# Patient Record
Sex: Female | Born: 1955 | Race: White | Hispanic: No | Marital: Married | Smoking: Former smoker
Health system: Southern US, Community
[De-identification: ages and names within clinical notes are randomized; demographics above are authoritative.]

## PROBLEM LIST (undated history)

## (undated) DIAGNOSIS — I471 Supraventricular tachycardia, unspecified: Secondary | ICD-10-CM

## (undated) DIAGNOSIS — Z87442 Personal history of urinary calculi: Secondary | ICD-10-CM

## (undated) DIAGNOSIS — I10 Essential (primary) hypertension: Secondary | ICD-10-CM

## (undated) DIAGNOSIS — K219 Gastro-esophageal reflux disease without esophagitis: Secondary | ICD-10-CM

## (undated) DIAGNOSIS — F329 Major depressive disorder, single episode, unspecified: Secondary | ICD-10-CM

## (undated) DIAGNOSIS — F32A Depression, unspecified: Secondary | ICD-10-CM

## (undated) DIAGNOSIS — M5030 Other cervical disc degeneration, unspecified cervical region: Secondary | ICD-10-CM

## (undated) DIAGNOSIS — E119 Type 2 diabetes mellitus without complications: Secondary | ICD-10-CM

## (undated) DIAGNOSIS — M1990 Unspecified osteoarthritis, unspecified site: Secondary | ICD-10-CM

## (undated) DIAGNOSIS — C44310 Basal cell carcinoma of skin of unspecified parts of face: Secondary | ICD-10-CM

## (undated) DIAGNOSIS — E7800 Pure hypercholesterolemia, unspecified: Secondary | ICD-10-CM

## (undated) DIAGNOSIS — M5126 Other intervertebral disc displacement, lumbar region: Secondary | ICD-10-CM

## (undated) DIAGNOSIS — I499 Cardiac arrhythmia, unspecified: Secondary | ICD-10-CM

## (undated) DIAGNOSIS — M797 Fibromyalgia: Secondary | ICD-10-CM

## (undated) DIAGNOSIS — M51369 Other intervertebral disc degeneration, lumbar region without mention of lumbar back pain or lower extremity pain: Secondary | ICD-10-CM

## (undated) DIAGNOSIS — I219 Acute myocardial infarction, unspecified: Secondary | ICD-10-CM

## (undated) DIAGNOSIS — K644 Residual hemorrhoidal skin tags: Secondary | ICD-10-CM

## (undated) DIAGNOSIS — F419 Anxiety disorder, unspecified: Secondary | ICD-10-CM

## (undated) DIAGNOSIS — N816 Rectocele: Secondary | ICD-10-CM

## (undated) DIAGNOSIS — M5136 Other intervertebral disc degeneration, lumbar region: Secondary | ICD-10-CM

## (undated) DIAGNOSIS — N390 Urinary tract infection, site not specified: Secondary | ICD-10-CM

## (undated) DIAGNOSIS — IMO0002 Reserved for concepts with insufficient information to code with codable children: Secondary | ICD-10-CM

## (undated) DIAGNOSIS — G4730 Sleep apnea, unspecified: Secondary | ICD-10-CM

## (undated) DIAGNOSIS — Q8909 Congenital malformations of spleen: Secondary | ICD-10-CM

## (undated) HISTORY — DX: Type 2 diabetes mellitus without complications: E11.9

## (undated) HISTORY — DX: Rectocele: N81.6

## (undated) HISTORY — DX: Congenital malformations of spleen: Q89.09

## (undated) HISTORY — DX: Anxiety disorder, unspecified: F41.9

## (undated) HISTORY — DX: Pure hypercholesterolemia, unspecified: E78.00

## (undated) HISTORY — DX: Acute myocardial infarction, unspecified: I21.9

## (undated) HISTORY — DX: Essential (primary) hypertension: I10

## (undated) HISTORY — DX: Sleep apnea, unspecified: G47.30

## (undated) HISTORY — PX: CARDIAC CATHETERIZATION: SHX172

## (undated) HISTORY — DX: Unspecified osteoarthritis, unspecified site: M19.90

## (undated) HISTORY — PX: BASAL CELL CARCINOMA EXCISION: SHX1214

## (undated) HISTORY — PX: OTHER SURGICAL HISTORY: SHX169

## (undated) HISTORY — DX: Cardiac arrhythmia, unspecified: I49.9

## (undated) HISTORY — DX: Residual hemorrhoidal skin tags: K64.4

## (undated) HISTORY — DX: Basal cell carcinoma of skin of unspecified parts of face: C44.310

## (undated) HISTORY — DX: Other cervical disc degeneration, unspecified cervical region: M50.30

## (undated) HISTORY — DX: Urinary tract infection, site not specified: N39.0

## (undated) HISTORY — PX: VAGINAL HYSTERECTOMY: SUR661

## (undated) HISTORY — DX: Reserved for concepts with insufficient information to code with codable children: IMO0002

## (undated) HISTORY — PX: KIDNEY STONE SURGERY: SHX686

---

## 2002-09-25 HISTORY — PX: OTHER SURGICAL HISTORY: SHX169

## 2012-09-25 HISTORY — PX: COSMETIC SURGERY: SHX468

## 2013-01-24 ENCOUNTER — Ambulatory Visit: Payer: Self-pay | Admitting: Emergency Medicine

## 2013-02-04 ENCOUNTER — Ambulatory Visit: Payer: Self-pay

## 2013-02-04 LAB — CBC WITH DIFFERENTIAL/PLATELET
Basophil #: 0 10*3/uL (ref 0.0–0.1)
Eosinophil #: 0.4 10*3/uL (ref 0.0–0.7)
HGB: 11.3 g/dL — ABNORMAL LOW (ref 12.0–16.0)
Lymphocyte #: 2.6 10*3/uL (ref 1.0–3.6)
Monocyte #: 0.8 x10 3/mm (ref 0.2–0.9)
Monocyte %: 10.9 %
Neutrophil #: 3.3 10*3/uL (ref 1.4–6.5)
Neutrophil %: 46.4 %
RDW: 12.7 % (ref 11.5–14.5)

## 2013-02-04 LAB — COMPREHENSIVE METABOLIC PANEL
Albumin: 3.9 g/dL (ref 3.4–5.0)
Anion Gap: 8 (ref 7–16)
BUN: 15 mg/dL (ref 7–18)
Co2: 30 mmol/L (ref 21–32)
Creatinine: 1.15 mg/dL (ref 0.60–1.30)
EGFR (Non-African Amer.): 54 — ABNORMAL LOW
Osmolality: 282 (ref 275–301)
Potassium: 3.4 mmol/L — ABNORMAL LOW (ref 3.5–5.1)
SGOT(AST): 23 U/L (ref 15–37)
SGPT (ALT): 24 U/L (ref 12–78)
Sodium: 141 mmol/L (ref 136–145)
Total Protein: 7.4 g/dL (ref 6.4–8.2)

## 2013-02-04 LAB — MAGNESIUM: Magnesium: 1.7 mg/dL — ABNORMAL LOW

## 2013-02-06 DIAGNOSIS — M169 Osteoarthritis of hip, unspecified: Secondary | ICD-10-CM | POA: Insufficient documentation

## 2013-02-13 DIAGNOSIS — E119 Type 2 diabetes mellitus without complications: Secondary | ICD-10-CM | POA: Insufficient documentation

## 2013-02-13 DIAGNOSIS — Z8349 Family history of other endocrine, nutritional and metabolic diseases: Secondary | ICD-10-CM | POA: Insufficient documentation

## 2013-04-17 ENCOUNTER — Ambulatory Visit: Payer: Self-pay | Admitting: Internal Medicine

## 2013-05-10 ENCOUNTER — Ambulatory Visit: Payer: Self-pay | Admitting: Family Medicine

## 2013-05-10 LAB — CBC WITH DIFFERENTIAL/PLATELET
Basophil #: 0 10*3/uL (ref 0.0–0.1)
Basophil %: 0.4 %
Eosinophil #: 0.5 10*3/uL (ref 0.0–0.7)
HGB: 12.1 g/dL (ref 12.0–16.0)
Lymphocyte %: 37.5 %
MCH: 30.6 pg (ref 26.0–34.0)
MCHC: 34.2 g/dL (ref 32.0–36.0)
Neutrophil #: 3.4 10*3/uL (ref 1.4–6.5)
RBC: 3.94 10*6/uL (ref 3.80–5.20)
WBC: 7.1 10*3/uL (ref 3.6–11.0)

## 2013-05-10 LAB — COMPREHENSIVE METABOLIC PANEL
Albumin: 4.3 g/dL (ref 3.4–5.0)
Alkaline Phosphatase: 49 U/L — ABNORMAL LOW (ref 50–136)
BUN: 25 mg/dL — ABNORMAL HIGH (ref 7–18)
Bilirubin,Total: 0.3 mg/dL (ref 0.2–1.0)
Calcium, Total: 9.9 mg/dL (ref 8.5–10.1)
Co2: 31 mmol/L (ref 21–32)
EGFR (African American): 46 — ABNORMAL LOW
EGFR (Non-African Amer.): 40 — ABNORMAL LOW
Potassium: 4 mmol/L (ref 3.5–5.1)
SGOT(AST): 20 U/L (ref 15–37)
Sodium: 139 mmol/L (ref 136–145)

## 2013-05-10 LAB — URINALYSIS, COMPLETE
Ph: 8 (ref 4.5–8.0)
Protein: NEGATIVE
Specific Gravity: 1.01 (ref 1.003–1.030)

## 2013-05-10 LAB — OCCULT BLOOD X 1 CARD TO LAB, STOOL: Occult Blood, Feces: NEGATIVE

## 2013-05-12 LAB — STOOL CULTURE

## 2013-06-26 ENCOUNTER — Ambulatory Visit: Payer: Self-pay

## 2013-06-26 ENCOUNTER — Emergency Department: Payer: Self-pay | Admitting: Emergency Medicine

## 2013-08-17 ENCOUNTER — Emergency Department: Payer: Self-pay | Admitting: Emergency Medicine

## 2013-08-17 LAB — CBC
HCT: 36.6 % (ref 35.0–47.0)
HGB: 12.6 g/dL (ref 12.0–16.0)
MCHC: 34.3 g/dL (ref 32.0–36.0)
MCV: 90 fL (ref 80–100)
Platelet: 330 10*3/uL (ref 150–440)
WBC: 12.7 10*3/uL — ABNORMAL HIGH (ref 3.6–11.0)

## 2013-08-17 LAB — COMPREHENSIVE METABOLIC PANEL
Albumin: 4.1 g/dL (ref 3.4–5.0)
BUN: 25 mg/dL — ABNORMAL HIGH (ref 7–18)
Bilirubin,Total: 0.3 mg/dL (ref 0.2–1.0)
Calcium, Total: 9.3 mg/dL (ref 8.5–10.1)
Chloride: 106 mmol/L (ref 98–107)
Co2: 28 mmol/L (ref 21–32)
EGFR (African American): 49 — ABNORMAL LOW
EGFR (Non-African Amer.): 43 — ABNORMAL LOW
Potassium: 3.2 mmol/L — ABNORMAL LOW (ref 3.5–5.1)
SGOT(AST): 28 U/L (ref 15–37)
SGPT (ALT): 27 U/L (ref 12–78)
Sodium: 139 mmol/L (ref 136–145)
Total Protein: 7.7 g/dL (ref 6.4–8.2)

## 2013-09-01 ENCOUNTER — Emergency Department: Payer: Self-pay | Admitting: Emergency Medicine

## 2013-09-01 LAB — COMPREHENSIVE METABOLIC PANEL
Albumin: 3.9 g/dL (ref 3.4–5.0)
Alkaline Phosphatase: 37 U/L — ABNORMAL LOW
BUN: 25 mg/dL — ABNORMAL HIGH (ref 7–18)
Calcium, Total: 9.5 mg/dL (ref 8.5–10.1)
EGFR (Non-African Amer.): 52 — ABNORMAL LOW
Glucose: 87 mg/dL (ref 65–99)
Osmolality: 281 (ref 275–301)
SGPT (ALT): 19 U/L (ref 12–78)
Sodium: 139 mmol/L (ref 136–145)
Total Protein: 7.6 g/dL (ref 6.4–8.2)

## 2013-09-01 LAB — CK TOTAL AND CKMB (NOT AT ARMC): CK-MB: 0.7 ng/mL (ref 0.5–3.6)

## 2013-09-01 LAB — URINALYSIS, COMPLETE
Bacteria: NONE SEEN
Bilirubin,UR: NEGATIVE
Blood: NEGATIVE
Glucose,UR: NEGATIVE mg/dL (ref 0–75)
Ketone: NEGATIVE
Nitrite: NEGATIVE
Ph: 7 (ref 4.5–8.0)
Squamous Epithelial: 1
WBC UR: 5 /HPF (ref 0–5)

## 2013-09-01 LAB — CBC
MCH: 31.2 pg (ref 26.0–34.0)
MCHC: 34.6 g/dL (ref 32.0–36.0)
RBC: 3.84 10*6/uL (ref 3.80–5.20)
RDW: 13.6 % (ref 11.5–14.5)
WBC: 7.1 10*3/uL (ref 3.6–11.0)

## 2013-09-01 LAB — TROPONIN I: Troponin-I: 0.02 ng/mL

## 2014-04-04 ENCOUNTER — Emergency Department: Payer: Self-pay | Admitting: Emergency Medicine

## 2014-04-30 ENCOUNTER — Ambulatory Visit: Payer: Self-pay | Admitting: Internal Medicine

## 2014-06-15 ENCOUNTER — Emergency Department: Payer: Self-pay | Admitting: Emergency Medicine

## 2014-07-06 ENCOUNTER — Ambulatory Visit: Payer: Self-pay | Admitting: Gastroenterology

## 2014-07-07 ENCOUNTER — Emergency Department: Payer: Self-pay | Admitting: Emergency Medicine

## 2014-07-07 LAB — CBC WITH DIFFERENTIAL/PLATELET
Basophil #: 0 10*3/uL (ref 0.0–0.1)
Basophil %: 0.3 %
EOS ABS: 0.3 10*3/uL (ref 0.0–0.7)
EOS PCT: 4.1 %
HCT: 36.6 % (ref 35.0–47.0)
HGB: 11.7 g/dL — ABNORMAL LOW (ref 12.0–16.0)
LYMPHS PCT: 34.8 %
Lymphocyte #: 2.9 10*3/uL (ref 1.0–3.6)
MCH: 28.3 pg (ref 26.0–34.0)
MCHC: 32.1 g/dL (ref 32.0–36.0)
MCV: 88 fL (ref 80–100)
MONOS PCT: 8.2 %
Monocyte #: 0.7 x10 3/mm (ref 0.2–0.9)
Neutrophil #: 4.4 10*3/uL (ref 1.4–6.5)
Neutrophil %: 52.6 %
Platelet: 292 10*3/uL (ref 150–440)
RBC: 4.15 10*6/uL (ref 3.80–5.20)
RDW: 13.7 % (ref 11.5–14.5)
WBC: 8.4 10*3/uL (ref 3.6–11.0)

## 2014-07-07 LAB — URINALYSIS, COMPLETE
BLOOD: NEGATIVE
Bilirubin,UR: NEGATIVE
Glucose,UR: NEGATIVE mg/dL (ref 0–75)
KETONE: NEGATIVE
LEUKOCYTE ESTERASE: NEGATIVE
NITRITE: NEGATIVE
PH: 5 (ref 4.5–8.0)
PROTEIN: NEGATIVE
RBC,UR: 2 /HPF (ref 0–5)
Specific Gravity: 1.026 (ref 1.003–1.030)

## 2014-07-07 LAB — TROPONIN I: Troponin-I: <0.02 ng/mL

## 2014-07-07 LAB — COMPREHENSIVE METABOLIC PANEL
ALK PHOS: 60 U/L
Albumin: 3.6 g/dL (ref 3.4–5.0)
Anion Gap: 10 (ref 7–16)
BUN: 21 mg/dL — AB (ref 7–18)
Bilirubin,Total: 0.3 mg/dL (ref 0.2–1.0)
Calcium, Total: 8.5 mg/dL (ref 8.5–10.1)
Chloride: 107 mmol/L (ref 98–107)
Co2: 26 mmol/L (ref 21–32)
Creatinine: 1.05 mg/dL (ref 0.60–1.30)
EGFR (African American): >60
GFR CALC NON AF AMER: 58 — AB
GLUCOSE: 102 mg/dL — AB (ref 65–99)
Osmolality: 288 (ref 275–301)
POTASSIUM: 3.2 mmol/L — AB (ref 3.5–5.1)
SGOT(AST): 18 U/L (ref 15–37)
SGPT (ALT): 21 U/L
Sodium: 143 mmol/L (ref 136–145)
Total Protein: 6.9 g/dL (ref 6.4–8.2)

## 2014-07-07 LAB — D-DIMER(ARMC): D-Dimer: 583 ng/ml

## 2014-07-07 LAB — CK TOTAL AND CKMB (NOT AT ARMC)
CK, Total: 104 U/L
CK-MB: 1.5 ng/mL (ref 0.5–3.6)

## 2014-07-07 LAB — PROTIME-INR
INR: 1
Prothrombin Time: 13 secs (ref 11.5–14.7)

## 2014-08-05 ENCOUNTER — Emergency Department: Payer: Self-pay | Admitting: Emergency Medicine

## 2014-09-21 ENCOUNTER — Emergency Department: Payer: Self-pay | Admitting: Emergency Medicine

## 2014-09-21 LAB — DRUG SCREEN, URINE
AMPHETAMINES, UR SCREEN: NEGATIVE (ref ?–1000)
BARBITURATES, UR SCREEN: NEGATIVE (ref ?–200)
BENZODIAZEPINE, UR SCRN: POSITIVE (ref ?–200)
CANNABINOID 50 NG, UR ~~LOC~~: NEGATIVE (ref ?–50)
Cocaine Metabolite,Ur ~~LOC~~: NEGATIVE (ref ?–300)
MDMA (Ecstasy)Ur Screen: NEGATIVE (ref ?–500)
Methadone, Ur Screen: NEGATIVE (ref ?–300)
Opiate, Ur Screen: POSITIVE (ref ?–300)
PHENCYCLIDINE (PCP) UR S: NEGATIVE (ref ?–25)
TRICYCLIC, UR SCREEN: POSITIVE (ref ?–1000)

## 2014-09-21 LAB — URINALYSIS, COMPLETE
BLOOD: NEGATIVE
Bilirubin,UR: NEGATIVE
Glucose,UR: NEGATIVE mg/dL (ref 0–75)
Ketone: NEGATIVE
Nitrite: NEGATIVE
PH: 8 (ref 4.5–8.0)
Protein: NEGATIVE
RBC,UR: 5 /HPF (ref 0–5)
SPECIFIC GRAVITY: 1.016 (ref 1.003–1.030)
WBC UR: 10 /HPF (ref 0–5)

## 2014-09-23 ENCOUNTER — Emergency Department: Payer: Self-pay | Admitting: Emergency Medicine

## 2014-09-27 ENCOUNTER — Emergency Department (HOSPITAL_COMMUNITY): Payer: PPO

## 2014-09-27 ENCOUNTER — Encounter (HOSPITAL_COMMUNITY): Payer: Self-pay | Admitting: Emergency Medicine

## 2014-09-27 ENCOUNTER — Emergency Department (HOSPITAL_COMMUNITY)
Admission: EM | Admit: 2014-09-27 | Discharge: 2014-09-27 | Disposition: A | Discharge status: Home / Self Care | Payer: PPO | Attending: Emergency Medicine | Admitting: Emergency Medicine

## 2014-09-27 DIAGNOSIS — Z7952 Long term (current) use of systemic steroids: Secondary | ICD-10-CM | POA: Insufficient documentation

## 2014-09-27 DIAGNOSIS — Z79899 Other long term (current) drug therapy: Secondary | ICD-10-CM | POA: Diagnosis not present

## 2014-09-27 DIAGNOSIS — Z87442 Personal history of urinary calculi: Secondary | ICD-10-CM | POA: Insufficient documentation

## 2014-09-27 DIAGNOSIS — I471 Supraventricular tachycardia: Secondary | ICD-10-CM | POA: Insufficient documentation

## 2014-09-27 DIAGNOSIS — M545 Low back pain, unspecified: Secondary | ICD-10-CM

## 2014-09-27 DIAGNOSIS — R109 Unspecified abdominal pain: Secondary | ICD-10-CM | POA: Insufficient documentation

## 2014-09-27 DIAGNOSIS — Z792 Long term (current) use of antibiotics: Secondary | ICD-10-CM | POA: Insufficient documentation

## 2014-09-27 DIAGNOSIS — R10A1 Flank pain, right side: Secondary | ICD-10-CM

## 2014-09-27 HISTORY — DX: Supraventricular tachycardia: I47.1

## 2014-09-27 HISTORY — DX: Supraventricular tachycardia, unspecified: I47.10

## 2014-09-27 LAB — URINALYSIS, ROUTINE W REFLEX MICROSCOPIC
Bilirubin Urine: NEGATIVE
Glucose, UA: NEGATIVE mg/dL
Hgb urine dipstick: NEGATIVE
Ketones, ur: NEGATIVE mg/dL
Nitrite: NEGATIVE
PH: 8 (ref 5.0–8.0)
Protein, ur: NEGATIVE mg/dL
Specific Gravity, Urine: 1.02 (ref 1.005–1.030)
Urobilinogen, UA: 0.2 mg/dL (ref 0.0–1.0)

## 2014-09-27 LAB — CBC WITH DIFFERENTIAL/PLATELET
BASOS PCT: 0 % (ref 0–1)
Basophils Absolute: 0 10*3/uL (ref 0.0–0.1)
EOS ABS: 0.4 10*3/uL (ref 0.0–0.7)
EOS PCT: 5 % (ref 0–5)
HCT: 43.3 % (ref 36.0–46.0)
HEMOGLOBIN: 14.4 g/dL (ref 12.0–15.0)
Lymphocytes Relative: 30 % (ref 12–46)
Lymphs Abs: 2.9 10*3/uL (ref 0.7–4.0)
MCH: 29 pg (ref 26.0–34.0)
MCHC: 33.3 g/dL (ref 30.0–36.0)
MCV: 87.1 fL (ref 78.0–100.0)
MONOS PCT: 8 % (ref 3–12)
Monocytes Absolute: 0.8 10*3/uL (ref 0.1–1.0)
Neutro Abs: 5.5 10*3/uL (ref 1.7–7.7)
Neutrophils Relative %: 57 % (ref 43–77)
PLATELETS: 317 10*3/uL (ref 150–400)
RBC: 4.97 MIL/uL (ref 3.87–5.11)
RDW: 13.6 % (ref 11.5–15.5)
WBC: 9.7 10*3/uL (ref 4.0–10.5)

## 2014-09-27 LAB — COMPREHENSIVE METABOLIC PANEL
ALT: 20 U/L (ref 0–35)
AST: 29 U/L (ref 0–37)
Albumin: 4.3 g/dL (ref 3.5–5.2)
Alkaline Phosphatase: 53 U/L (ref 39–117)
Anion gap: 11 (ref 5–15)
BILIRUBIN TOTAL: 0.4 mg/dL (ref 0.3–1.2)
BUN: 18 mg/dL (ref 6–23)
CALCIUM: 10.1 mg/dL (ref 8.4–10.5)
CO2: 26 mmol/L (ref 19–32)
CREATININE: 1.13 mg/dL — AB (ref 0.50–1.10)
Chloride: 101 mEq/L (ref 96–112)
GFR, EST AFRICAN AMERICAN: 61 mL/min — AB (ref >90)
GFR, EST NON AFRICAN AMERICAN: 53 mL/min — AB (ref >90)
Glucose, Bld: 102 mg/dL — ABNORMAL HIGH (ref 70–99)
Potassium: 3.9 mmol/L (ref 3.5–5.1)
SODIUM: 138 mmol/L (ref 135–145)
Total Protein: 7.7 g/dL (ref 6.0–8.3)

## 2014-09-27 LAB — I-STAT TROPONIN, ED: TROPONIN I, POC: 0 ng/mL (ref 0.00–0.08)

## 2014-09-27 LAB — URINE MICROSCOPIC-ADD ON

## 2014-09-27 MED ORDER — KETOROLAC TROMETHAMINE 15 MG/ML IJ SOLN
15.0000 mg | Freq: Once | INTRAMUSCULAR | Status: AC
  Administered 2014-09-27: 15 mg via INTRAVENOUS
  Filled 2014-09-27: qty 1

## 2014-09-27 MED ORDER — HYDROMORPHONE HCL 1 MG/ML IJ SOLN
1.0000 mg | Freq: Once | INTRAMUSCULAR | Status: AC
  Administered 2014-09-27: 1 mg via INTRAVENOUS
  Filled 2014-09-27: qty 1

## 2014-09-27 MED ORDER — HYDROMORPHONE HCL 1 MG/ML IJ SOLN: 1.0000 mg | Freq: Once | INTRAMUSCULAR | Status: DC

## 2014-09-27 MED ORDER — SODIUM CHLORIDE 0.9 % IV BOLUS (SEPSIS)
1000.0000 mL | Freq: Once | INTRAVENOUS | Status: AC
  Administered 2014-09-27: 1000 mL via INTRAVENOUS

## 2014-09-27 MED ORDER — OXYCODONE-ACETAMINOPHEN 5-325 MG PO TABS
1.0000 | ORAL_TABLET | Freq: Once | ORAL | Status: AC
  Administered 2014-09-27: 1 via ORAL
  Filled 2014-09-27: qty 1

## 2014-09-27 MED ORDER — OXYCODONE-ACETAMINOPHEN 5-325 MG PO TABS: 1.0000 | ORAL_TABLET | Freq: Four times a day (QID) | ORAL | Status: DC | PRN

## 2014-09-27 NOTE — ED Provider Notes (Signed)
CSN: [id number]     Arrival date & time 09/27/14  1154 History   None    Chief Complaint  Patient presents with  . Back Pain  . Flank Pain  . Palpitations     (Consider location/radiation/quality/duration/timing/severity/associated sxs/prior Treatment) Patient is a 59 y.o. female presenting with back pain, flank pain, and palpitations. The history is provided by the patient and the spouse.  Back Pain Location:  Lumbar spine Quality:  Shooting (sharp) Radiates to: right flank. Pain severity:  Severe Pain is:  Same all the time Onset quality:  Gradual Duration:  4 days Timing:  Constant Progression:  Worsening Chronicity:  Recurrent (patient has hx of back pain and kidney stones and states this pain feels the same as her previous kidney stones) Relieved by:  Nothing Exacerbated by: urination. Associated symptoms: dysuria (sharp pain on urination similar to previous stones, no burning )   Associated symptoms: no abdominal pain, no bladder incontinence, no bowel incontinence, no chest pain, no fever, no headaches, no leg pain, no perianal numbness and no weakness   Flank Pain Pertinent negatives include no abdominal pain, arthralgias, chest pain, chills, coughing, diaphoresis, fatigue, fever, headaches, myalgias, nausea, rash, sore throat, vomiting or weakness.  Palpitations Palpitations quality:  Regular Onset quality:  Gradual Duration:  1 day Timing:  Constant Chronicity:  Recurrent Associated symptoms: back pain   Associated symptoms: no chest pain, no cough, no diaphoresis, no dizziness, no leg pain, no nausea, no shortness of breath and no vomiting     Past Medical History  Diagnosis Date  . Kidney stone   . SVT (supraventricular tachycardia)    History reviewed. No pertinent past surgical history. History reviewed. No pertinent family history. History  Substance Use Topics  . Smoking status: Never Smoker   . Smokeless tobacco: Not on file  . Alcohol Use: No   OB  History    No data available     Review of Systems  Constitutional: Negative for fever, chills, diaphoresis, activity change, appetite change and fatigue.  HENT: Negative for facial swelling, rhinorrhea, sore throat, trouble swallowing and voice change.   Eyes: Negative for photophobia, pain and visual disturbance.  Respiratory: Negative for cough, shortness of breath, wheezing and stridor.   Cardiovascular: Positive for palpitations. Negative for chest pain and leg swelling.  Gastrointestinal: Negative for nausea, vomiting, abdominal pain, constipation, anal bleeding and bowel incontinence.  Endocrine: Negative.   Genitourinary: Positive for dysuria (sharp pain on urination similar to previous stones, no burning ) and flank pain. Negative for bladder incontinence, vaginal bleeding, vaginal discharge and vaginal pain.  Musculoskeletal: Positive for back pain. Negative for myalgias and arthralgias.  Skin: Negative.  Negative for rash.  Allergic/Immunologic: Negative.   Neurological: Negative for dizziness, tremors, syncope, weakness and headaches.  Psychiatric/Behavioral: Negative for suicidal ideas, sleep disturbance and self-injury.  All other systems reviewed and are negative.     Allergies  Demerol; Ivp dye; and Sulfa antibiotics  Home Medications   Prior to Admission medications   Medication Sig Start Date End Date Taking? Authorizing Provider  atenolol (TENORMIN) 25 MG tablet Take 25 mg by mouth daily. 09/20/14   Historical Provider, MD  clonazePAM (KLONOPIN) 0.5 MG tablet Take 0.5 mg by mouth 3 (three) times daily as needed. 09/21/14   Historical Provider, MD  fenofibrate (TRICOR) 145 MG tablet Take 145 mg by mouth daily. 08/06/14   Historical Provider, MD  gabapentin (NEURONTIN) 300 MG capsule Take 300 mg by mouth 3 (three)  times daily. 08/06/14   Historical Provider, MD  HYDROcodone-acetaminophen (NORCO) 10-325 MG per tablet Take 1 tablet by mouth 2 (two) times daily as  needed. 08/15/14   Historical Provider, MD  KLOR-CON M20 20 MEQ tablet Take 20 mEq by mouth daily. 08/06/14   Historical Provider, MD  losartan (COZAAR) 50 MG tablet Take 50 mg by mouth daily. 09/01/14   Historical Provider, MD  metFORMIN (GLUCOPHAGE-XR) 500 MG 24 hr tablet Take 1,000 mg by mouth daily. 08/06/14   Historical Provider, MD  methocarbamol (ROBAXIN) 750 MG tablet Take 750 mg by mouth every 4 (four) hours as needed. 09/24/14   Historical Provider, MD  niacin (NIASPAN) 500 MG CR tablet Take 500 mg by mouth at bedtime. 08/15/14   Historical Provider, MD  nitrofurantoin, macrocrystal-monohydrate, (MACROBID) 100 MG capsule Take 100 mg by mouth 2 (two) times daily. 09/22/14   Historical Provider, MD  nortriptyline (PAMELOR) 75 MG capsule Take 150 mg by mouth every evening. 09/15/14   Historical Provider, MD  oxyCODONE-acetaminophen (PERCOCET/ROXICET) 5-325 MG per tablet Take 1 tablet by mouth every 6 (six) hours as needed for severe pain. 09/27/14   Margaretann Loveless, MD  predniSONE (DELTASONE) 20 MG tablet Take 20 mg by mouth 2 (two) times daily. 09/22/14   Historical Provider, MD  promethazine (PHENERGAN) 25 MG tablet Take 25 mg by mouth every 6 (six) hours as needed. 09/21/14   Historical Provider, MD  sertraline (ZOLOFT) 50 MG tablet Take 50 mg by mouth daily. 08/21/14   Historical Provider, MD  temazepam (RESTORIL) 30 MG capsule Take 30 mg by mouth at bedtime. 09/21/14   Historical Provider, MD  triamterene-hydrochlorothiazide (MAXZIDE-25) 37.5-25 MG per tablet Take 1 tablet by mouth every morning. 08/06/14   Historical Provider, MD   BP 123/70 mmHg  Pulse 86  Temp(Src) 98.6 F (37 C) (Oral)  Resp 18  SpO2 98% Physical Exam  Constitutional: She is oriented to person, place, and time. She appears well-developed and well-nourished. No distress.  HENT:  Head: Normocephalic and atraumatic.  Right Ear: External ear normal.  Left Ear: External ear normal.  Mouth/Throat: Oropharynx is clear  and moist. No oropharyngeal exudate.  Eyes: Conjunctivae and EOM are normal. Pupils are equal, round, and reactive to light. No scleral icterus.  Neck: Normal range of motion. Neck supple. No JVD present. No tracheal deviation present. No thyromegaly present.  Cardiovascular: Normal rate, regular rhythm and intact distal pulses.  Exam reveals no gallop and no friction rub.   No murmur heard. Pulmonary/Chest: Effort normal and breath sounds normal. No respiratory distress. She has no wheezes. She has no rales.  Abdominal: Soft. Bowel sounds are normal. She exhibits no distension. There is tenderness (Right sided CVA tenderness). There is no rebound and no guarding.  Musculoskeletal: Normal range of motion. She exhibits tenderness (right paraspinal lumbar back tenderness). She exhibits no edema.  Neurological: She is alert and oriented to person, place, and time. No cranial nerve deficit. She exhibits normal muscle tone. Coordination normal.  5/5 strength in all 4 extremities. Normal Gait. No saddle anesthesia.   Skin: Skin is warm and dry. She is not diaphoretic. No pallor.  Psychiatric: She has a normal mood and affect. She expresses no homicidal and no suicidal ideation. She expresses no suicidal plans and no homicidal plans.  Nursing note and vitals reviewed.   ED Course  Procedures (including critical care time) Labs Review Labs Reviewed  COMPREHENSIVE METABOLIC PANEL - Abnormal; Notable for the following:  Glucose, Bld 102 (*)    Creatinine, Ser 1.13 (*)    GFR calc non Af Amer 53 (*)    GFR calc Af Amer 61 (*)    All other components within normal limits  URINALYSIS, ROUTINE W REFLEX MICROSCOPIC - Abnormal; Notable for the following:    Leukocytes, UA MODERATE (*)    All other components within normal limits  URINE MICROSCOPIC-ADD ON - Abnormal; Notable for the following:    Squamous Epithelial / LPF FEW (*)    All other components within normal limits  CBC WITH DIFFERENTIAL   Randolm Idol, ED    Imaging Review US Renal  09/27/2014   CLINICAL DATA:  Four day history of right flank pain. Prior history of urinary tract calculi.  EXAM: RENAL/URINARY TRACT ULTRASOUND COMPLETE  COMPARISON:  Visualized kidneys on lumbar spine CT 09/23/2014.  FINDINGS: Right Kidney:  Length: Approximately 11.8 cm. No hydronephrosis. Well-preserved cortex. No shadowing calculi. Normal parenchymal echotexture. No focal parenchymal abnormality. 4 mm hyperechoic focus in the central kidney is felt to represent fat, as a calculus was not visible on the CT performed 5 days ago.  Left Kidney:  Length: Approximately 11.6 cm. No hydronephrosis. Well-preserved cortex. No shadowing calculi. Normal parenchymal echotexture. No focal parenchymal abnormality.  Bladder:  Relatively decompressed and normal in appearance. Bilateral ureteral jets visualized a color Doppler evaluation.  IMPRESSION: Normal examination.   Electronically Signed   By: Evangeline Dakin M.D.   On: 09/27/2014 16:35     EKG Interpretation None      MDM   Final diagnoses:  Right flank pain  Right-sided low back pain without sciatica    The patient is a 59 y.o. F with hx of lumbar disc compression (currently being seen by [facility]), multiple right sided kidney stones (one of which needed intervention approximately 3 years ago), and SVT who presents with multiple chief complaints including lumbar back pain, right flank pain, and palpitations. EKG shows sinus tachycardia likely due to patients pain and patient denies any SOB, CP, or neurologic symptoms. Troponin undetectable. Do not suspect ACS or other acute cardiovascular pathology. Back pain is right paraspinal, patient has no redflag symptoms or neurologic complaints and I do not suspect cord compression. Patient states this feels "the same" as her multiple previous kidney stones and cites urination as the main aggravating factor to there sharp right flank  pain.  Labs unremarkable. Renal ultrasound does not show any hydronephrosis or other acute abnormality. Patient's pain well controlled in the ED with improvement of heart rate normal sinus rhythm. Feel patient is appropriate for outpatient follow-up with neurosurgery clinic for back and urology for likely kidney stone.  I estimate there is LOW risk for ACUTE APPENDICITIS, BOWEL OBSTRUCTION, CHOLECYSTITIS, DIVERTICULITIS, INCARCERATED HERNIA, MESENTERIC ISCHEMIA, PANCREATITIS, or PERFORATED BOWEL or ULCER, thus I consider the discharge disposition reasonable. Also, there is no evidence or peritonitis, sepsis or toxicity. We have discussed the diagnosis and risks and agree with discharging home to follow-up with their primary doctor. We also discussed returning to the Emergency Department immediately if new or worsening symptoms occur and have discussed the symptoms which are most concerning (e.g., bloody stool, fever, changing or worsening pain, vomiting) that necessitate immediate return.  Patient seen with attending, Dr. Vanita Panda, who oversaw clinical decision making.    Margaretann Loveless, MD 09/27/14 Lurena Nida  Carmin Muskrat, MD 09/28/14 Lupita Shutter

## 2014-09-27 NOTE — ED Notes (Signed)
Pt sts currently being seen for lumbar disc compression and is part of the cause of the pain in her hip/leg.

## 2014-09-27 NOTE — ED Notes (Signed)
Pt c/o right sided flank and back pain x 4 days; pt sts felt some palpitations in the waiting room with hx of SVT

## 2014-09-27 NOTE — ED Notes (Signed)
Pt c/o desire to urinate but unable to create much urine.  Used bladder scanner and discovered 250-373ml of urine in bladder.  Informed pt to let me know if she gets the urge again and we will try to urinate again.

## 2014-10-25 ENCOUNTER — Ambulatory Visit: Payer: Self-pay | Admitting: Family Medicine

## 2014-11-20 DIAGNOSIS — E782 Mixed hyperlipidemia: Secondary | ICD-10-CM | POA: Insufficient documentation

## 2014-11-20 DIAGNOSIS — F419 Anxiety disorder, unspecified: Secondary | ICD-10-CM | POA: Insufficient documentation

## 2014-11-20 DIAGNOSIS — M5136 Other intervertebral disc degeneration, lumbar region: Secondary | ICD-10-CM | POA: Insufficient documentation

## 2015-01-15 NOTE — Consult Note (Signed)
PATIENT NAME:  Erin Campos, Erin Campos MR#:  [id number] DATE OF BIRTH:  [birth year]  DATE OF CONSULTATION:  06/26/2013  REFERRING PHYSICIAN:  Delman Kitten, MD CONSULTING PHYSICIAN:  Jerene Bears, MD  REASON FOR CONSULTATION:  Laceration from dog bite.   HISTORY OF PRESENT ILLNESS: The patient is a 59 year old female who was at home around 5:00 p.m. this afternoon and gotten bitten by her small dog.  She had loss of tissue and avulsion injury with loss of tissue of her upper lip near her philtrum. She was seen at [facility] and noted to require further management beyond their capabilities and was sent to [facility] ER for closure.   ALLERGIES: DEMEROL, INTRAVENOUS PYELOGRAM DYE, KETOROLAC, LIDOCAINE AND SULFA.   PAST MEDICAL HISTORY:   1.  Supraventricular tachycardia.  2.  Hyper triglycerides.  3.  Hypercholesterolemia.  4.  Diabetes mellitus, type 2.  5.  Hypertension.  6.  Chronic back pain.  7.  Kyphosis.  8.  Spinal stenosis.   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    FAMILY HISTORY: Noncontributory.   PHYSICAL EXAMINATION: GENERAL: She is a well-nourished, well-developed female in no acute distress.  EARS: EACs are clear. TMs intact.  NOSE: Clear anteriorly.  ORAL CAVITY AND OROPHARYNX: She has missing defect of the middle of her upper lip from encompassing the majority of the entire philtrum extending into the skin of the upper lip. There is hemostasis. She has some vertical lacerations extending superior from this and some loss tissue, as well as a small amount of maceration of the inferior aspect of the wound on her upper lip. There are no additional abnormal masses or lesions or areas of concern in her intraoral area.  NECK: Supple. No lymphadenopathy.   PROCEDURE:  V to Y advancement flap closure of a laceration.   PREPROCEDURE DIAGNOSIS: Laceration of the upper lip with tissue defect.   POSTPROCEDURE DIAGNOSIS: Laceration of the upper  lip with tissue defect.    DESCRIPTION OF PROCEDURE:  After verbal consent was obtained, the patient's upper lip was prepped and draped in sterile fashion and anesthetized with 2 mL of 1% lidocaine 1:100,000 epinephrine. The area was cleaned with Betadine and then tissue rearrangement was attempted. This demonstrated a large defect of basically the entire philtrum of the upper lip and decision was made to an advancement flap of intraoral mucosa. At this time, dissection of mucosa of the remaining upper lip was made in the muscle and mucosal tissue flap was advanced using backcut of the tissue defect. This was advanced and then sutured in place in a multilayered fashion forming a Y closure. The cut edges of the native lip where the then sutured at the proper height laterally. The native lip was closed at the proper height and the vertical lacerations of the skin were closed with 5-0 nylon, 5-0 chromic was used for all other closures as well as 5-0 Vicryl for the subcutaneous tissues. At this time and bacitracin ointment was applied and the patient's was lip was visualized in the mirror. I explained the tissue defect and reconstruction.   IMPRESSION: Laceration, status post advancement flap repair.   PLAN: To follow up in one week for suture removal. She will continue bacitracin ointment, and she is going to take Augmentin.   ____________________________ Jerene Bears, MD ccv:cc D: 06/26/2013 21:40:27 ET T: 06/26/2013 22:24:54 ET JOB#: [id number]  cc: Jerene Bears, MD, <Dictator> Veva Grimley C  Pryor Ochoa MD ELECTRONICALLY SIGNED 07/09/2013 6:43

## 2015-03-19 DIAGNOSIS — I493 Ventricular premature depolarization: Secondary | ICD-10-CM | POA: Insufficient documentation

## 2015-03-31 ENCOUNTER — Ambulatory Visit: Payer: PPO | Attending: Specialist

## 2015-03-31 DIAGNOSIS — G4733 Obstructive sleep apnea (adult) (pediatric): Secondary | ICD-10-CM | POA: Diagnosis present

## 2015-03-31 DIAGNOSIS — G4761 Periodic limb movement disorder: Secondary | ICD-10-CM | POA: Insufficient documentation

## 2015-06-25 DIAGNOSIS — G4761 Periodic limb movement disorder: Secondary | ICD-10-CM | POA: Insufficient documentation

## 2015-06-25 DIAGNOSIS — J358 Other chronic diseases of tonsils and adenoids: Secondary | ICD-10-CM | POA: Insufficient documentation

## 2015-06-25 DIAGNOSIS — G43909 Migraine, unspecified, not intractable, without status migrainosus: Secondary | ICD-10-CM | POA: Insufficient documentation

## 2015-08-24 ENCOUNTER — Encounter: Payer: Self-pay | Admitting: Urology

## 2015-08-24 ENCOUNTER — Ambulatory Visit (INDEPENDENT_AMBULATORY_CARE_PROVIDER_SITE_OTHER): Payer: PPO | Admitting: Urology

## 2015-08-24 VITALS — BP 124/77 | HR 80 | Ht 67.0 in | Wt 201.5 lb

## 2015-08-24 DIAGNOSIS — N390 Urinary tract infection, site not specified: Secondary | ICD-10-CM | POA: Diagnosis not present

## 2015-08-24 DIAGNOSIS — N952 Postmenopausal atrophic vaginitis: Secondary | ICD-10-CM | POA: Diagnosis not present

## 2015-08-24 DIAGNOSIS — R3129 Other microscopic hematuria: Secondary | ICD-10-CM | POA: Diagnosis not present

## 2015-08-24 LAB — URINALYSIS, COMPLETE
Bilirubin, UA: NEGATIVE
Glucose, UA: NEGATIVE
Ketones, UA: NEGATIVE
Nitrite, UA: NEGATIVE
PH UA: 7 (ref 5.0–7.5)
Protein, UA: NEGATIVE
Specific Gravity, UA: 1.015 (ref 1.005–1.030)
Urobilinogen, Ur: 0.2 mg/dL (ref 0.2–1.0)

## 2015-08-24 LAB — MICROSCOPIC EXAMINATION
Bacteria, UA: NONE SEEN
RBC, UA: NONE SEEN /hpf (ref 0–?)

## 2015-08-24 NOTE — Progress Notes (Signed)
08/24/2015 4:23 PM   Erin Campos December 20, 1955 [id number]  Referring provider: No referring provider defined for this encounter.  Chief Complaint  Patient presents with  . Recurrent UTI    referred by Dr. 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    Her infections are herald by frequency, low back ache, headaches, chills and dysuria.  She states this started one year ago.  She was seen by her PCP and the UA was not impressive.  She was started on an antibiotic and the culture grew out Streptococcus.  Her symptoms have not abated and she has had three courses of Amoxicillin.  Her urine cultures have not been obtained by cath specimens.    She has not had episodes of gross hematuria, but she had 0-3 RBC's/hpf on three occassions.  She is a former smoker.  She does have a history of kidney stones.  She has had ureteroscopy for one of these stones.  She has a remote history of recurrent UTI's and has been evaluated by Dr. Marden Noble and Dr. Ria Comment at [facility].  She has signed a medical release for these records.  She does not recall an etiology for her recurrent UTI's from those work ups.    Her UA today was unremarkable.  She is not sexually active at this time.  She is not experiencing constipation or diarrhea.  PMH: Past Medical History  Diagnosis Date  . Kidney stone   . SVT (supraventricular tachycardia) ([place])   . Anxiety   . Arrhythmia   . Arthritis   . Facial basal cell cancer   . Spleen anomaly     compressed  . Controlled diabetes mellitus ([place])   . Heart attack ([place])   . High cholesterol   . HTN (hypertension)   . Sleep apnea     CPAP  . Recurrent UTI     Surgical History: Past Surgical History  Procedure Laterality Date  . Abdominal hysterectomy     . Heart ablation    . Kidney stone surgery      stents  . Cosmetic surgery  2014    Lip  . Basal cell carcinoma excision      face    Home Medications:    Medication List       This list is accurate as of: 08/24/15  4:23 PM.  Always use your most recent med list.               amoxicillin-clavulanate 875-125 MG tablet  Commonly known as:  AUGMENTIN  Take by mouth.     aspirin 81 MG chewable tablet  Chew by mouth.     atenolol 25 MG tablet  Commonly known as:  TENORMIN  Take 25 mg by mouth daily.     carisoprodol 350 MG tablet  Commonly known as:  SOMA  Take by mouth.     clonazePAM 0.5 MG tablet  Commonly known as:  KLONOPIN  Take 0.5 mg by mouth 3 (three) times daily as needed.     fenofibrate 145 MG tablet  Commonly known as:  TRICOR  Take 145 mg by mouth daily.     fluticasone 50 MCG/ACT nasal spray  Commonly known as:  FLONASE  Place into the nose.  gabapentin 300 MG capsule  Commonly known as:  NEURONTIN  Take 300 mg by mouth 3 (three) times daily.     glucose blood test strip     HYDROcodone-acetaminophen 10-325 MG tablet  Commonly known as:  NORCO  Take 1 tablet by mouth 2 (two) times daily as needed.     KLOR-CON M20 20 MEQ tablet  Generic drug:  potassium chloride SA  Take 20 mEq by mouth daily.     lisinopril 2.5 MG tablet  Commonly known as:  PRINIVIL,ZESTRIL  Take by mouth.     losartan 50 MG tablet  Commonly known as:  COZAAR  Take 50 mg by mouth daily.     Magnesium 500 MG Caps  Take by mouth.     magnesium oxide 400 MG tablet  Commonly known as:  MAG-OX  Take by mouth.     metFORMIN 500 MG 24 hr tablet  Commonly known as:  GLUCOPHAGE-XR  Take 1,000 mg by mouth daily.     methocarbamol 750 MG tablet  Commonly known as:  ROBAXIN  Take 750 mg by mouth every 4 (four) hours as needed.     niacin 500 MG CR tablet  Commonly known as:  NIASPAN  Take 500 mg by mouth at bedtime.     niacin 500 MG tablet  Take by mouth.      nitrofurantoin (macrocrystal-monohydrate) 100 MG capsule  Commonly known as:  MACROBID  Take 100 mg by mouth 2 (two) times daily.     nortriptyline 75 MG capsule  Commonly known as:  PAMELOR  Take 150 mg by mouth every evening.     NUCYNTA 75 MG Tabs  Generic drug:  Tapentadol HCl  Take by mouth.     oxyCODONE 5 MG immediate release tablet  Commonly known as:  Oxy IR/ROXICODONE  TAKE 1 TABLET BY ORAL ROUTE EVERY 6 HOURS AS NEEDED (DNF 07/22/15)     oxyCODONE-acetaminophen 5-325 MG tablet  Commonly known as:  PERCOCET/ROXICET  Take 1 tablet by mouth every 6 (six) hours as needed for severe pain.     predniSONE 20 MG tablet  Commonly known as:  DELTASONE  Take 20 mg by mouth 2 (two) times daily.     PROAIR HFA 108 (90 BASE) MCG/ACT inhaler  Generic drug:  albuterol  Inhale into the lungs.     promethazine 25 MG tablet  Commonly known as:  PHENERGAN  Take 25 mg by mouth every 6 (six) hours as needed.     RELPAX 20 MG tablet  Generic drug:  eletriptan  TAKE 1 TAB BY MOUTH ONCE AS NEEDED FOR MIGRAINE.IF HEADACHE RETURNS MAY TAKE 2ND DOSE AFTER 2 HOURS     rOPINIRole 0.25 MG tablet  Commonly known as:  REQUIP  TAKE 1 TABLET (0.25 MG TOTAL) BY MOUTH NIGHTLY.     sertraline 50 MG tablet  Commonly known as:  ZOLOFT  Take 50 mg by mouth daily.     temazepam 30 MG capsule  Commonly known as:  RESTORIL  Take 30 mg by mouth at bedtime.     temazepam 15 MG capsule  Commonly known as:  RESTORIL  Take 15 mg by mouth at bedtime as needed. for sleep     triamterene-hydrochlorothiazide 37.5-25 MG tablet  Commonly known as:  MAXZIDE-25  Take 1 tablet by mouth every morning.        Allergies:  Allergies  Allergen Reactions  . Demerol [Meperidine]   . Flu Virus Vaccine Other (  See Comments)    Seizure, Drawing up of hands/feet  . Ivp Dye [Iodinated Diagnostic Agents]   . Nsaids   . Sulfa Antibiotics   . Ketorolac Tromethamine Palpitations    palpitations     Family History: Family History  Problem Relation Age of Onset  . Kidney Stones Father   . Hematuria Father   . Heart failure Mother     Father    Social History:  reports that she has quit smoking. She does not have any smokeless tobacco history on file. She reports that she does not drink alcohol or use illicit drugs.  ROS: UROLOGY Frequent Urination?: Yes Hard to postpone urination?: Yes Burning/pain with urination?: Yes Get up at night to urinate?: No Leakage of urine?: No Urine stream starts and stops?: Yes Trouble starting stream?: Yes Do you have to strain to urinate?: Yes Blood in urine?: Yes Urinary tract infection?: Yes Sexually transmitted disease?: No Injury to kidneys or bladder?: No Painful intercourse?: Yes Weak stream?: Yes Currently pregnant?: No Vaginal bleeding?: No Last menstrual period?: n  Gastrointestinal Nausea?: Yes Vomiting?: No Indigestion/heartburn?: No Diarrhea?: No Constipation?: No  Constitutional Fever: Yes Night sweats?: No Weight loss?: No Fatigue?: Yes  Skin Skin rash/lesions?: No Itching?: No  Eyes Blurred vision?: No Double vision?: No  Ears/Nose/Throat Sore throat?: Yes Sinus problems?: Yes  Hematologic/Lymphatic Swollen glands?: No Easy bruising?: No  Cardiovascular Leg swelling?: No Chest pain?: No  Respiratory Cough?: Yes Shortness of breath?: No  Endocrine Excessive thirst?: No  Musculoskeletal Back pain?: Yes Joint pain?: Yes  Neurological Headaches?: Yes Dizziness?: No  Psychologic Depression?: No Anxiety?: No  Physical Exam: BP 124/77 mmHg  Pulse 80  Ht 5\' 7"  (1.702 m)  Wt 201 lb 8 oz (91.4 kg)  BMI 31.55 kg/m2  Constitutional: Well nourished. Alert and oriented, No acute distress. HEENT: [place] AT, moist mucus membranes. Trachea midline, no masses. Cardiovascular: No clubbing, cyanosis, or edema. Respiratory: Normal respiratory effort, no increased work of breathing. GI:  Abdomen is soft, non tender, non distended, no abdominal masses. Liver and spleen not palpable.  No hernias appreciated.  Stool sample for occult testing is not indicated.   GU: No CVA tenderness.  No bladder fullness or masses.  Atrophic external genitalia, normal pubic hair distribution, .  Normal urethral meatus, no lesions, no prolapse, no discharge.   No urethral masses and/or tenderness. No bladder fullness, tenderness or masses. Atrophic vaginal mucosa, areas of bleeding when examined, poor estrogen effect, no discharge, no lesions, good pelvic support, Grade II cystocele and Grade II rectocele noted.  Cervix and uterus are surgically absent.    No adnexal/parametria masses or tenderness noted.  Anus and perineum are without rashes or lesions.    Skin: No rashes, bruises or suspicious lesions. Lymph: No cervical or inguinal adenopathy. Neurologic: Grossly intact, no focal deficits, moving all 4 extremities. Psychiatric: Normal mood and affect.  Laboratory Data: Lab Results  Component Value Date   WBC 9.7 09/27/2014   HGB 14.4 09/27/2014   HCT 43.3 09/27/2014   MCV 87.1 09/27/2014   PLT 317 09/27/2014    Lab Results  Component Value Date   CREATININE 1.13* 09/27/2014    Urinalysis Results for orders placed or performed in visit on 08/24/15  Microscopic Examination  Result Value Ref Range   WBC, UA 6-10 (A) 0 -  5 /hpf   RBC, UA None seen 0 -  2 /hpf   Epithelial Cells (non renal) 0-10 0 - 10 /hpf  Renal Epithel, UA 0-10 (A) None seen /hpf   Bacteria, UA None seen None seen/Few  Urinalysis, Complete  Result Value Ref Range   Specific Gravity, UA 1.015 1.005 - 1.030   pH, UA 7.0 5.0 - 7.5   Color, UA Yellow Yellow   Appearance Ur Clear Clear   Leukocytes, UA 2+ (A) Negative   Protein, UA Negative Negative/Trace   Glucose, UA Negative Negative   Ketones, UA Negative Negative   RBC, UA Trace (A) Negative   Bilirubin, UA Negative Negative   Urobilinogen, Ur 0.2 0.2 - 1.0  mg/dL   Nitrite, UA Negative Negative   Microscopic Examination See below:    Assessment & Plan:    1. Recurrent UTI:   Patient has recurrent positive urine cultures for Beta hemolytic Streptococcus, group B treated with the appropriate antibiotics, but there is no resolution of symptoms.  I am suspicious that this may be a skin contaminate.  She does have a history of kidney stones and has not had a dedicated study in over three years.  We will obtain a CT renal stone study.  - Urinalysis, Complete   2. Microscopic hematuria:    She has had three incidences of microscopic hematuria.  Patient cannot undergo a CT Urogram due to her severe allergy to contrast.  She has had retrogrades in the past without incidence.  We will start her work up with a CT renal stone study.     3. Atrophic vaginitis:   Patient was given a sample of vaginal estrogen cream and instructed to apply 0.5mg  (pea-sized amount)  just inside the vaginal introitus with a finger-tip every night for two weeks and then Monday, Wednesday and Friday nights.  I explained to the patient that vaginally administered estrogen, which causes only a slight increase in the blood estrogen levels, have fewer contraindications and adverse systemic effects that oral HT.   Return for CT scan report.  Zara Council, [facility] [address] [place], [place] [address] [phone]

## 2015-08-26 ENCOUNTER — Encounter: Payer: Self-pay | Admitting: Obstetrics and Gynecology

## 2015-08-26 ENCOUNTER — Ambulatory Visit (INDEPENDENT_AMBULATORY_CARE_PROVIDER_SITE_OTHER): Payer: PPO | Admitting: Obstetrics and Gynecology

## 2015-08-26 ENCOUNTER — Ambulatory Visit: Payer: PPO | Admitting: Obstetrics and Gynecology

## 2015-08-26 ENCOUNTER — Telehealth: Payer: Self-pay

## 2015-08-26 VITALS — BP 133/82 | HR 81 | Ht 67.0 in | Wt 203.1 lb

## 2015-08-26 DIAGNOSIS — N8111 Cystocele, midline: Secondary | ICD-10-CM | POA: Insufficient documentation

## 2015-08-26 DIAGNOSIS — IMO0002 Reserved for concepts with insufficient information to code with codable children: Secondary | ICD-10-CM

## 2015-08-26 DIAGNOSIS — N816 Rectocele: Secondary | ICD-10-CM | POA: Diagnosis not present

## 2015-08-26 DIAGNOSIS — N8110 Cystocele, unspecified: Secondary | ICD-10-CM

## 2015-08-26 DIAGNOSIS — N941 Unspecified dyspareunia: Secondary | ICD-10-CM

## 2015-08-26 DIAGNOSIS — Z87898 Personal history of other specified conditions: Secondary | ICD-10-CM | POA: Insufficient documentation

## 2015-08-26 DIAGNOSIS — N390 Urinary tract infection, site not specified: Secondary | ICD-10-CM

## 2015-08-26 DIAGNOSIS — I10 Essential (primary) hypertension: Secondary | ICD-10-CM | POA: Insufficient documentation

## 2015-08-26 DIAGNOSIS — F319 Bipolar disorder, unspecified: Secondary | ICD-10-CM | POA: Insufficient documentation

## 2015-08-26 DIAGNOSIS — G609 Hereditary and idiopathic neuropathy, unspecified: Secondary | ICD-10-CM | POA: Insufficient documentation

## 2015-08-26 DIAGNOSIS — E876 Hypokalemia: Secondary | ICD-10-CM | POA: Insufficient documentation

## 2015-08-26 DIAGNOSIS — M25559 Pain in unspecified hip: Secondary | ICD-10-CM | POA: Insufficient documentation

## 2015-08-26 DIAGNOSIS — N952 Postmenopausal atrophic vaginitis: Secondary | ICD-10-CM | POA: Diagnosis not present

## 2015-08-26 NOTE — Progress Notes (Signed)
GYN ENCOUNTER NOTE  Subjective:       Erin Campos is a 59 y.o. [id number] female is here for gynecologic evaluation of the following issues:  1.Referral from urology for evaluation of pelvic organ prolapse (cystocele, rectocele).  The patient is a 59 year old married white female, para 2002, status post TAH/BSO for uterine fibroids and polycystic ovaries in 2004, status post bladder TACK" at the time of hysterectomy, presents in referral from urology for evaluation of cystocele and rectocele.  GYN history: Menarche age 50. Status post TAH/BSO 2004 for uterine fibroids and polycystic ovaries. ERT use for less than 1 year, subsequently discontinued. Currently having occasional vasomotor symptoms. Evaluation by urology did demonstrate vaginal atrophy.  Past OB history: Para 2002. SVD x2 Largest baby 9 lbs. 11 oz.  GU history: Frequency 3-4 times per day. Nocturia times 1 at night. No SUI symptoms. No chronic urgency symptoms. Patient does note some incomplete bladder emptying.  GI history: Bowel frequency every other day. Patient does use Colace stool softeners. Patient does splint for bowel movements.   Gynecologic History No LMP recorded. Patient has had a hysterectomy. Contraception: Status post TAH/BSO  Obstetric History OB History  Gravida Para Term Preterm AB SAB TAB Ectopic Multiple Living  2 2 2       2     # Outcome Date GA Lbr Len/2nd Weight Sex Delivery Anes PTL Lv  2 Term 1983   9 lb 4.8 oz (4.218 kg) F Vag-Spont   Y  1 Term 1981   9 lb 1.8 oz (4.132 kg) M VBAC   Y      Past Medical History  Diagnosis Date  . Kidney stone   . SVT (supraventricular tachycardia) ([place])   . Anxiety   . Arrhythmia   . Arthritis   . Facial basal cell cancer   . Spleen anomaly     compressed  . Controlled diabetes mellitus ([place])   . Heart attack ([place])   . High cholesterol   . HTN (hypertension)   . Sleep apnea     CPAP  . Recurrent UTI     Past Surgical History   Procedure Laterality Date  . Abdominal hysterectomy    . Heart ablation    . Kidney stone surgery      stents  . Cosmetic surgery  2014    Lip  . Basal cell carcinoma excision      face  . Bladder tack  2004    Current Outpatient Prescriptions on File Prior to Visit  Medication Sig Dispense Refill  . amoxicillin-clavulanate (AUGMENTIN) 875-125 MG tablet Take by mouth.    Marland Kitchen aspirin 81 MG chewable tablet Chew by mouth.    Marland Kitchen atenolol (TENORMIN) 25 MG tablet Take 25 mg by mouth daily.    . carisoprodol (SOMA) 350 MG tablet Take by mouth.    . fenofibrate (TRICOR) 145 MG tablet Take 145 mg by mouth daily.  4  . glucose blood test strip     . KLOR-CON M20 20 MEQ tablet Take 20 mEq by mouth daily.  3  . losartan (COZAAR) 50 MG tablet Take 50 mg by mouth daily.  5  . Magnesium 500 MG CAPS Take by mouth.    . niacin (NIASPAN) 500 MG CR tablet Take 500 mg by mouth at bedtime.  4  . nortriptyline (PAMELOR) 75 MG capsule Take 150 mg by mouth every evening.    Marland Kitchen oxyCODONE (OXY IR/ROXICODONE) 5 MG immediate release tablet TAKE 1  TABLET BY ORAL ROUTE EVERY 6 HOURS AS NEEDED (DNF 07/22/15)  0  . RELPAX 20 MG tablet TAKE 1 TAB BY MOUTH ONCE AS NEEDED FOR MIGRAINE.IF HEADACHE RETURNS MAY TAKE 2ND DOSE AFTER 2 HOURS  0  . temazepam (RESTORIL) 15 MG capsule Take 15 mg by mouth at bedtime as needed. for sleep  2  . triamterene-hydrochlorothiazide (MAXZIDE-25) 37.5-25 MG per tablet Take 1 tablet by mouth every morning.  3   No current facility-administered medications on file prior to visit.    Allergies  Allergen Reactions  . Demerol [Meperidine]   . Flu Virus Vaccine Other (See Comments)    Seizure, Drawing up of hands/feet  . Ivp Dye [Iodinated Diagnostic Agents]   . Nsaids   . Sulfa Antibiotics   . Ketorolac Tromethamine Palpitations    palpitations    Social History   Social History  . Marital Status: Married    Spouse Name: N/A  . Number of Children: N/A  . Years of Education:  N/A   Occupational History  . Not on file.   Social History Main Topics  . Smoking status: Former Research scientist (life sciences)  . Smokeless tobacco: Not on file     Comment: quit 20 years  . Alcohol Use: No  . Drug Use: No  . Sexual Activity: No   Other Topics Concern  . Not on file   Social History Narrative    Family History  Problem Relation Age of Onset  . Kidney Stones Father   . Hematuria Father   . Heart failure Mother     Father  . Melanoma Mother   . Diabetes Mother   . Diabetes Sister   . Breast cancer Maternal Aunt   . Colon cancer Maternal Aunt   . Diabetes Maternal Grandmother   . Ovarian cancer Neg Hx     The following portions of the patient's history were reviewed and updated as appropriate: allergies, current medications, past family history, past medical history, past social history, past surgical history and problem list.  Review of Systems Review of Systems - General ROS: negative for - chills, fatigue, fever, malaise or night sweats.  POSITIVE-hot flashes Hematological and Lymphatic ROS: negative for - bleeding problems or swollen lymph nodes Gastrointestinal ROS: negative for - abdominal pain, blood in stools, change in bowel habits and nausea/vomiting.  POSITIVE-constipation with splinting Musculoskeletal ROS: negative for - joint pain, muscle pain or muscular weakness Genito-Urinary ROS: negative for - change in menstrual cycle, dysmenorrhea, , dysuria, genital discharge, genital ulcers, hematuria, incontinence, irregular/heavy menses, nocturia or pelvic pain.  POSITIVE-dyspareunia  Objective:   BP 133/82 mmHg  Pulse 81  Ht 5\' 7"  (1.702 m)  Wt 203 lb 1.6 oz (92.126 kg)  BMI 31.80 kg/m2 CONSTITUTIONAL: Well-developed, well-nourished female in no acute distress.  HENT:  Normocephalic, atraumatic.  NECK: Normal range of motion, supple, no masses.  Normal thyroid.  SKIN: Skin is warm and dry. No rash noted. Not diaphoretic. No erythema. No pallor. [place]: Alert  and oriented to person, place, and time. PSYCHIATRIC: Normal mood and affect. Normal behavior. Normal judgment and thought content. CARDIOVASCULAR:Not Examined RESPIRATORY: Not Examined BREASTS: Not Examined ABDOMEN: Soft, non distended; Non tender.  No Organomegaly. PELVIC:  External Genitalia: Normal; Multiple hemangiomas on labia majora bilaterally  BUS: Atrophic  Vagina: Atrophy present, moderate; first degree cystocele; moderate rectocele  Cervix: Surgically absent  Uterus: Surgically absent  Adnexa: Normal  RV: Normal Sphincter tone; moderate rectocele with abundant firm stool in the rectal  vault  Bladder: Nontender MUSCULOSKELETAL: Normal range of motion. No tenderness.  No cyanosis, clubbing, or edema.     Assessment:   1.  Vaginal atrophy, moderate. 2.  Cystocele, first-degree, asymptomatic. 3.  Rectocele, moderate, symptomatic with chronic constipation. 4.  Dyspareunia   Plan:   1.  Colace 100-200 mg twice a day. 2.  Increase water intake. 3.  Fiber additives such as Metamucil or FiberCon daily 4.  Vaginal estrogen cream, Premarin 1 g intravaginally nightly 30 days, then twice a week. 5.  Return in 2 months for follow-up  Brayton Mars, MD   Note: This dictation was prepared with Dragon dictation along with smaller phrase technology. Any transcriptional errors that result from this process are unintentional.

## 2015-08-26 NOTE — Telephone Encounter (Signed)
Pt called stating when she went to apply estrace cream she noted "about 3 dozen dark blue and black spots" on her labia. Pt stated she has not been sexually active recently but when she was husband noted only a couple of spots. Pt voiced concern of melanoma due to mother and grandmother passing of melanoma. Pt will RTC today to see Ria Comment at 3:15.

## 2015-08-26 NOTE — Patient Instructions (Signed)
1.  Colace 100-200 mg twice a day. 2.  Increase water intake. 3.  Fiber additives such as Metamucil or FiberCon daily 4.  Vaginal estrogen cream, Premarin 1 g intravaginally nightly 30 days, then twice a week. 5.  Return in 2 months for follow-up

## 2015-08-30 ENCOUNTER — Telehealth: Payer: Self-pay | Admitting: Urology

## 2015-08-30 DIAGNOSIS — N3289 Other specified disorders of bladder: Secondary | ICD-10-CM

## 2015-08-30 NOTE — Telephone Encounter (Signed)
Her UA was fine.  Burning and dryness are usually the symptoms of vaginal atrophy.  She needs to continue the vaginal cream.  She should be scheduled for a CT Renal stone study.

## 2015-08-30 NOTE — Telephone Encounter (Signed)
Pt called wanting results of her urine.  States that she is still having symptoms of a UTI, burning, dryness, etc.  Finished antibiotic Sunday one week ago.  Please call with results and to discuss symptoms [phone].

## 2015-08-31 ENCOUNTER — Telehealth: Payer: Self-pay | Admitting: Urology

## 2015-08-31 MED ORDER — PHENAZOPYRIDINE HCL 200 MG PO TABS: 200.0000 mg | ORAL_TABLET | Freq: Three times a day (TID) | ORAL | Status: DC | PRN

## 2015-08-31 NOTE — Telephone Encounter (Signed)
LM with female for patient to call office back.

## 2015-08-31 NOTE — Telephone Encounter (Signed)
Spoke w/ pt. and relayed lab results and Shannon's comments/instructions.  Mrs Leaver is aware of her scheduled CT and her appointment with Larene Beach on 09/07/15.  A Rx for Pyridium was approved by Larene Beach and sent to the pt's pharmacy; Mrs Fennema is aware of this Rx as well and had no further questions.

## 2015-08-31 NOTE — Telephone Encounter (Signed)
LM with female for patient to call back 

## 2015-08-31 NOTE — Telephone Encounter (Signed)
I assume the progesterone cream is applied topical, like on the arm?

## 2015-08-31 NOTE — Telephone Encounter (Signed)
Pt wants to know if it was safe to use Estrace internally and Progesterone Cream at the same time.  Please advise.

## 2015-09-02 ENCOUNTER — Encounter: Payer: Self-pay | Admitting: *Deleted

## 2015-09-03 ENCOUNTER — Ambulatory Visit
Admission: RE | Admit: 2015-09-03 | Discharge: 2015-09-03 | Disposition: A | Discharge status: Home / Self Care | Payer: PPO | Source: Ambulatory Visit | Attending: Urology | Admitting: Urology

## 2015-09-03 ENCOUNTER — Ambulatory Visit: Admission: RE | Admit: 2015-09-03 | Payer: PPO | Source: Ambulatory Visit

## 2015-09-03 DIAGNOSIS — R3129 Other microscopic hematuria: Secondary | ICD-10-CM | POA: Diagnosis not present

## 2015-09-03 DIAGNOSIS — E279 Disorder of adrenal gland, unspecified: Secondary | ICD-10-CM | POA: Insufficient documentation

## 2015-09-03 DIAGNOSIS — K8020 Calculus of gallbladder without cholecystitis without obstruction: Secondary | ICD-10-CM | POA: Diagnosis not present

## 2015-09-07 ENCOUNTER — Ambulatory Visit (INDEPENDENT_AMBULATORY_CARE_PROVIDER_SITE_OTHER): Payer: PPO | Admitting: Urology

## 2015-09-07 ENCOUNTER — Telehealth: Payer: Self-pay | Admitting: Obstetrics and Gynecology

## 2015-09-07 ENCOUNTER — Encounter: Payer: Self-pay | Admitting: Urology

## 2015-09-07 VITALS — BP 147/85 | HR 83 | Ht 67.0 in | Wt 202.6 lb

## 2015-09-07 DIAGNOSIS — R3129 Other microscopic hematuria: Secondary | ICD-10-CM

## 2015-09-07 DIAGNOSIS — N952 Postmenopausal atrophic vaginitis: Secondary | ICD-10-CM | POA: Diagnosis not present

## 2015-09-07 DIAGNOSIS — E278 Other specified disorders of adrenal gland: Secondary | ICD-10-CM

## 2015-09-07 DIAGNOSIS — N390 Urinary tract infection, site not specified: Secondary | ICD-10-CM | POA: Diagnosis not present

## 2015-09-07 DIAGNOSIS — E279 Disorder of adrenal gland, unspecified: Secondary | ICD-10-CM | POA: Diagnosis not present

## 2015-09-07 LAB — URINALYSIS, COMPLETE
BILIRUBIN UA: NEGATIVE
Glucose, UA: NEGATIVE
KETONES UA: NEGATIVE
Leukocytes, UA: NEGATIVE
NITRITE UA: NEGATIVE
Protein, UA: NEGATIVE
SPEC GRAV UA: 1.015 (ref 1.005–1.030)
UUROB: 0.2 mg/dL (ref 0.2–1.0)
pH, UA: 8.5 — ABNORMAL HIGH (ref 5.0–7.5)

## 2015-09-07 LAB — MICROSCOPIC EXAMINATION

## 2015-09-07 MED ORDER — ESTRADIOL POWD: 0.5000 g | Status: DC

## 2015-09-07 MED ORDER — ESTRADIOL 0.1 MG/GM VA CREA: 1.0000 | TOPICAL_CREAM | Freq: Every day | VAGINAL | Status: DC

## 2015-09-07 NOTE — Telephone Encounter (Signed)
Pt given discount card info- rx [facility] in [place]. Pt will activate card and give me a call back if she is unable to use. I can send in compound if needed.

## 2015-09-07 NOTE — Telephone Encounter (Signed)
Pt said that vag cream was $600.00. She wants the compound like ya'll talked about. [facility] [place]. Call her if problem

## 2015-09-07 NOTE — Progress Notes (Signed)
09/07/2015 9:46 AM   Erin Campos 05-31-1956 [id number]  Referring provider: No referring provider defined for this encounter.  Chief Complaint  Patient presents with  . Results    CT    HPI: Patient is a 59 year old white female who presents today for her CT results.  Patient underwent CT scan for microscopic hematuria.  She did not receive the contrast due to her severe allergies to the contrast materials.  She has had retrogrades in the past without difficulty.    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   She has not had improvement with antibiotics of her symptoms.  She has not had episodes of gross hematuria, but she had 0-3 RBC's/hpf on three occassions. She is a former smoker. She does have a history of kidney stones. She has had ureteroscopy for one of these stones. She has a remote history of recurrent UTI's and has been evaluated by Dr. Marden Noble and Dr. Ria Comment at [facility]. She does not recall an etiology for her recurrent UTI's from those work ups.    Today, she is experiencing bladder spasms and pressure, pain in her urethra and in her inguinal area bilaterally.  She states this has been occuring for the last several days.  Her UA today is negative.  She is not having fevers, chills, nausea or vomiting.    CT scan noted cholelithiasis and a 2 cm left adrenal nodule which radiology recommended a repeat non contrast CT of the abdomen in one year.  There were no findings to explain the patient's symptoms.  I have reviewed the films with the patient.    PMH: Past Medical History  Diagnosis Date  . Kidney stone   . SVT (supraventricular tachycardia) ([place])   . Anxiety   . Arrhythmia   . Arthritis   . Facial basal cell cancer   . Spleen anomaly     compressed  . Controlled diabetes mellitus ([place])   . Heart attack ([place])   . High  cholesterol   . HTN (hypertension)   . Sleep apnea     CPAP  . Recurrent UTI     Surgical History: Past Surgical History  Procedure Laterality Date  . Heart ablation    . Kidney stone surgery      stents  . Cosmetic surgery  2014    Lip  . Basal cell carcinoma excision      face  . Bladder tack  2004  . Vaginal hysterectomy      Home Medications:    Medication List       This list is accurate as of: 09/07/15  9:46 AM.  Always use your most recent med list.               aspirin 81 MG chewable tablet  Chew by mouth.     atenolol 25 MG tablet  Commonly known as:  TENORMIN  Take 25 mg by mouth daily.     carisoprodol 350 MG tablet  Commonly known as:  SOMA  Take by mouth.     estradiol 0.1 MG/GM vaginal cream  Commonly known as:  ESTRACE  Place 1 Applicatorful vaginally at bedtime.     fenofibrate 145 MG tablet  Commonly known as:  TRICOR  Take 145 mg by mouth daily.     glucose blood test strip     KLOR-CON M20 20  MEQ tablet  Generic drug:  potassium chloride SA  Take 20 mEq by mouth daily.     losartan 50 MG tablet  Commonly known as:  COZAAR  Take 50 mg by mouth daily.     Magnesium 500 MG Caps  Take by mouth.     nortriptyline 75 MG capsule  Commonly known as:  PAMELOR  Take 150 mg by mouth every evening.     oxyCODONE-acetaminophen 5-325 MG tablet  Commonly known as:  PERCOCET/ROXICET  Take 1 tablet by mouth every 6 (six) hours as needed for severe pain.     temazepam 15 MG capsule  Commonly known as:  RESTORIL  Take 15 mg by mouth at bedtime as needed. for sleep     triamterene-hydrochlorothiazide 37.5-25 MG tablet  Commonly known as:  MAXZIDE-25  Take 1 tablet by mouth every morning.        Allergies:  Allergies  Allergen Reactions  . Demerol [Meperidine]   . Flu Virus Vaccine Other (See Comments)    Seizure, Drawing up of hands/feet  . Ivp Dye [Iodinated Diagnostic Agents]   . Nsaids   . Sulfa Antibiotics   . Ketorolac  Tromethamine Palpitations    palpitations    Family History: Family History  Problem Relation Age of Onset  . Kidney Stones Father   . Hematuria Father   . Heart failure Mother     Father  . Melanoma Mother   . Diabetes Mother   . Diabetes Sister   . Breast cancer Maternal Aunt   . Colon cancer Maternal Aunt   . Diabetes Maternal Grandmother   . Ovarian cancer Neg Hx     Social History:  reports that she has quit smoking. She does not have any smokeless tobacco history on file. She reports that she does not drink alcohol or use illicit drugs.  ROS: UROLOGY Frequent Urination?: No Hard to postpone urination?: No Burning/pain with urination?: No Get up at night to urinate?: No Leakage of urine?: No Urine stream starts and stops?: No Trouble starting stream?: No Do you have to strain to urinate?: No Blood in urine?: No Urinary tract infection?: No Sexually transmitted disease?: No Injury to kidneys or bladder?: No Painful intercourse?: No Weak stream?: No Currently pregnant?: No Vaginal bleeding?: No Last menstrual period?: n  Gastrointestinal Nausea?: No Vomiting?: No Indigestion/heartburn?: No Diarrhea?: No Constipation?: No  Constitutional Fever: No Night sweats?: No Weight loss?: No Fatigue?: Yes  Skin Skin rash/lesions?: No Itching?: No  Eyes Blurred vision?: No Double vision?: No  Ears/Nose/Throat Sore throat?: No Sinus problems?: No  Hematologic/Lymphatic Swollen glands?: No Easy bruising?: No  Cardiovascular Leg swelling?: No Chest pain?: No  Respiratory Cough?: No Shortness of breath?: No  Endocrine Excessive thirst?: No  Musculoskeletal Back pain?: Yes Joint pain?: No  Neurological Headaches?: No Dizziness?: No  Psychologic Depression?: No Anxiety?: No  Physical Exam: BP 147/85 mmHg  Pulse 83  Ht 5\' 7"  (1.702 m)  Wt 202 lb 9.6 oz (91.899 kg)  BMI 31.72 kg/m2  Constitutional: Well nourished. Alert and oriented,  No acute distress. HEENT: [place] AT, moist mucus membranes. Trachea midline, no masses. Cardiovascular: No clubbing, cyanosis, or edema. Respiratory: Normal respiratory effort, no increased work of breathing. GI: Abdomen is soft, non tender, non distended, no abdominal masses. Liver and spleen not palpable.  No hernias appreciated.  Stool sample for occult testing is not indicated.   GU: No CVA tenderness.  No bladder fullness or masses.   Skin:  No rashes, bruises or suspicious lesions. Lymph: No cervical or inguinal adenopathy. Neurologic: Grossly intact, no focal deficits, moving all 4 extremities. Psychiatric: Normal mood and affect.  Laboratory Data: Lab Results  Component Value Date   WBC 9.7 09/27/2014   HGB 14.4 09/27/2014   HCT 43.3 09/27/2014   MCV 87.1 09/27/2014   PLT 317 09/27/2014    Lab Results  Component Value Date   CREATININE 1.13* 09/27/2014     Urinalysis Results for orders placed or performed in visit on 09/07/15  Microscopic Examination  Result Value Ref Range   WBC, UA 0-5 0 -  5 /hpf   RBC, UA 0-2 0 -  2 /hpf   Epithelial Cells (non renal) 0-10 0 - 10 /hpf   Bacteria, UA Few (A) None seen/Few  Urinalysis, Complete  Result Value Ref Range   Specific Gravity, UA 1.015 1.005 - 1.030   pH, UA 8.5 (H) 5.0 - 7.5   Color, UA Yellow Yellow   Appearance Ur Cloudy (A) Clear   Leukocytes, UA Negative Negative   Protein, UA Negative Negative/Trace   Glucose, UA Negative Negative   Ketones, UA Negative Negative   RBC, UA Trace (A) Negative   Bilirubin, UA Negative Negative   Urobilinogen, Ur 0.2 0.2 - 1.0 mg/dL   Nitrite, UA Negative Negative   Microscopic Examination See below:     Pertinent Imaging: CLINICAL DATA: Initial encounter for left lower quadrant quadrant and left groin pain worsening intermittently over the last 2-3 months. Assisted with gross hematuria and dysuria with urinary frequency.  EXAM: CT ABDOMEN AND PELVIS WITHOUT  CONTRAST  TECHNIQUE: Multidetector CT imaging of the abdomen and pelvis was performed following the standard protocol without IV contrast.  COMPARISON: None.  FINDINGS: Lower chest: Unremarkable.  Hepatobiliary: No focal abnormality in the liver on this study without intravenous contrast. No evidence of hepatomegaly. Gallstones noted, measuring up to 19 mm diameter. No intrahepatic or extrahepatic biliary dilation.  Pancreas: No focal mass lesion. No dilatation of the main duct. No intraparenchymal cyst. No peripancreatic edema.  Spleen: No splenomegaly. No focal mass lesion.  Adrenals/Urinary Tract: Right adrenal gland is normal in appearance. Densely calcified 2 cm left adrenal nodule is identified. Although incompletely visualized previously, this nodule was apparent on coronal images from MRI of 06/01/2014. The lesion measures 1.8 cm in craniocaudal length on both studies.  No evidence for renal stones. No ureteral or bladder stones.  Stomach/Bowel: Stomach is nondistended. No gastric wall thickening. No evidence of outlet obstruction. Small duodenum diverticulum noted. No small bowel wall thickening. No small bowel dilatation. The terminal ileum is normal. The appendix is not visualized, but there is no edema or inflammation in the region of the cecum. No gross colonic mass. No colonic wall thickening. No substantial diverticular change.  Vascular/Lymphatic: No abdominal aortic aneurysm. No abdominal atherosclerotic calcification. There is no gastrohepatic or hepatoduodenal ligament lymphadenopathy. No intraperitoneal or retroperitoneal lymphadenopy. No pelvic sidewall lymphadenopathy.  Reproductive: Uterus surgically absent. There is no adnexal mass.  Other: No intraperitoneal free fluid.  Musculoskeletal: Mild degenerative changes noted left hip. Bone windows reveal no worrisome lytic or sclerotic osseous lesions.  IMPRESSION: 1. No acute  findings in the abdomen or pelvis. Specifically, no evidence to explain the history of left lower quadrant and groin pain with hematuria. 2. Cholelithiasis. 3. Densely calcified 2 cm left adrenal nodule, not substantially changed since 06/01/2014. This is probably benign and may be secondary to previous inflammation or hemorrhage. Given the  1 year of interval imaging stability, consider 1 additional followup CT scan of the abdomen without contrast in 12 months to confirm 2 years of stability.   Electronically Signed  By: Misty Stanley M.D.  On: 09/03/2015 11:02  Assessment & Plan:    1. Microscopic hematuria:   CT w/o did not demonstrate any renal masses, hydronephrosis or hydronephrosis.  Because of her severe allergies to contrast a CT Urogram could not be preformed, she will need to undergo cystoscopy with bilateral retrogrades in the OR.  I described to the patient how the procedure is performed and the risk associated with the procedure, such as: Infection, bleeding, uncomfortableness for the first few days after the procedure, the possibility of a biopsy of an area of concern in the ureters or bladder and the possibility of a stent placement.  I also explained the risks of general anesthesia, such as: MI, CVA, paralysis, coma and/or death.  She has a history of heart ablations, so she will need cardiac clearance prior to proceeding.  - Urinalysis, Complete - CULTURE, URINE COMPREHENSIVE - BUN + creatinine - CBC with diff/platelet  2. Left adrenal nodule:   Incidental finding on CT scan.  Radiologist recommends a repeat non contrast CT of the abdomen in one year.    3. Recurrent UTI's:   Patient has had three incidences of UTI's with Beta hemolytic Streptococcus, group B.  Her symptoms did not improve with the use of antibiotics and her urine culture with [place] was negative.  I am suspicious that organism is a skin contaminate and not an UTI.  She will be undergoing cystoscopy with  retrogrades in the future.  4. Atrophic vaginitis:   Patient is applying the vaginal estrogen cream.    Return for cystoscopy with bilateral retrogrades.  Zara Council, [facility] [address] [place], [place] [address] [phone]

## 2015-09-07 NOTE — Telephone Encounter (Signed)
Sent in both compound and estrace- to [facility]. Pt will be a self paying pt. She will get whatever is cheaper. Call me back if any issues.

## 2015-09-08 LAB — CBC WITH DIFFERENTIAL/PLATELET
BASOS ABS: 0 10*3/uL (ref 0.0–0.2)
Basos: 0 %
EOS (ABSOLUTE): 0.4 10*3/uL (ref 0.0–0.4)
Eos: 5 %
Hematocrit: 38 % (ref 34.0–46.6)
Hemoglobin: 12.8 g/dL (ref 11.1–15.9)
Immature Grans (Abs): 0 10*3/uL (ref 0.0–0.1)
Immature Granulocytes: 0 %
LYMPHS ABS: 2.3 10*3/uL (ref 0.7–3.1)
LYMPHS: 32 %
MCH: 29.8 pg (ref 26.6–33.0)
MCHC: 33.7 g/dL (ref 31.5–35.7)
MCV: 89 fL (ref 79–97)
MONOCYTES: 7 %
Monocytes Absolute: 0.5 10*3/uL (ref 0.1–0.9)
NEUTROS ABS: 4 10*3/uL (ref 1.4–7.0)
NEUTROS PCT: 56 %
PLATELETS: 324 10*3/uL (ref 150–379)
RBC: 4.29 x10E6/uL (ref 3.77–5.28)
RDW: 13.8 % (ref 12.3–15.4)
WBC: 7.3 10*3/uL (ref 3.4–10.8)

## 2015-09-08 LAB — BUN+CREAT
BUN / CREAT RATIO: 15 (ref 9–23)
BUN: 15 mg/dL (ref 6–24)
Creatinine, Ser: 0.97 mg/dL (ref 0.57–1.00)
GFR calc non Af Amer: 65 mL/min/{1.73_m2} (ref >59)
GFR, EST AFRICAN AMERICAN: 74 mL/min/{1.73_m2} (ref >59)

## 2015-09-09 ENCOUNTER — Telehealth: Payer: Self-pay | Admitting: Radiology

## 2015-09-09 LAB — CULTURE, URINE COMPREHENSIVE

## 2015-09-09 NOTE — Telephone Encounter (Signed)
Notified pt of surgery scheduled 10/04/15, pre-admit testing appt on 09/23/15 @8 :30 and to call Friday prior to surgery for arrival time to [facility]. Advised pt to be npo after mn day of surgery. Pt voices understanding. Mailed Surgery and Pre-Admission Testing Information Sheet to pt.

## 2015-09-12 DIAGNOSIS — E278 Other specified disorders of adrenal gland: Secondary | ICD-10-CM | POA: Insufficient documentation

## 2015-09-12 DIAGNOSIS — E279 Disorder of adrenal gland, unspecified: Secondary | ICD-10-CM

## 2015-09-15 ENCOUNTER — Other Ambulatory Visit: Payer: PPO

## 2015-09-23 ENCOUNTER — Inpatient Hospital Stay: Admission: RE | Admit: 2015-09-23 | Payer: PPO | Source: Ambulatory Visit

## 2015-09-24 ENCOUNTER — Encounter
Admission: RE | Admit: 2015-09-24 | Discharge: 2015-09-24 | Disposition: A | Discharge status: Home / Self Care | Payer: PPO | Source: Ambulatory Visit | Attending: Urology | Admitting: Urology

## 2015-09-24 DIAGNOSIS — Z01812 Encounter for preprocedural laboratory examination: Secondary | ICD-10-CM | POA: Diagnosis present

## 2015-09-24 HISTORY — DX: Other intervertebral disc degeneration, lumbar region without mention of lumbar back pain or lower extremity pain: M51.369

## 2015-09-24 HISTORY — DX: Other intervertebral disc displacement, lumbar region: M51.26

## 2015-09-24 HISTORY — DX: Other intervertebral disc degeneration, lumbar region: M51.36

## 2015-09-24 LAB — BASIC METABOLIC PANEL
ANION GAP: 8 (ref 5–15)
BUN: 18 mg/dL (ref 6–20)
CALCIUM: 9.8 mg/dL (ref 8.9–10.3)
CHLORIDE: 102 mmol/L (ref 101–111)
CO2: 29 mmol/L (ref 22–32)
CREATININE: 0.89 mg/dL (ref 0.44–1.00)
GFR calc non Af Amer: >60 mL/min (ref >60)
Glucose, Bld: 85 mg/dL (ref 65–99)
Potassium: 3.7 mmol/L (ref 3.5–5.1)
SODIUM: 139 mmol/L (ref 135–145)

## 2015-09-24 NOTE — Patient Instructions (Signed)
  Your procedure is scheduled on: October 04, 2015 (Monday) Report to Day Surgery.[facility]) Second Floor To find out your arrival time please call [phone] between 1PM - 3PM on October 01, 2015 (Friday).  Remember: Instructions that are not followed completely may result in serious medical risk, up to and including death, or upon the discretion of your surgeon and anesthesiologist your surgery may need to be rescheduled.    _x___ 1. Do not eat food or drink liquids after midnight. No gum chewing or hard candies.     ____ 2. No Alcohol for 24 hours before or after surgery.   ____ 3. Bring all medications with you on the day of surgery if instructed.    _x___ 4. Notify your doctor if there is any change in your medical condition     (cold, fever, infections).     Do not wear jewelry, make-up, hairpins, clips or nail polish.  Do not wear lotions, powders, or perfumes. You may wear deodorant.  Do not shave 48 hours prior to surgery. Men may shave face and neck.  Do not bring valuables to the hospital.    [facility] is not responsible for any belongings or valuables.               Contacts, dentures or bridgework may not be worn into surgery.  Leave your suitcase in the car. After surgery it may be brought to your room.  For patients admitted to the hospital, discharge time is determined by your                treatment team.   Patients discharged the day of surgery will not be allowed to drive home.   Please read over the following fact sheets that you were given:   Surgical Site Infection Prevention   ____ Take these medicines the morning of surgery with A SIP OF WATER:    1. Atenolol  2. Losartan  3. Magnesium  4.  5.  6.  ____ Fleet Enema (as directed)   ____ Use CHG Soap as directed  ____ Use inhalers on the day of surgery  ____ Stop metformin 2 days prior to surgery    ____ Take 1/2 of usual insulin dose the night before surgery and none on the morning of  surgery.   __x__ Stop Coumadin/Plavix/aspirin on (STOP ASPIRIN January 1 PER OFFICE)  ____ Stop Anti-inflammatories on    ____ Stop supplements until after surgery.    __x__ Bring C-Pap to the hospital.

## 2015-09-28 DIAGNOSIS — M1711 Unilateral primary osteoarthritis, right knee: Secondary | ICD-10-CM | POA: Diagnosis not present

## 2015-10-01 MED ORDER — HYDROMORPHONE HCL 1 MG/ML IJ SOLN
INTRAMUSCULAR | Status: AC
  Filled 2015-10-01: qty 1

## 2015-10-04 ENCOUNTER — Encounter
Admission: RE | Disposition: A | Discharge status: Home / Self Care | Payer: Self-pay | Source: Ambulatory Visit | Attending: Urology

## 2015-10-04 ENCOUNTER — Ambulatory Visit
Admission: RE | Admit: 2015-10-04 | Discharge: 2015-10-04 | Disposition: A | Discharge status: Home / Self Care | Payer: PPO | Source: Ambulatory Visit | Attending: Urology | Admitting: Urology

## 2015-10-04 ENCOUNTER — Ambulatory Visit: Payer: PPO | Admitting: Anesthesiology

## 2015-10-04 ENCOUNTER — Encounter: Payer: Self-pay | Admitting: *Deleted

## 2015-10-04 DIAGNOSIS — E119 Type 2 diabetes mellitus without complications: Secondary | ICD-10-CM | POA: Insufficient documentation

## 2015-10-04 DIAGNOSIS — Z800 Family history of malignant neoplasm of digestive organs: Secondary | ICD-10-CM | POA: Insufficient documentation

## 2015-10-04 DIAGNOSIS — Z87440 Personal history of urinary (tract) infections: Secondary | ICD-10-CM | POA: Diagnosis not present

## 2015-10-04 DIAGNOSIS — R310 Gross hematuria: Secondary | ICD-10-CM | POA: Diagnosis not present

## 2015-10-04 DIAGNOSIS — Z79899 Other long term (current) drug therapy: Secondary | ICD-10-CM | POA: Diagnosis not present

## 2015-10-04 DIAGNOSIS — F419 Anxiety disorder, unspecified: Secondary | ICD-10-CM | POA: Insufficient documentation

## 2015-10-04 DIAGNOSIS — Z8249 Family history of ischemic heart disease and other diseases of the circulatory system: Secondary | ICD-10-CM | POA: Insufficient documentation

## 2015-10-04 DIAGNOSIS — G4730 Sleep apnea, unspecified: Secondary | ICD-10-CM | POA: Diagnosis not present

## 2015-10-04 DIAGNOSIS — Z887 Allergy status to serum and vaccine status: Secondary | ICD-10-CM | POA: Insufficient documentation

## 2015-10-04 DIAGNOSIS — I252 Old myocardial infarction: Secondary | ICD-10-CM | POA: Insufficient documentation

## 2015-10-04 DIAGNOSIS — N3289 Other specified disorders of bladder: Secondary | ICD-10-CM | POA: Insufficient documentation

## 2015-10-04 DIAGNOSIS — Z87891 Personal history of nicotine dependence: Secondary | ICD-10-CM | POA: Insufficient documentation

## 2015-10-04 DIAGNOSIS — I471 Supraventricular tachycardia: Secondary | ICD-10-CM | POA: Diagnosis not present

## 2015-10-04 DIAGNOSIS — Z91041 Radiographic dye allergy status: Secondary | ICD-10-CM | POA: Diagnosis not present

## 2015-10-04 DIAGNOSIS — M1990 Unspecified osteoarthritis, unspecified site: Secondary | ICD-10-CM | POA: Diagnosis not present

## 2015-10-04 DIAGNOSIS — Z90710 Acquired absence of both cervix and uterus: Secondary | ICD-10-CM | POA: Insufficient documentation

## 2015-10-04 DIAGNOSIS — Z87442 Personal history of urinary calculi: Secondary | ICD-10-CM | POA: Insufficient documentation

## 2015-10-04 DIAGNOSIS — R300 Dysuria: Secondary | ICD-10-CM | POA: Diagnosis not present

## 2015-10-04 DIAGNOSIS — D7389 Other diseases of spleen: Secondary | ICD-10-CM | POA: Insufficient documentation

## 2015-10-04 DIAGNOSIS — N390 Urinary tract infection, site not specified: Secondary | ICD-10-CM | POA: Diagnosis not present

## 2015-10-04 DIAGNOSIS — Z808 Family history of malignant neoplasm of other organs or systems: Secondary | ICD-10-CM | POA: Insufficient documentation

## 2015-10-04 DIAGNOSIS — Z888 Allergy status to other drugs, medicaments and biological substances status: Secondary | ICD-10-CM | POA: Diagnosis not present

## 2015-10-04 DIAGNOSIS — Z803 Family history of malignant neoplasm of breast: Secondary | ICD-10-CM | POA: Diagnosis not present

## 2015-10-04 DIAGNOSIS — I10 Essential (primary) hypertension: Secondary | ICD-10-CM | POA: Insufficient documentation

## 2015-10-04 DIAGNOSIS — R3129 Other microscopic hematuria: Secondary | ICD-10-CM | POA: Diagnosis not present

## 2015-10-04 DIAGNOSIS — Z882 Allergy status to sulfonamides status: Secondary | ICD-10-CM | POA: Diagnosis not present

## 2015-10-04 DIAGNOSIS — R311 Benign essential microscopic hematuria: Secondary | ICD-10-CM | POA: Diagnosis not present

## 2015-10-04 DIAGNOSIS — N952 Postmenopausal atrophic vaginitis: Secondary | ICD-10-CM | POA: Diagnosis not present

## 2015-10-04 DIAGNOSIS — F319 Bipolar disorder, unspecified: Secondary | ICD-10-CM | POA: Insufficient documentation

## 2015-10-04 DIAGNOSIS — Z7982 Long term (current) use of aspirin: Secondary | ICD-10-CM | POA: Diagnosis not present

## 2015-10-04 DIAGNOSIS — G609 Hereditary and idiopathic neuropathy, unspecified: Secondary | ICD-10-CM | POA: Insufficient documentation

## 2015-10-04 DIAGNOSIS — K8020 Calculus of gallbladder without cholecystitis without obstruction: Secondary | ICD-10-CM | POA: Diagnosis not present

## 2015-10-04 DIAGNOSIS — Z85828 Personal history of other malignant neoplasm of skin: Secondary | ICD-10-CM | POA: Diagnosis not present

## 2015-10-04 DIAGNOSIS — Z885 Allergy status to narcotic agent status: Secondary | ICD-10-CM | POA: Insufficient documentation

## 2015-10-04 DIAGNOSIS — Z841 Family history of disorders of kidney and ureter: Secondary | ICD-10-CM | POA: Insufficient documentation

## 2015-10-04 DIAGNOSIS — E7800 Pure hypercholesterolemia, unspecified: Secondary | ICD-10-CM | POA: Insufficient documentation

## 2015-10-04 DIAGNOSIS — Z833 Family history of diabetes mellitus: Secondary | ICD-10-CM | POA: Diagnosis not present

## 2015-10-04 HISTORY — PX: CYSTOSCOPY W/ RETROGRADES: SHX1426

## 2015-10-04 LAB — GLUCOSE, CAPILLARY
GLUCOSE-CAPILLARY: 81 mg/dL (ref 65–99)
GLUCOSE-CAPILLARY: 99 mg/dL (ref 65–99)

## 2015-10-04 SURGERY — CYSTOSCOPY, WITH RETROGRADE PYELOGRAM
Anesthesia: General | Wound class: Clean Contaminated

## 2015-10-04 MED ORDER — FENTANYL CITRATE (PF) 100 MCG/2ML IJ SOLN
25.0000 ug | INTRAMUSCULAR | Status: DC | PRN
  Administered 2015-10-04 (×2): 25 ug via INTRAVENOUS

## 2015-10-04 MED ORDER — FAMOTIDINE 20 MG PO TABS
ORAL_TABLET | ORAL | Status: AC
  Filled 2015-10-04: qty 1

## 2015-10-04 MED ORDER — FAMOTIDINE 20 MG PO TABS
20.0000 mg | ORAL_TABLET | Freq: Once | ORAL | Status: AC
  Administered 2015-10-04: 20 mg via ORAL

## 2015-10-04 MED ORDER — PROPOFOL 10 MG/ML IV BOLUS
INTRAVENOUS | Status: DC | PRN
  Administered 2015-10-04: 60 mg via INTRAVENOUS
  Administered 2015-10-04: 200 mg via INTRAVENOUS

## 2015-10-04 MED ORDER — FENTANYL CITRATE (PF) 100 MCG/2ML IJ SOLN
INTRAMUSCULAR | Status: AC
  Administered 2015-10-04: 25 ug via INTRAVENOUS
  Filled 2015-10-04: qty 2

## 2015-10-04 MED ORDER — ONDANSETRON HCL 4 MG/2ML IJ SOLN
INTRAMUSCULAR | Status: DC | PRN
  Administered 2015-10-04: 4 mg via INTRAVENOUS

## 2015-10-04 MED ORDER — MIDAZOLAM HCL 2 MG/2ML IJ SOLN
INTRAMUSCULAR | Status: DC | PRN
  Administered 2015-10-04: 2 mg via INTRAVENOUS

## 2015-10-04 MED ORDER — FENTANYL CITRATE (PF) 100 MCG/2ML IJ SOLN
INTRAMUSCULAR | Status: DC | PRN
  Administered 2015-10-04: 50 ug via INTRAVENOUS

## 2015-10-04 MED ORDER — CEFAZOLIN SODIUM-DEXTROSE 2-3 GM-% IV SOLR: 2.0000 g | Freq: Once | INTRAVENOUS | Status: DC

## 2015-10-04 MED ORDER — SODIUM CHLORIDE 0.9 % IV SOLN
INTRAVENOUS | Status: DC
  Administered 2015-10-04: 09:00:00 via INTRAVENOUS

## 2015-10-04 MED ORDER — PROMETHAZINE HCL 25 MG/ML IJ SOLN: 6.2500 mg | INTRAMUSCULAR | Status: DC | PRN

## 2015-10-04 MED ORDER — IOTHALAMATE MEGLUMINE 43 % IV SOLN
INTRAVENOUS | Status: DC | PRN
  Administered 2015-10-04: 30 mL

## 2015-10-04 MED ORDER — LIDOCAINE HCL (CARDIAC) 20 MG/ML IV SOLN
INTRAVENOUS | Status: DC | PRN
  Administered 2015-10-04: 100 mg via INTRAVENOUS

## 2015-10-04 MED ORDER — CEFAZOLIN SODIUM-DEXTROSE 2-3 GM-% IV SOLR
INTRAVENOUS | Status: AC
  Administered 2015-10-04: 2 g via INTRAVENOUS
  Filled 2015-10-04: qty 50

## 2015-10-04 SURGICAL SUPPLY — 27 items
BAG DRAIN CYSTO-URO LG1000N (MISCELLANEOUS) ×4 IMPLANT
CATH URETL 5X70 OPEN END (CATHETERS) ×4 IMPLANT
CONRAY 43 FOR UROLOGY 50M (MISCELLANEOUS) ×4 IMPLANT
CORD URO TURP 10FT (MISCELLANEOUS) ×4 IMPLANT
GLOVE BIO SURGEON STRL SZ 6.5 (GLOVE) ×3 IMPLANT
GLOVE BIO SURGEON STRL SZ7 (GLOVE) ×8 IMPLANT
GLOVE BIO SURGEONS STRL SZ 6.5 (GLOVE) ×1
GOWN STRL REUS W/ TWL LRG LVL3 (GOWN DISPOSABLE) ×4 IMPLANT
GOWN STRL REUS W/ TWL LRG LVL4 (GOWN DISPOSABLE) ×4 IMPLANT
GOWN STRL REUS W/TWL LRG LVL3 (GOWN DISPOSABLE) ×4
GOWN STRL REUS W/TWL LRG LVL4 (GOWN DISPOSABLE) ×4
KIT RM TURNOVER CYSTO AR (KITS) ×4 IMPLANT
PACK CYSTO AR (MISCELLANEOUS) ×4 IMPLANT
PAD GROUND ADULT SPLIT (MISCELLANEOUS) ×4 IMPLANT
PREP PVP WINGED SPONGE (MISCELLANEOUS) ×4 IMPLANT
PUMP SINGLE ACTION SAP (PUMP) IMPLANT
SENSORWIRE 0.038 NOT ANGLED (WIRE) ×8
SET CYSTO W/LG BORE CLAMP LF (SET/KITS/TRAYS/PACK) ×4 IMPLANT
SOL .9 NS 3000ML IRR  AL (IV SOLUTION) ×2
SOL .9 NS 3000ML IRR UROMATIC (IV SOLUTION) ×2 IMPLANT
STENT URET 6FRX24 CONTOUR (STENTS) IMPLANT
STENT URET 6FRX26 CONTOUR (STENTS) IMPLANT
SURGILUBE 2OZ TUBE FLIPTOP (MISCELLANEOUS) ×4 IMPLANT
SYRINGE IRR TOOMEY STRL 70CC (SYRINGE) ×4 IMPLANT
WATER STERILE IRR 1000ML POUR (IV SOLUTION) ×4 IMPLANT
WATER STERILE IRR 3000ML UROMA (IV SOLUTION) ×4 IMPLANT
WIRE SENSOR 0.038 NOT ANGLED (WIRE) ×4 IMPLANT

## 2015-10-04 NOTE — H&P (View-Only) (Signed)
09/07/2015 9:46 AM   Erin Campos [birth year] [id number]  Referring provider: No referring provider defined for this encounter.  Chief Complaint  Patient presents with  . Results    CT    HPI: Patient is a 60 year old white female who presents today for her CT results.  Patient underwent CT scan for microscopic hematuria.  She did not receive the contrast due to her severe allergies to the contrast materials.  She has had retrogrades in the past without difficulty.    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   She has not had improvement with antibiotics of her symptoms.  She has not had episodes of gross hematuria, but she had 0-3 RBC's/hpf on three occassions. She is a former smoker. She does have a history of kidney stones. She has had ureteroscopy for one of these stones. She has a remote history of recurrent UTI's and has been evaluated by Dr. Marden Noble and Dr. Ria Comment at [facility]. She does not recall an etiology for her recurrent UTI's from those work ups.    Today, she is experiencing bladder spasms and pressure, pain in her urethra and in her inguinal area bilaterally.  She states this has been occuring for the last several days.  Her UA today is negative.  She is not having fevers, chills, nausea or vomiting.    CT scan noted cholelithiasis and a 2 cm left adrenal nodule which radiology recommended a repeat non contrast CT of the abdomen in one year.  There were no findings to explain the patient's symptoms.  I have reviewed the films with the patient.    PMH: Past Medical History  Diagnosis Date  . Kidney stone   . SVT (supraventricular tachycardia) ([place])   . Anxiety   . Arrhythmia   . Arthritis   . Facial basal cell cancer   . Spleen anomaly     compressed  . Controlled diabetes mellitus ([place])   . Heart attack ([place])   . High  cholesterol   . HTN (hypertension)   . Sleep apnea     CPAP  . Recurrent UTI     Surgical History: Past Surgical History  Procedure Laterality Date  . Heart ablation    . Kidney stone surgery      stents  . Cosmetic surgery  2014    Lip  . Basal cell carcinoma excision      face  . Bladder tack  2004  . Vaginal hysterectomy      Home Medications:    Medication List       This list is accurate as of: 09/07/15  9:46 AM.  Always use your most recent med list.               aspirin 81 MG chewable tablet  Chew by mouth.     atenolol 25 MG tablet  Commonly known as:  TENORMIN  Take 25 mg by mouth daily.     carisoprodol 350 MG tablet  Commonly known as:  SOMA  Take by mouth.     estradiol 0.1 MG/GM vaginal cream  Commonly known as:  ESTRACE  Place 1 Applicatorful vaginally at bedtime.     fenofibrate 145 MG tablet  Commonly known as:  TRICOR  Take 145 mg by mouth daily.     glucose blood test strip     KLOR-CON M20 20  MEQ tablet  Generic drug:  potassium chloride SA  Take 20 mEq by mouth daily.     losartan 50 MG tablet  Commonly known as:  COZAAR  Take 50 mg by mouth daily.     Magnesium 500 MG Caps  Take by mouth.     nortriptyline 75 MG capsule  Commonly known as:  PAMELOR  Take 150 mg by mouth every evening.     oxyCODONE-acetaminophen 5-325 MG tablet  Commonly known as:  PERCOCET/ROXICET  Take 1 tablet by mouth every 6 (six) hours as needed for severe pain.     temazepam 15 MG capsule  Commonly known as:  RESTORIL  Take 15 mg by mouth at bedtime as needed. for sleep     triamterene-hydrochlorothiazide 37.5-25 MG tablet  Commonly known as:  MAXZIDE-25  Take 1 tablet by mouth every morning.        Allergies:  Allergies  Allergen Reactions  . Demerol [Meperidine]   . Flu Virus Vaccine Other (See Comments)    Seizure, Drawing up of hands/feet  . Ivp Dye [Iodinated Diagnostic Agents]   . Nsaids   . Sulfa Antibiotics   . Ketorolac  Tromethamine Palpitations    palpitations    Family History: Family History  Problem Relation Age of Onset  . Kidney Stones Father   . Hematuria Father   . Heart failure Mother     Father  . Melanoma Mother   . Diabetes Mother   . Diabetes Sister   . Breast cancer Maternal Aunt   . Colon cancer Maternal Aunt   . Diabetes Maternal Grandmother   . Ovarian cancer Neg Hx     Social History:  reports that she has quit smoking. She does not have any smokeless tobacco history on file. She reports that she does not drink alcohol or use illicit drugs.  ROS: UROLOGY Frequent Urination?: No Hard to postpone urination?: No Burning/pain with urination?: No Get up at night to urinate?: No Leakage of urine?: No Urine stream starts and stops?: No Trouble starting stream?: No Do you have to strain to urinate?: No Blood in urine?: No Urinary tract infection?: No Sexually transmitted disease?: No Injury to kidneys or bladder?: No Painful intercourse?: No Weak stream?: No Currently pregnant?: No Vaginal bleeding?: No Last menstrual period?: n  Gastrointestinal Nausea?: No Vomiting?: No Indigestion/heartburn?: No Diarrhea?: No Constipation?: No  Constitutional Fever: No Night sweats?: No Weight loss?: No Fatigue?: Yes  Skin Skin rash/lesions?: No Itching?: No  Eyes Blurred vision?: No Double vision?: No  Ears/Nose/Throat Sore throat?: No Sinus problems?: No  Hematologic/Lymphatic Swollen glands?: No Easy bruising?: No  Cardiovascular Leg swelling?: No Chest pain?: No  Respiratory Cough?: No Shortness of breath?: No  Endocrine Excessive thirst?: No  Musculoskeletal Back pain?: Yes Joint pain?: No  Neurological Headaches?: No Dizziness?: No  Psychologic Depression?: No Anxiety?: No  Physical Exam: BP 147/85 mmHg  Pulse 83  Ht 5\' 7"  (1.702 m)  Wt 202 lb 9.6 oz (91.899 kg)  BMI 31.72 kg/m2  Constitutional: Well nourished. Alert and oriented,  No acute distress. HEENT: [place] AT, moist mucus membranes. Trachea midline, no masses. Cardiovascular: No clubbing, cyanosis, or edema. Respiratory: Normal respiratory effort, no increased work of breathing. GI: Abdomen is soft, non tender, non distended, no abdominal masses. Liver and spleen not palpable.  No hernias appreciated.  Stool sample for occult testing is not indicated.   GU: No CVA tenderness.  No bladder fullness or masses.   Skin:  No rashes, bruises or suspicious lesions. Lymph: No cervical or inguinal adenopathy. Neurologic: Grossly intact, no focal deficits, moving all 4 extremities. Psychiatric: Normal mood and affect.  Laboratory Data: Lab Results  Component Value Date   WBC 9.7 09/27/2014   HGB 14.4 09/27/2014   HCT 43.3 09/27/2014   MCV 87.1 09/27/2014   PLT 317 09/27/2014    Lab Results  Component Value Date   CREATININE 1.13* 09/27/2014     Urinalysis Results for orders placed or performed in visit on 09/07/15  Microscopic Examination  Result Value Ref Range   WBC, UA 0-5 0 -  5 /hpf   RBC, UA 0-2 0 -  2 /hpf   Epithelial Cells (non renal) 0-10 0 - 10 /hpf   Bacteria, UA Few (A) None seen/Few  Urinalysis, Complete  Result Value Ref Range   Specific Gravity, UA 1.015 1.005 - 1.030   pH, UA 8.5 (H) 5.0 - 7.5   Color, UA Yellow Yellow   Appearance Ur Cloudy (A) Clear   Leukocytes, UA Negative Negative   Protein, UA Negative Negative/Trace   Glucose, UA Negative Negative   Ketones, UA Negative Negative   RBC, UA Trace (A) Negative   Bilirubin, UA Negative Negative   Urobilinogen, Ur 0.2 0.2 - 1.0 mg/dL   Nitrite, UA Negative Negative   Microscopic Examination See below:     Pertinent Imaging: CLINICAL DATA: Initial encounter for left lower quadrant quadrant and left groin pain worsening intermittently over the last 2-3 months. Assisted with gross hematuria and dysuria with urinary frequency.  EXAM: CT ABDOMEN AND PELVIS WITHOUT  CONTRAST  TECHNIQUE: Multidetector CT imaging of the abdomen and pelvis was performed following the standard protocol without IV contrast.  COMPARISON: None.  FINDINGS: Lower chest: Unremarkable.  Hepatobiliary: No focal abnormality in the liver on this study without intravenous contrast. No evidence of hepatomegaly. Gallstones noted, measuring up to 19 mm diameter. No intrahepatic or extrahepatic biliary dilation.  Pancreas: No focal mass lesion. No dilatation of the main duct. No intraparenchymal cyst. No peripancreatic edema.  Spleen: No splenomegaly. No focal mass lesion.  Adrenals/Urinary Tract: Right adrenal gland is normal in appearance. Densely calcified 2 cm left adrenal nodule is identified. Although incompletely visualized previously, this nodule was apparent on coronal images from MRI of 06/01/2014. The lesion measures 1.8 cm in craniocaudal length on both studies.  No evidence for renal stones. No ureteral or bladder stones.  Stomach/Bowel: Stomach is nondistended. No gastric wall thickening. No evidence of outlet obstruction. Small duodenum diverticulum noted. No small bowel wall thickening. No small bowel dilatation. The terminal ileum is normal. The appendix is not visualized, but there is no edema or inflammation in the region of the cecum. No gross colonic mass. No colonic wall thickening. No substantial diverticular change.  Vascular/Lymphatic: No abdominal aortic aneurysm. No abdominal atherosclerotic calcification. There is no gastrohepatic or hepatoduodenal ligament lymphadenopathy. No intraperitoneal or retroperitoneal lymphadenopy. No pelvic sidewall lymphadenopathy.  Reproductive: Uterus surgically absent. There is no adnexal mass.  Other: No intraperitoneal free fluid.  Musculoskeletal: Mild degenerative changes noted left hip. Bone windows reveal no worrisome lytic or sclerotic osseous lesions.  IMPRESSION: 1. No acute  findings in the abdomen or pelvis. Specifically, no evidence to explain the history of left lower quadrant and groin pain with hematuria. 2. Cholelithiasis. 3. Densely calcified 2 cm left adrenal nodule, not substantially changed since 06/01/2014. This is probably benign and may be secondary to previous inflammation or hemorrhage. Given the  1 year of interval imaging stability, consider 1 additional followup CT scan of the abdomen without contrast in 12 months to confirm 2 years of stability.   Electronically Signed  By: Misty Stanley M.D.  On: 09/03/2015 11:02  Assessment & Plan:    1. Microscopic hematuria:   CT w/o did not demonstrate any renal masses, hydronephrosis or hydronephrosis.  Because of her severe allergies to contrast a CT Urogram could not be preformed, she will need to undergo cystoscopy with bilateral retrogrades in the OR.  I described to the patient how the procedure is performed and the risk associated with the procedure, such as: Infection, bleeding, uncomfortableness for the first few days after the procedure, the possibility of a biopsy of an area of concern in the ureters or bladder and the possibility of a stent placement.  I also explained the risks of general anesthesia, such as: MI, CVA, paralysis, coma and/or death.  She has a history of heart ablations, so she will need cardiac clearance prior to proceeding.  - Urinalysis, Complete - CULTURE, URINE COMPREHENSIVE - BUN + creatinine - CBC with diff/platelet  2. Left adrenal nodule:   Incidental finding on CT scan.  Radiologist recommends a repeat non contrast CT of the abdomen in one year.    3. Recurrent UTI's:   Patient has had three incidences of UTI's with Beta hemolytic Streptococcus, group B.  Her symptoms did not improve with the use of antibiotics and her urine culture with [place] was negative.  I am suspicious that organism is a skin contaminate and not an UTI.  She will be undergoing cystoscopy with  retrogrades in the future.  4. Atrophic vaginitis:   Patient is applying the vaginal estrogen cream.    Return for cystoscopy with bilateral retrogrades.  Zara Council, [facility] [address] [place], [place] [address] [phone]

## 2015-10-04 NOTE — Anesthesia Preprocedure Evaluation (Signed)
Anesthesia Evaluation  Patient identified by MRN, date of birth, ID band Patient awake    Reviewed: Allergy & Precautions, H&P , NPO status , Patient's Chart, lab work & pertinent test results, reviewed documented beta blocker date and time   History of Anesthesia Complications Negative for: history of anesthetic complications  Airway Mallampati: II  TM Distance: >3 FB Neck ROM: full    Dental no notable dental hx. (+) Teeth Intact, Caps   Pulmonary neg shortness of breath, sleep apnea and Continuous Positive Airway Pressure Ventilation , neg COPD, neg recent URI, former smoker,    Pulmonary exam normal breath sounds clear to auscultation       Cardiovascular Exercise Tolerance: Good hypertension, (-) angina(-) CAD, (-) Past MI, (-) Cardiac Stents and (-) CABG Normal cardiovascular exam+ dysrhythmias (s/p ablation) Supra Ventricular Tachycardia (-) Valvular Problems/Murmurs Rhythm:regular Rate:Normal     Neuro/Psych Seizures - (febrile as a child),  PSYCHIATRIC DISORDERS (Bipolar)  Neuromuscular disease (Idiopathic peripheral neuropathy)    GI/Hepatic negative GI ROS, Neg liver ROS,   Endo/Other  diabetes, Well Controlled  Renal/GU Renal disease (kidney stones)  negative genitourinary   Musculoskeletal   Abdominal   Peds  Hematology negative hematology ROS (+)   Anesthesia Other Findings Past Medical History:   Kidney stone                                                 SVT (supraventricular tachycardia) ([facility])                     Anxiety                                                      Arrhythmia                                                   Arthritis                                                    Facial basal cell cancer                                     Spleen anomaly                                                 Comment:compressed   Controlled diabetes mellitus ([place])                          Heart attack ([place])  High cholesterol                                             HTN (hypertension)                                           Sleep apnea                                                    Comment:CPAP   Recurrent UTI                                                Bulging lumbar disc                                          Reproductive/Obstetrics negative OB ROS                             Anesthesia Physical Anesthesia Plan  ASA: III  Anesthesia Plan: General   Post-op Pain Management:    Induction:   Airway Management Planned:   Additional Equipment:   Intra-op Plan:   Post-operative Plan:   Informed Consent: I have reviewed the patients History and Physical, chart, labs and discussed the procedure including the risks, benefits and alternatives for the proposed anesthesia with the patient or authorized representative who has indicated his/her understanding and acceptance.   Dental Advisory Given  Plan Discussed with: Anesthesiologist, CRNA and Surgeon  Anesthesia Plan Comments:         Anesthesia Quick Evaluation

## 2015-10-04 NOTE — Op Note (Signed)
Date of procedure: 10/04/2015  Preoperative diagnosis:  1. Microscopic hematuria   Postoperative diagnosis:  1. Microscopic hematuria   Procedure: 1. Cystoscopy 2. Bilateral retrograde pyelogram  Surgeon: Hollice Espy, MD  Anesthesia: General  Complications: None  Intraoperative findings: Normal bilateral retrogrades, normal cystoscopy  EBL: Minimal  Specimens: None  Drains: None  Indication: Erin Campos is a 60 y.o. patient with microscopic hematuria.  After reviewing the management options for treatment, she elected to proceed with the above surgical procedure(s). We have discussed the potential benefits and risks of the procedure, side effects of the proposed treatment, the likelihood of the patient achieving the goals of the procedure, and any potential problems that might occur during the procedure or recuperation. Informed consent has been obtained.  Description of procedure:  The patient was taken to the operating room and general anesthesia was induced.  The patient was placed in the dorsal lithotomy position, prepped and draped in the usual sterile fashion, and preoperative antibiotics were administered. A preoperative time-out was performed.   A 21 French rigid cystoscope was advanced per urethra into the bladder. The bladder was carefully inspected which revealed no evidence of masses, tumors, or lesions. The mucosa was normal. The trigone was also unremarkable with a slightly laterally displaced left UO. Attention was then turned to the right ureteral orifice which was cannulated using a 5 [place] open-ended ureteral catheter. A gentle retrograde pyelogram was performed revealing a delicate appearing ureter, decompressed upper tract collecting system without filling defects. Attention was then turned to the left side and same technique was used to perform a left retrograde pyelogram. Again, the ureter was decompressed with a normal upper tract collecting system.  There was a somewhat elongated left upper pole infundibulum otherwise, the retrograde was unremarkable.  At this point in time, the bladder was drained. The patient was repositioned the supine position, reversed from anesthesia, and taken to PACU in stable condition. There were no, dictation for this case.  Plan: Patient will follow-up with Zara Council in one year or sooner as needed.  Hollice Espy, M.D.

## 2015-10-04 NOTE — Interval H&P Note (Signed)
History and Physical Interval Note:  10/04/2015 9:11 AM  Erin Campos  has presented today for surgery, with the diagnosis of microscopic hematuria  The various methods of treatment have been discussed with the patient and family. After consideration of risks, benefits and other options for treatment, the patient has consented to  Procedure(s): CYSTOSCOPY WITH BIOPSY (N/A) CYSTOSCOPY WITH RETROGRADE PYELOGRAM (Bilateral) CYSTOSCOPY WITH STENT PLACEMENT (Bilateral) as a surgical intervention .  The patient's history has been reviewed, patient examined, no change in status, stable for surgery.  I have reviewed the patient's chart and labs.  Questions were answered to the patient's satisfaction.    RRR CTAB  Hollice Espy

## 2015-10-04 NOTE — Discharge Instructions (Signed)
Cystoscopy Cystoscopy is a procedure that is used to help your caregiver diagnose and sometimes treat conditions that affect your lower urinary tract. Your lower urinary tract includes your bladder and the tube through which urine passes from your bladder out of your body (urethra). Cystoscopy is performed with a thin, tube-shaped instrument (cystoscope). The cystoscope has lenses and a light at the end so that your caregiver can see inside your bladder. The cystoscope is inserted at the entrance of your urethra. Your caregiver guides it through your urethra and into your bladder. There are two main types of cystoscopy:  Flexible cystoscopy (with a flexible cystoscope).  Rigid cystoscopy (with a rigid cystoscope). Cystoscopy may be recommended for many conditions, including:  Urinary tract infections.  Blood in your urine (hematuria).  Loss of bladder control (urinary incontinence) or overactive bladder.  Unusual cells found in a urine sample.  Urinary blockage.  Painful urination. Cystoscopy may also be done to remove a sample of your tissue to be checked under a microscope (biopsy). It may also be done to remove or destroy bladder stones. LET YOUR CAREGIVER KNOW ABOUT:  Allergies to food or medicine.  Medicines taken, including vitamins, herbs, eyedrops, over-the-counter medicines, and creams.  Use of steroids (by mouth or creams).  Previous problems with anesthetics or numbing medicines.  History of bleeding problems or blood clots.  Previous surgery.  Other health problems, including diabetes and kidney problems.  Possibility of pregnancy, if this applies. PROCEDURE The area around the opening to your urethra will be cleaned. A medicine to numb your urethra (local anesthetic) is used. If a tissue sample or stone is removed during the procedure, you may be given a medicine to make you sleep (general anesthetic). Your caregiver will gently insert the tip of the cystoscope  into your urethra. The cystoscope will be slowly glided through your urethra and into your bladder. Sterile fluid will flow through the cystoscope and into your bladder. The fluid will expand and stretch your bladder. This gives your caregiver a better view of your bladder walls. The procedure lasts about 15-20 minutes. AFTER THE PROCEDURE If a local anesthetic is used, you will be allowed to go home as soon as you are ready. If a general anesthetic is used, you will be taken to a recovery area until you are stable. You may have temporary bleeding and burning on urination.   This information is not intended to replace advice given to you by your health care provider. Make sure you discuss any questions you have with your health care provider.   Document Released: 09/08/2000 Document Revised: 10/02/2014 Document Reviewed: 03/04/2012 Elsevier Interactive Patient Education 2016 [employer]   1) The drugs that you were given will stay in your system until tomorrow so for the next 24 hours you should not:  A) Drive an automobile B) Make any legal decisions C) Drink any alcoholic beverage  2) You may resume regular meals tomorrow.  Today it is better to start with liquids and gradually work up to solid foods.  You may eat anything you prefer, but it is better to start with liquids, then soup and crackers, and gradually work up to solid foods.  3) Please notify your doctor immediately if you have any unusual bleeding, trouble breathing, redness and pain at the surgery site, drainage, fever, or pain not relieved by medication.  4) Additional Instructions:  Please contact your physician with any problems or Same  Day Surgery at [phone], Monday through Friday 6 am to 4 pm, or [place] [facility] number at [phone].

## 2015-10-04 NOTE — Transfer of Care (Signed)
Immediate Anesthesia Transfer of Care Note  Patient: Erin Campos  Procedure(s) Performed: Procedure(s): CYSTOSCOPY WITH RETROGRADE PYELOGRAM (Bilateral)  Patient Location: PACU  Anesthesia Type:General  Level of Consciousness: awake  Airway & Oxygen Therapy: Patient Spontanous Breathing and Patient connected to face mask oxygen  Post-op Assessment: Report given to RN and Post -op Vital signs reviewed and stable  Post vital signs: Reviewed and stable  Last Vitals:  Filed Vitals:   10/04/15 0811 10/04/15 1005  BP: 135/83 132/74  Pulse: 66 64  Temp: 37 C 36 C  Resp: 16 13    Complications: No apparent anesthesia complications

## 2015-10-04 NOTE — Anesthesia Procedure Notes (Signed)
Procedure Name: LMA Insertion Date/Time: 10/04/2015 9:48 AM Performed by: Allean Found Pre-anesthesia Checklist: Patient identified, Emergency Drugs available, Suction available, Patient being monitored and Timeout performed Patient Re-evaluated:Patient Re-evaluated prior to inductionOxygen Delivery Method: Circle system utilized Preoxygenation: Pre-oxygenation with 100% oxygen Intubation Type: IV induction Ventilation: Mask ventilation without difficulty LMA Size: 4.0 Number of attempts: 1

## 2015-10-05 ENCOUNTER — Encounter: Payer: Self-pay | Admitting: Urology

## 2015-10-05 NOTE — Anesthesia Postprocedure Evaluation (Signed)
Anesthesia Post Note  Patient: Erin Campos  Procedure(s) Performed: Procedure(s) (LRB): CYSTOSCOPY WITH RETROGRADE PYELOGRAM (Bilateral)  Patient location during evaluation: PACU Anesthesia Type: General Level of consciousness: awake and alert Pain management: pain level controlled Vital Signs Assessment: post-procedure vital signs reviewed and stable Respiratory status: spontaneous breathing, nonlabored ventilation, respiratory function stable and patient connected to nasal cannula oxygen Cardiovascular status: blood pressure returned to baseline and stable Postop Assessment: no signs of nausea or vomiting Anesthetic complications: no    Last Vitals:  Filed Vitals:   10/04/15 1113 10/04/15 1136  BP: 124/83 142/83  Pulse: 61 64  Temp:    Resp:      Last Pain:  Filed Vitals:   10/05/15 0850  PainSc: 0-No pain                 Martha Clan

## 2015-10-20 DIAGNOSIS — G4733 Obstructive sleep apnea (adult) (pediatric): Secondary | ICD-10-CM | POA: Diagnosis not present

## 2015-10-21 ENCOUNTER — Other Ambulatory Visit: Payer: Self-pay | Admitting: Family Medicine

## 2015-10-21 DIAGNOSIS — Z1231 Encounter for screening mammogram for malignant neoplasm of breast: Secondary | ICD-10-CM

## 2015-10-26 ENCOUNTER — Ambulatory Visit
Admission: RE | Admit: 2015-10-26 | Discharge: 2015-10-26 | Disposition: A | Discharge status: Home / Self Care | Payer: PPO | Source: Ambulatory Visit | Attending: Family Medicine | Admitting: Family Medicine

## 2015-10-26 DIAGNOSIS — Z1231 Encounter for screening mammogram for malignant neoplasm of breast: Secondary | ICD-10-CM | POA: Diagnosis not present

## 2015-10-28 ENCOUNTER — Ambulatory Visit (INDEPENDENT_AMBULATORY_CARE_PROVIDER_SITE_OTHER): Payer: PPO | Admitting: Obstetrics and Gynecology

## 2015-10-28 ENCOUNTER — Encounter: Payer: Self-pay | Admitting: Obstetrics and Gynecology

## 2015-10-28 VITALS — BP 133/88 | HR 69 | Ht 67.0 in | Wt 190.6 lb

## 2015-10-28 DIAGNOSIS — N941 Unspecified dyspareunia: Secondary | ICD-10-CM

## 2015-10-28 DIAGNOSIS — N9089 Other specified noninflammatory disorders of vulva and perineum: Secondary | ICD-10-CM | POA: Diagnosis not present

## 2015-10-28 DIAGNOSIS — N952 Postmenopausal atrophic vaginitis: Secondary | ICD-10-CM | POA: Diagnosis not present

## 2015-10-28 DIAGNOSIS — N816 Rectocele: Secondary | ICD-10-CM

## 2015-10-28 DIAGNOSIS — N8110 Cystocele, unspecified: Secondary | ICD-10-CM

## 2015-10-28 DIAGNOSIS — IMO0002 Reserved for concepts with insufficient information to code with codable children: Secondary | ICD-10-CM

## 2015-10-28 NOTE — Progress Notes (Signed)
Chief complaint: 1.  Rectocele. 2.  Vaginal atrophy.  Patient presents for 6 week follow-up after therapy initiated.  for  Symptomatic rectocele.  Patient is taking 200 mg, Colace a day along with Metamucil daily.  She has increased her water intake.  Bowel function has improved to where she is having bowel movements every day to every other day.  Occasionally she still will have to strain to initiate a bowel movement.  Overall, she feels much better. Vaginal atrophy symptoms are less significant at this time.  Initially when starting on vaginal estrogen therapy, she noted moderate discomfort and developed side effects including depressive type symptoms on the nightly estrogen cream.  Since going to twice weekly applications.  She has had resolution of those depressive symptoms.  Past medical history, past surgical history, problems, medications, and allergies are reviewed.  Review of systems: As noted in HPI.  OBJECTIVE: BP 133/88 mmHg  Pulse 69  Ht 5\' 7"  (1.702 m)  Wt 190 lb 9.6 oz (86.456 kg)  BMI 29.85 kg/m2 Pleasant, well-appearing white female in no acute distress. PELVIC: External Genitalia: Normal; Multiple hemangiomas on labia majora bilaterally; 7 mm papule right gluteus, pedunculated BUS: Atrophic Vagina: Atrophy present, mild; first degree cystocele; moderate rectocele Cervix: Surgically absent Uterus: Surgically absent Adnexa: Normal RV: Deferred Bladder: Nontender  ASSESSMENT: 1.  Vaginal atrophy, improving. 2.  Suboptimal tolerance to estrogen cream. 3.  Moderate rectocele, improved with dietary modifications. 4.  Vulvar lesion, right gluteus.  PLAN: 1.  Trial of Osphena 2.  Continue with dietary modification, stool softeners, and fiber additive. 3.  Return in 2 months for follow-up and right gluteus biopsy  Brayton Mars, MD  Note: This dictation was prepared  with Dragon dictation along with smaller phrase technology. Any transcriptional errors that result from this process are unintentional.

## 2015-10-28 NOTE — Patient Instructions (Addendum)
1.  Continue with Colace 200 mg a day; continue with Metamucil daily; continue with increased water intake. 2.  Osphena 1 tablet daily. 3.  Return in 2 months for vulvar biopsy and follow up on Osphena

## 2015-11-08 DIAGNOSIS — L739 Follicular disorder, unspecified: Secondary | ICD-10-CM | POA: Diagnosis not present

## 2015-11-08 DIAGNOSIS — L720 Epidermal cyst: Secondary | ICD-10-CM | POA: Diagnosis not present

## 2015-11-15 ENCOUNTER — Encounter: Payer: Self-pay | Admitting: Emergency Medicine

## 2015-11-15 DIAGNOSIS — N816 Rectocele: Secondary | ICD-10-CM | POA: Insufficient documentation

## 2015-11-15 DIAGNOSIS — Z79899 Other long term (current) drug therapy: Secondary | ICD-10-CM | POA: Insufficient documentation

## 2015-11-15 DIAGNOSIS — E119 Type 2 diabetes mellitus without complications: Secondary | ICD-10-CM | POA: Diagnosis not present

## 2015-11-15 DIAGNOSIS — Z87891 Personal history of nicotine dependence: Secondary | ICD-10-CM | POA: Insufficient documentation

## 2015-11-15 DIAGNOSIS — Z7982 Long term (current) use of aspirin: Secondary | ICD-10-CM | POA: Insufficient documentation

## 2015-11-15 DIAGNOSIS — I10 Essential (primary) hypertension: Secondary | ICD-10-CM | POA: Insufficient documentation

## 2015-11-15 DIAGNOSIS — K623 Rectal prolapse: Secondary | ICD-10-CM | POA: Diagnosis not present

## 2015-11-15 DIAGNOSIS — K625 Hemorrhage of anus and rectum: Secondary | ICD-10-CM | POA: Diagnosis not present

## 2015-11-15 LAB — COMPREHENSIVE METABOLIC PANEL
ALBUMIN: 4.6 g/dL (ref 3.5–5.0)
ALK PHOS: 50 U/L (ref 38–126)
ALT: 18 U/L (ref 14–54)
ANION GAP: 6 (ref 5–15)
AST: 29 U/L (ref 15–41)
BUN: 16 mg/dL (ref 6–20)
CALCIUM: 10.2 mg/dL (ref 8.9–10.3)
CO2: 30 mmol/L (ref 22–32)
Chloride: 107 mmol/L (ref 101–111)
Creatinine, Ser: 1.03 mg/dL — ABNORMAL HIGH (ref 0.44–1.00)
GFR calc Af Amer: >60 mL/min (ref >60)
GFR calc non Af Amer: 59 mL/min — ABNORMAL LOW (ref >60)
GLUCOSE: 102 mg/dL — AB (ref 65–99)
Potassium: 3.5 mmol/L (ref 3.5–5.1)
SODIUM: 143 mmol/L (ref 135–145)
Total Bilirubin: 0.3 mg/dL (ref 0.3–1.2)
Total Protein: 7.9 g/dL (ref 6.5–8.1)

## 2015-11-15 LAB — CBC
HEMATOCRIT: 38.4 % (ref 35.0–47.0)
HEMOGLOBIN: 12.8 g/dL (ref 12.0–16.0)
MCH: 30 pg (ref 26.0–34.0)
MCHC: 33.3 g/dL (ref 32.0–36.0)
MCV: 89.9 fL (ref 80.0–100.0)
Platelets: 326 10*3/uL (ref 150–440)
RBC: 4.28 MIL/uL (ref 3.80–5.20)
RDW: 13.3 % (ref 11.5–14.5)
WBC: 11.8 10*3/uL — ABNORMAL HIGH (ref 3.6–11.0)

## 2015-11-15 NOTE — ED Notes (Signed)
Pt arrived tot he ED for complaints of rectal bleeding with a HX of hemorrhoids and heavy lifting in the past couple of days. Pt is AOx4 in no apparent distress.

## 2015-11-16 ENCOUNTER — Encounter: Payer: Self-pay | Admitting: Obstetrics and Gynecology

## 2015-11-16 ENCOUNTER — Ambulatory Visit (INDEPENDENT_AMBULATORY_CARE_PROVIDER_SITE_OTHER): Payer: PPO | Admitting: Obstetrics and Gynecology

## 2015-11-16 ENCOUNTER — Emergency Department
Admission: EM | Admit: 2015-11-16 | Discharge: 2015-11-16 | Disposition: A | Discharge status: Home / Self Care | Payer: PPO | Attending: Emergency Medicine | Admitting: Emergency Medicine

## 2015-11-16 VITALS — BP 171/92 | HR 96 | Ht 67.0 in | Wt 187.4 lb

## 2015-11-16 DIAGNOSIS — N816 Rectocele: Secondary | ICD-10-CM

## 2015-11-16 DIAGNOSIS — K623 Rectal prolapse: Secondary | ICD-10-CM | POA: Diagnosis not present

## 2015-11-16 DIAGNOSIS — I471 Supraventricular tachycardia: Secondary | ICD-10-CM | POA: Insufficient documentation

## 2015-11-16 MED ORDER — DIAZEPAM 2 MG PO TABS: 2.0000 mg | ORAL_TABLET | Freq: Three times a day (TID) | ORAL | Status: DC | PRN

## 2015-11-16 MED ORDER — HYDROCORTISONE ACE-PRAMOXINE 1-1 % RE FOAM: 1.0000 | Freq: Two times a day (BID) | RECTAL | Status: DC

## 2015-11-16 NOTE — Progress Notes (Signed)
Chief complaint: 1. Rectal prolapse 2. Rectocele  Patient presents for follow-up from the emergency room last night when she was seen for acute rectal prolapse. The prolapse was reduced with osmotic therapy (Sugar). She has since had a small bowel movement today without return of the prolapse. Patient is taking Colace 200 mg once or twice a day and is using a fiber additive.she did have some low back pain and levator spasm which has improved following Valium therapy.  Emergency room documentation was reviewed. Past medical history, past surgical history, problem list, medications, and allergies. Review of systems: Per history of present illness  OBJECTIVE: BP 171/92 mmHg  Pulse 96  Ht 5\' 7"  (1.702 m)  Wt 187 lb 6.4 oz (85.004 kg)  BMI 29.34 kg/m2 Pleasant white female in no acute distress. She is alert and oriented. Pelvic exam: External genitalia-5 mm papule is noted on right gluteus. External genitalia normal BUS-normal Vagina-mild atrophy; moderate rectocele with palpable stool in the rectal vault Rectovaginal-external exam notable for minimal rectal prolapse (1 cm). Digital exam notable for moderate sphincter tone and abundant soft stool in the rectal vault; no rectal masses  ASSESSMENT: 1. Rectal prolapse, acute episode last p.m., resolved with osmotic therapy. 2. Rectocele, moderate, with fair response to dietary modification, stool softeners, and fiber additive.  PLAN: 1. Continue with water intake, stool softeners, fiber 2. Analpram samples given 3. General surgery consultation for rectal prolapse 4. Use Valium as prescribed for rectal spasm. 5. Return in 1 month for follow-up on rectocele  A total of 15 minutes were spent face-to-face with the patient during this encounter and over half of that time dealt with counseling and coordination of care.  Brayton Mars, MD  Note: This dictation was prepared with Dragon dictation along with smaller phrase technology. Any  transcriptional errors that result from this process are unintentional.

## 2015-11-16 NOTE — ED Provider Notes (Signed)
[facility] Emergency Department Provider Note  ____________________________________________  Time seen: Approximately 12:57 AM  I have reviewed the triage vital signs and the nursing notes.   HISTORY  Chief Complaint Rectal Bleeding and Hemorrhoids    HPI Erin Campos is a 60 y.o. female with a past medical history that includes a rectocele which is being managed conservatively by Dr. Enzo Bi who presents with acute onset of rectal pressure and pain earlier today.  She states that she has been lifting some heavy boxes during a move from today and felt urgent pressure and the need to have a bowel movement.  She went to have a bowel movement and felt she could not do so and had pain and some swelling in the area.  She tried to use an enema but it was not successful and caused pain which she describes as sharp and burning.  When she looked and felt in the area it appeared that she had a rectal prolapse so she came to the emergency department for further evaluation.  She denies fever/chills, chest pain, shortness of breath, abdominal pain, dysuria.  She has had similar issues in the past but it is never been this prominent or severe.  She states that after she was lying supine in the exam bed the pain slacked off but it is still moderate.  Moving around or straining makes it worse.   Past Medical History  Diagnosis Date  . Kidney stone   . SVT (supraventricular tachycardia) ([place])   . Anxiety   . Arrhythmia   . Arthritis   . Facial basal cell cancer   . Spleen anomaly     compressed  . Controlled diabetes mellitus ([place])   . Heart attack ([place])   . High cholesterol   . HTN (hypertension)   . Sleep apnea     CPAP  . Recurrent UTI   . Bulging lumbar disc     Patient Active Problem List   Diagnosis Date Noted  . Vulvar lesion 10/28/2015  . Dyspareunia in female 10/28/2015  . Adrenal nodule ([place]) 09/12/2015  . Bipolar I disorder ([place]) 08/26/2015   . Essential (primary) hypertension 08/26/2015  . Arthralgia of hip 08/26/2015  . H/O disease 08/26/2015  . Decreased potassium in the blood 08/26/2015  . Idiopathic peripheral neuropathy ([place]) 08/26/2015  . Rectocele 08/26/2015  . Cystocele 08/26/2015  . Vaginal atrophy 08/26/2015  . Recurrent UTI 08/24/2015  . Microscopic hematuria 08/24/2015  . Atrophic vaginitis 08/24/2015  . Degeneration of intervertebral disc of lumbar region 11/20/2014  . Combined fat and carbohydrate induced hyperlipemia 11/20/2014  . Type 2 diabetes mellitus ([place]) 02/13/2013  . Family history of thyroid disease 02/13/2013  . Hypomagnesemia 02/13/2013  . Degenerative arthritis of hip 02/06/2013    Past Surgical History  Procedure Laterality Date  . Heart ablation    . Kidney stone surgery      stents  . Cosmetic surgery  2014    Lip  . Basal cell carcinoma excision      face  . Bladder tack  2004  . Vaginal hysterectomy    . Cystoscopy w/ retrogrades Bilateral 10/04/2015    Procedure: CYSTOSCOPY WITH RETROGRADE PYELOGRAM;  Surgeon: Hollice Espy, MD;  Location: [facility] ORS;  Service: Urology;  Laterality: Bilateral;    Current Outpatient Rx  Name  Route  Sig  Dispense  Refill  . aspirin 81 MG chewable tablet   Oral   Chew 81 mg by mouth daily.          Marland Kitchen  atenolol (TENORMIN) 25 MG tablet   Oral   Take 25 mg by mouth daily.         . carisoprodol (SOMA) 350 MG tablet   Oral   Take 350 mg by mouth as needed.          . diazepam (VALIUM) 2 MG tablet   Oral   Take 1 tablet (2 mg total) by mouth every 8 (eight) hours as needed for muscle spasms.   20 tablet   0   . estradiol (ESTRACE) 0.1 MG/GM vaginal cream   Vaginal   Place 1 Applicatorful vaginally at bedtime. Patient taking differently: Place 1 Applicatorful vaginally 3 (three) times a week.    42.5 g   4   . fenofibrate (TRICOR) 145 MG tablet   Oral   Take 145 mg by mouth at bedtime.       4   . glucose blood test strip                . hydrocortisone-pramoxine (PROCTOFOAM HC) rectal foam   Rectal   Place 1 applicator rectally 2 (two) times daily.   10 g   0   . KLOR-CON M20 20 MEQ tablet   Oral   Take 20 mEq by mouth daily.      3   . losartan (COZAAR) 50 MG tablet   Oral   Take 50 mg by mouth daily.      5   . Magnesium 500 MG CAPS   Oral   Take 500 mg by mouth daily.          . niacin 500 MG tablet   Oral   Take 500 mg by mouth at bedtime.         . nortriptyline (PAMELOR) 75 MG capsule   Oral   Take 75 mg by mouth every morning.          Marland Kitchen oxyCODONE (OXY IR/ROXICODONE) 5 MG immediate release tablet      TAKE 1 TABLET BY ORAL ROUTE EVERY 6 HOURS AS NEEDED (DNF 08/21/15)      0   . temazepam (RESTORIL) 15 MG capsule   Oral   Take 15 mg by mouth at bedtime as needed. for sleep      2   . triamterene-hydrochlorothiazide (MAXZIDE-25) 37.5-25 MG per tablet   Oral   Take 1 tablet by mouth every morning.      3     Allergies Sulfa antibiotics; Demerol; Flu virus vaccine; Ivp dye; Nsaids; and Ketorolac tromethamine  Family History  Problem Relation Age of Onset  . Kidney Stones Father   . Hematuria Father   . Heart failure Father   . Heart failure Mother     Father  . Melanoma Mother   . Diabetes Mother   . Diabetes Sister   . Breast cancer Maternal Aunt   . Colon cancer Maternal Aunt   . Diabetes Maternal Grandmother   . Ovarian cancer Neg Hx     Social History Social History  Substance Use Topics  . Smoking status: Former Smoker -- 0.25 packs/day    Types: Cigarettes  . Smokeless tobacco: Never Used     Comment: quit 20 years  . Alcohol Use: No    Review of Systems Constitutional: No fever/chills Eyes: No visual changes. ENT: No sore throat. Cardiovascular: Denies chest pain. Respiratory: Denies shortness of breath. Gastrointestinal: No abdominal pain.  No nausea, no vomiting.  Rectal pain and pressure w/  a bulge concerning for rectal  prolapse Genitourinary: Negative for dysuria. Musculoskeletal: Negative for back pain. Skin: Negative for rash. Neurological: Negative for headaches, focal weakness or numbness.  10-point ROS otherwise negative.  ____________________________________________   PHYSICAL EXAM:  VITAL SIGNS: ED Triage Vitals  Enc Vitals Group     BP 11/15/15 2005 155/89 mmHg     Pulse Rate 11/15/15 2005 79     Resp 11/15/15 2005 18     Temp 11/15/15 2005 98.5 F (36.9 C)     Temp Source 11/15/15 2005 Oral     SpO2 11/15/15 2005 100 %     Weight 11/15/15 2005 176 lb (79.833 kg)     Height 11/15/15 2005 5\' 7"  (123XX123 m)     Head Cir --      Peak Flow --      Pain Score 11/15/15 2006 8     Pain Loc --      Pain Edu? --      Excl. in [place]? --     Constitutional: Alert and oriented. Well appearing and in no acute distress. Eyes: Conjunctivae are normal. PERRL. EOMI. Head: Atraumatic. Cardiovascular: Normal rate, regular rhythm. Grossly normal heart sounds.  Good peripheral circulation. Respiratory: Normal respiratory effort.  No retractions. Lungs CTAB. Gastrointestinal: Soft and nontender. No distention. No abdominal bruits. No CVA tenderness. Genitourinary/Rectal:  Partial rectal prolapse with some irritation but no gross blood.  Mild tenderness to palpation on digital exam, again with no gross blood.  Rectal vault normal with no impaction.  See Procedures below for more details. Musculoskeletal: No lower extremity tenderness nor edema.  No joint effusions. Neurologic:  Normal speech and language. No gross focal neurologic deficits are appreciated.  Skin:  Skin is warm, dry and intact. No rash noted. Psychiatric: Mood and affect are normal. Speech and behavior are normal.  ____________________________________________   LABS (all labs ordered are listed, but only abnormal results are displayed)  Labs Reviewed  COMPREHENSIVE METABOLIC PANEL - Abnormal; Notable for the following:    Glucose,  Bld 102 (*)    Creatinine, Ser 1.03 (*)    GFR calc non Af Amer 59 (*)    All other components within normal limits  CBC - Abnormal; Notable for the following:    WBC 11.8 (*)    All other components within normal limits   ____________________________________________  EKG  None ____________________________________________  RADIOLOGY   No results found.  ____________________________________________   PROCEDURES  Procedure(s) performed: Prolapse reduction.  I initially applied gentle pressure with some lubricating ointment on the finger and the small amount of rectal prolapse did reduce but immediately came back out.  I then applied some granulated sugar to the prolapse and waited a couple of minutes and then applied gentle pressure.  This time, due to the osmotic effect of the sugar, the prolapse fully reduced and stayed reduced after I removed my finger.  The patient reported immediate resolution of the pain and pressure she was experiencing in her rectum.  Critical Care performed: No ____________________________________________   INITIAL IMPRESSION / ASSESSMENT AND PLAN / ED COURSE  Pertinent labs & imaging results that were available during my care of the patient were reviewed by me and considered in my medical decision making (see chart for details).  I discussed with the patient the need to continue using Colace 100 mg twice a day to soften her stools and to avoid any strenuous activity or bearing down as much as possible.  I  explained it is very likely that she will once again prolapse but that this can be managed as an outpatient and there is no emergent procedure that we will fix it.  She will follow up with Dr. Enzo Bi and I also provided the contact information for Dr. Adonis Huguenin indication he see a general Psychologist, sport and exercise.  I provided a prescription for Proctofoam as well as for Valium 2 mg tablets which may help for the rectal spasms.  I advised the use of sitz baths as  well.  She understands and agrees with the plan.  ____________________________________________  FINAL CLINICAL IMPRESSION(S) / ED DIAGNOSES  Final diagnoses:  Rectocele  Incomplete rectal prolapse      NEW MEDICATIONS STARTED DURING THIS VISIT:  New Prescriptions   DIAZEPAM (VALIUM) 2 MG TABLET    Take 1 tablet (2 mg total) by mouth every 8 (eight) hours as needed for muscle spasms.   HYDROCORTISONE-PRAMOXINE (PROCTOFOAM HC) RECTAL FOAM    Place 1 applicator rectally 2 (two) times daily.     Hinda Kehr, MD 11/16/15 [phone]

## 2015-11-16 NOTE — Patient Instructions (Signed)
1. Continue with water intake, Colace twice a day, and fiber additive. 2. Use Valium as necessary for rectal spasm 3. Analpram samples given 4. General surgery consultation regarding rectal prolapse 5. Return next month as scheduled for follow-up on rectocele.

## 2015-11-16 NOTE — ED Notes (Signed)
Karma Greaser, MD and Daiva Nakayama, RN at bedside.

## 2015-11-16 NOTE — Discharge Instructions (Signed)
As we discussed, you had a partial prolapse of the rectum tonight.  By the time of your evaluation it was minimal and I was able to reduce it.  However, we recommend that he follow-up as soon as possible with your OB/GYN provider or with general surgery to discuss additional management options.  Continue to take Colace 100 mg twice a day to soften your stools.  Use the prescribed Proctofoam as needed.  Try to minimize straining and bearing down as much as possible.  Try sitting in warm sitz baths which may help the discomfort and inflammation.  We also prescribed Valium 2 mg tablets which may help if you are experience rectal spasms.  However, do not drink alcohol, drive or participate in any other potentially dangerous activities while taking this medication as it may make you sleepy. Do not take this medication with any other sedating medications, either prescription or over-the-counter.    Rectal Prolapse, Adult Rectal prolapse happens when the inside of the final section of the large intestine (rectum) pushes out through the anal opening. With this condition, the lower part of the rectum turns inside out. At first, rectal prolapse may be temporary. It may happen only when you are having a bowel movement. Over time, the prolapse will likely get worse. It may start to happen more often and cause uncomfortable symptoms. Eventually, the prolapse may happen when you are walking or simply standing. Surgery is often needed for this condition. CAUSES  This condition may result from weakness of the muscles that attach the rectum to the inside of the lower abdomen. The exact cause of this muscle weakness is not known.  RISK FACTORS This condition is more likely to develop in:  Women who are 36 years of age or older.  People with a history of constipation.  People with a history of hemorrhoids.  People who have a lower spinal cord injury.  Women who have been pregnant many times.  People who have had  rectal surgery.  Men who have an enlarged prostate gland.  People who have chronic obstructive pulmonary disease (COPD).  People who have cystic fibrosis. SYMPTOMS The main symptom of this condition is a red bump of tissue sticking out from your anus. At first, the bump may only appear after a bowel movement. It may then start to appear more often. Other symptoms may include:  Discomfort in the anus and rectum.  Constipation.  Diarrhea.  Inability to control bowel movements (incontinence).  Rectal bleeding. DIAGNOSIS  This condition may be diagnosed based on your symptoms and a physical exam. During the exam, you may be asked to squat and strain as though you are having a bowel movement. You may also have tests, such as:  A rectal exam using a flexible scope (sigmoidoscopy or colonoscopy).  A procedure that involves taking X-rays of your rectum after a dye (contrast material) is injected into the rectum (defecogram). TREATMENT  This condition is usually treated with surgery to repair the weakened muscles and to reconnect the rectum to attachments inside the lower abdomen. Other treatment options may include:  Pushing the prolapsed area back into the rectum (reduction). Your health care provider may do this by gently pushing it back in using a moist cloth. The health care provider may also show you how to do this at home if the prolapse occurs again.  Medicines to prevent constipation and straining. This may include laxatives or stool softeners. HOME CARE INSTRUCTIONS General Instructions  Take over-the-counter and  prescription medicines only as told by your health care provider.  Do not strain to have a bowel movement.  Do not lift anything that is heavier than 10 lb (4.5 kg).  Follow instructions from your health care provider about what to do if the prolapse occurs again and does not go back in. This may involve lying on your side and using a moist cloth to gently press the  lump into your rectum.  Keep all follow-up visits as told by your health care provider. This is important. Preventing Constipation  Eat foods that have a lot of fiber, such as fruits, vegetables, whole grains, and beans.  Limit foods high in fat and processed sugars, such as french fries, hamburgers, cookies, candies, and soda.  Drink enough fluids to keep your urine clear or pale yellow. SEEK MEDICAL CARE IF:  You have a fever.  Your prolapse cannot be reduced at home.  You have constipation or diarrhea.  You have mild rectal bleeding. SEEK IMMEDIATE MEDICAL CARE IF:  You have very bad rectal pain.  You bleed heavily from your rectum.   This information is not intended to replace advice given to you by your health care provider. Make sure you discuss any questions you have with your health care provider.   Document Released: 06/02/2015 Document Reviewed: 09/30/2014 Elsevier Interactive Patient Education [employer].

## 2015-11-20 DIAGNOSIS — G4733 Obstructive sleep apnea (adult) (pediatric): Secondary | ICD-10-CM | POA: Diagnosis not present

## 2015-11-23 ENCOUNTER — Encounter: Payer: Self-pay | Admitting: General Surgery

## 2015-11-23 ENCOUNTER — Ambulatory Visit (INDEPENDENT_AMBULATORY_CARE_PROVIDER_SITE_OTHER): Payer: PPO | Admitting: General Surgery

## 2015-11-23 ENCOUNTER — Telehealth: Payer: Self-pay | Admitting: General Surgery

## 2015-11-23 VITALS — BP 131/74 | HR 72 | Temp 97.2°F | Ht 67.0 in | Wt 184.0 lb

## 2015-11-23 DIAGNOSIS — K623 Rectal prolapse: Secondary | ICD-10-CM

## 2015-11-23 DIAGNOSIS — N816 Rectocele: Secondary | ICD-10-CM | POA: Diagnosis not present

## 2015-11-23 NOTE — Progress Notes (Signed)
Patient ID: Erin Campos, female   DOB: December 09, 1955, 60 y.o.   MRN: [id number]  CC: rectal prolapse  HPI Erin Campos is a 60 y.o. female who presents to clinic today for evaluation of rectal prolapse. Patient states that she was moving household goods in her new house when she had a sudden urge for bowel movement. She went and strained and pushed for a bowel movement which she noticed a fullness and then a large volume rectal prolapse. She presented to the emergency department for this and was able to have it reduced using osmotic therapy. She's never had a rectal prolapse before. Although she does have a history of cystocele and rectocele for which she has had 2 become created to have a bowel movement sometimes with different positioning and pressures. She states that she's been having rectal issues since delivering her children vaginally. She states that her children were large with one being over 10 pounds. She's also had a hysterectomy. In this time. She also thinks she had an external hemorrhoid at one time. She is mostly scared that the prolapse can occur again however it has remained reduced since her trip to the emergency department. She denies any fevers, chills, nausea, vomiting, chest pain, shortness of breath. She is maintaining regular bowel movements using fiber and water. She denies any diarrhea or constipation currently.  HPI  Past Medical History  Diagnosis Date  . Kidney stone   . SVT (supraventricular tachycardia) ([place])   . Anxiety   . Arrhythmia   . Arthritis   . Facial basal cell cancer   . Spleen anomaly     compressed  . Controlled diabetes mellitus ([place])   . Heart attack ([place])   . High cholesterol   . HTN (hypertension)   . Sleep apnea     CPAP  . Recurrent UTI   . Bulging lumbar disc   . Cystocele   . Rectocele   . External hemorrhoids     Past Surgical History  Procedure Laterality Date  . Heart ablation    . Kidney stone surgery      stents  .  Cosmetic surgery  2014    Lip  . Basal cell carcinoma excision      face  . Bladder tack  2004  . Vaginal hysterectomy    . Cystoscopy w/ retrogrades Bilateral 10/04/2015    Procedure: CYSTOSCOPY WITH RETROGRADE PYELOGRAM;  Surgeon: Erin Espy, MD;  Location: [facility] ORS;  Service: Urology;  Laterality: Bilateral;    Family History  Problem Relation Age of Onset  . Kidney Stones Father   . Hematuria Father   . Heart failure Father   . Heart failure Mother     Father  . Melanoma Mother   . Diabetes Mother   . Diabetes Sister   . Breast cancer Maternal Aunt   . Colon cancer Maternal Aunt   . Diabetes Maternal Grandmother   . Ovarian cancer Neg Hx     Social History Social History  Substance Use Topics  . Smoking status: Former Smoker -- 0.25 packs/day    Types: Cigarettes  . Smokeless tobacco: Never Used     Comment: quit 20 years  . Alcohol Use: No    Allergies  Allergen Reactions  . Sulfa Antibiotics Shortness Of Breath    "hives"  . Demerol [Meperidine] Other (See Comments)    "seizures"  . Flu Virus Vaccine Other (See Comments)    Seizure, Drawing up of hands/feet  .  Ivp Dye [Iodinated Diagnostic Agents] Other (See Comments)    "chest pains and irregular heartbeat"," cannot tolerate intravenous"  . Nsaids Other (See Comments)    "GI problems"  . Ketorolac Tromethamine Palpitations    palpitations    Current Outpatient Prescriptions  Medication Sig Dispense Refill  . aspirin 81 MG chewable tablet Chew 81 mg by mouth daily.     Marland Kitchen atenolol (TENORMIN) 25 MG tablet Take 25 mg by mouth daily.    . carisoprodol (SOMA) 350 MG tablet Take 350 mg by mouth as needed.     . docusate sodium (COLACE) 100 MG capsule Take 100 mg by mouth daily.    Marland Kitchen estradiol (ESTRACE) 0.1 MG/GM vaginal cream Place 1 Applicatorful vaginally at bedtime. (Patient taking differently: Place 1 Applicatorful vaginally 3 (three) times a week. ) 42.5 g 4  . fenofibrate (TRICOR) 145 MG tablet Take  145 mg by mouth at bedtime.   4  . glucose blood test strip     . KLOR-CON M20 20 MEQ tablet Take 20 mEq by mouth daily.  3  . losartan (COZAAR) 50 MG tablet Take 50 mg by mouth daily.  5  . Magnesium 500 MG CAPS Take 500 mg by mouth daily.     . niacin 500 MG tablet Take 500 mg by mouth at bedtime.    . nortriptyline (PAMELOR) 75 MG capsule Take 75 mg by mouth every morning.     Marland Kitchen oxyCODONE (OXY IR/ROXICODONE) 5 MG immediate release tablet TAKE 1 TABLET BY ORAL ROUTE EVERY 6 HOURS AS NEEDED (DNF 08/21/15)  0  . temazepam (RESTORIL) 15 MG capsule Take 7.5 mg by mouth at bedtime as needed. for sleep  2  . triamterene-hydrochlorothiazide (MAXZIDE-25) 37.5-25 MG per tablet Take 1 tablet by mouth every morning.  3   No current facility-administered medications for this visit.     Review of Systems A multi-point review of systems was asked and was negative except for the findings documented in the history of present illness  Physical Exam Blood pressure 131/74, pulse 72, temperature 97.2 F (36.2 C), temperature source Oral, height 5\' 7"  (1.702 m), weight 83.462 kg (184 lb). CONSTITUTIONAL: No acute distress. EYES: Pupils are equal, round, and reactive to light, Sclera are non-icteric. EARS, NOSE, MOUTH AND THROAT: The oropharynx is clear. The oral mucosa is pink and moist. Hearing is intact to voice. LYMPH NODES:  Lymph nodes in the neck are normal. RESPIRATORY:  Lungs are clear. There is normal respiratory effort, with equal breath sounds bilaterally, and without pathologic use of accessory muscles. CARDIOVASCULAR: Heart is regular without murmurs, gallops, or rubs. GI: The abdomen is  soft, nontender, and nondistended. There are no palpable masses. There is no hepatosplenomegaly. There are normal bowel sounds in all quadrants. GU: Rectal exam performed. There is evidence of decompressed former thrombosed external hemorrhoids. There is no visible rectal prolapse at this time. Her pelvic  floor muscles are noted to be lax in all directions. It is easy to push for him to her rectocele. There is minimal appreciable internal hemorrhoids. There is no evidence of active bleeding or excessive stool on examination.   MUSCULOSKELETAL: Normal muscle strength and tone. No cyanosis or edema.   SKIN: Turgor is good and there are no pathologic skin lesions or ulcers. NEUROLOGIC: Motor and sensation is grossly normal. Cranial nerves are grossly intact. PSYCH:  Oriented to person, place and time. Affect is normal.  Data Reviewed I reviewed the patient's labs which are  most within normal limits. There is no imaging available for this visit. I have personally reviewed the patient's imaging, laboratory findings and medical records.    Assessment    60 year old female with rectal prolapse in the setting of rectocele and cystocele.    Plan    60 year old female with rectal prolapse. Discussed with patient the numerous causes of rectal prolapse and the indications for surgical intervention. Given the laxity of her pelvic floor muscles on examination discussed that pelvic floor physical therapy could be of assistance for this patient. Stated in some cases appropriate physical therapy for pelvic floor can prevent further rectal prolapse. However, given her young age and her multiple reported other problems including her rectocele and cystocele discussed with her that it may be prudent for her to seek evaluation from a colorectal specialist. Plan to place outpatient referrals to both physical therapy and cold rectal surgery. Discussed that in the meantime should the rectal prolapse recurred and not be able to be reduced that she is to return to the emergency department immediately for further evaluation. Stated that although rare, emergent surgery for rectal prolapse is sometimes required. All questions were answered to the patient's satisfaction and she'll follow up with [place] on an as-needed basis.      Time spent with the patient was 60 minutes, with more than 50% of the time spent in face-to-face education, counseling and care coordination.     Clayburn Pert, MD FACS General Surgeon 11/23/2015, 10:07 AM

## 2015-11-23 NOTE — Patient Instructions (Signed)
In the meantime, if this happens again, please go to the ED if you are not able to stick it back in again.  We or the referring office will be contacting you with your appointments.  If you have questions, please do not hesitate to call us.

## 2015-11-23 NOTE — Telephone Encounter (Signed)
I have called patient and informed her of the referrals that Dr. Adonis Huguenin recommended.   [facility] Appointment made: 12/13/15 @ 9:30 am [facility] -- 1st Floor Dr. Sharol Roussel  Patient was told to expect a call from [place] for an appointment for Pelvic Floor Physical Therapy. I have sent a referral through [facility].

## 2015-11-26 DIAGNOSIS — E1121 Type 2 diabetes mellitus with diabetic nephropathy: Secondary | ICD-10-CM | POA: Diagnosis not present

## 2015-11-26 DIAGNOSIS — F419 Anxiety disorder, unspecified: Secondary | ICD-10-CM | POA: Diagnosis not present

## 2015-11-26 DIAGNOSIS — M5136 Other intervertebral disc degeneration, lumbar region: Secondary | ICD-10-CM | POA: Diagnosis not present

## 2015-11-26 DIAGNOSIS — I10 Essential (primary) hypertension: Secondary | ICD-10-CM | POA: Diagnosis not present

## 2015-11-26 DIAGNOSIS — E782 Mixed hyperlipidemia: Secondary | ICD-10-CM | POA: Diagnosis not present

## 2015-11-26 DIAGNOSIS — G2581 Restless legs syndrome: Secondary | ICD-10-CM | POA: Diagnosis not present

## 2015-11-26 DIAGNOSIS — G4733 Obstructive sleep apnea (adult) (pediatric): Secondary | ICD-10-CM | POA: Diagnosis not present

## 2015-12-01 DIAGNOSIS — E1121 Type 2 diabetes mellitus with diabetic nephropathy: Secondary | ICD-10-CM | POA: Diagnosis not present

## 2015-12-01 DIAGNOSIS — E782 Mixed hyperlipidemia: Secondary | ICD-10-CM | POA: Diagnosis not present

## 2015-12-06 DIAGNOSIS — M47816 Spondylosis without myelopathy or radiculopathy, lumbar region: Secondary | ICD-10-CM | POA: Diagnosis not present

## 2015-12-06 DIAGNOSIS — M5136 Other intervertebral disc degeneration, lumbar region: Secondary | ICD-10-CM | POA: Diagnosis not present

## 2015-12-10 ENCOUNTER — Ambulatory Visit: Payer: PPO | Attending: General Surgery | Admitting: Physical Therapy

## 2015-12-10 VITALS — BP 100/60

## 2015-12-10 DIAGNOSIS — R293 Abnormal posture: Secondary | ICD-10-CM | POA: Diagnosis not present

## 2015-12-10 DIAGNOSIS — R29898 Other symptoms and signs involving the musculoskeletal system: Secondary | ICD-10-CM | POA: Diagnosis not present

## 2015-12-10 DIAGNOSIS — M6289 Other specified disorders of muscle: Secondary | ICD-10-CM

## 2015-12-10 DIAGNOSIS — N8184 Pelvic muscle wasting: Secondary | ICD-10-CM | POA: Insufficient documentation

## 2015-12-10 DIAGNOSIS — R279 Unspecified lack of coordination: Secondary | ICD-10-CM | POA: Insufficient documentation

## 2015-12-10 NOTE — Therapy (Signed)
[place] MAIN [facility] SERVICES [address] [place], [place], [address] Phone: [phone]   Fax:  [phone]  Physical Therapy Evaluation  Patient Details  Name: Erin Campos MRN: [id number] Date of Birth: 1956/03/01 Referring Provider: Adonis Huguenin MD   Encounter Date: 12/10/2015      PT End of Session - 12/10/15 2202    Visit Number 1   Number of Visits 12   Date for PT Re-Evaluation 03/02/16   PT Start Time 0805   PT Stop Time 0900   PT Time Calculation (min) 55 min      Past Medical History  Diagnosis Date  . Kidney stone   . SVT (supraventricular tachycardia) ([place])   . Anxiety   . Arrhythmia   . Arthritis   . Facial basal cell cancer   . Spleen anomaly     compressed  . Controlled diabetes mellitus ([place])   . Heart attack ([place])   . High cholesterol   . HTN (hypertension)   . Sleep apnea     CPAP  . Recurrent UTI   . Bulging lumbar disc   . Cystocele   . Rectocele   . External hemorrhoids     Past Surgical History  Procedure Laterality Date  . Heart ablation    . Kidney stone surgery      stents  . Cosmetic surgery  2014    Lip  . Basal cell carcinoma excision      face  . Bladder tack  2004  . Vaginal hysterectomy    . Cystoscopy w/ retrogrades Bilateral 10/04/2015    Procedure: CYSTOSCOPY WITH RETROGRADE PYELOGRAM;  Surgeon: Hollice Espy, MD;  Location: [facility] ORS;  Service: Urology;  Laterality: Bilateral;    Filed Vitals:   12/10/15 0825  BP: 100/60    Visit Diagnosis:  Pelvic floor dysfunction - Plan: PT plan of care cert/re-cert  Lack of coordination - Plan: PT plan of care cert/re-cert  Pelvic girdle weakness - Plan: PT plan of care cert/re-cert  Poor posture - Plan: PT plan of care cert/re-cert      Subjective Assessment - 12/10/15 0825    Subjective 1) Pt reported she has had  a rectocele for 2 years and then experienced the partial rectal prolapse this year 11/16/15 after putting her  dishes into her cabinet. Pt describes prolapse sensations 25% of the time that include "everything going to fall out" or "placing of gauze after hysterectomy" . Discomfort , fullness, and dryness will ease with elevating of feett and laying on her back. Sensations increase with bending over (repeated bending as a homecare RN) , standing on her feet (5hrs/day at work),  sit-ups, walking 1 mile. Pt reports her sensations have improved from 8/10 to 5/10 after using vaginal creaming for atrophy and losing weight (100lbs).  Bowel movements: pt has to stand sometimes and splint to get it started. Denied SUI. PT feels she has to bear down to empty her bladder.   2) LBP at L/S junction, radiating around L lateral hip at greater trochanter level. 3/10 associated with intermittent soreness in low abdomen. Worse with bending         Patient is accompained by: Family member  husband   Pertinent History Lose 100 lbs, hysterectomy (vaginal), Hx of 2 vaginal delivery (tearing to rectum)    Patient Stated Goals 1)             [facility] PT Assessment - 12/10/15 0845  Assessment   Medical Diagnosis rectocele    Referring Provider Adonis Huguenin MD    Precautions   Precautions --  no lifting over 25 lbs, no straining    Restrictions   Weight Bearing Restrictions No   Balance Screen   Has the patient fallen in the past 6 months No   Observation/Other Assessments   Observations severe thoracic kyphosis   slumped sitting   Coordination   Gross Motor Movements are Fluid and Coordinated --  chest breathing   Fine Motor Movements are Fluid and Coordinated --  abdominal pelvic floor strain w/ bowel movement/ urinary cue   Other:   Other/ Comments simulated work task: mod A stand pivot assist with breathholding, poor body mechanics   Posture/Postural Control   Posture Comments lumbopelvic instability w/ ALSR , hip flexion   AROM   Overall AROM Comments p! at 30% spinal flexion     Palpation   Spinal mobility --    Bed Mobility   Bed Mobility --  crunch method                 Pelvic Floor Special Questions - 12/10/15 2157    Pelvic Floor Internal Exam to be assessed at next session          [facility] Adult PT Treatment/Exercise - 12/10/15 0845    Transfers   Comments breathholding  proper sitting posture   Self-Care   Self-Care --  anatomy and physiology, goals, POC   Neuro Re-ed    Neuro Re-ed Details  work body mechanics, ways to decrease downward forces onto pelvic floor                 PT Education - 12/10/15 2201    Education provided Yes   Education Details POC, anatomy, physiology, goals,    Person(s) Educated Patient;Spouse   Methods Explanation;Demonstration;Tactile cues;Verbal cues;Handout   Comprehension Returned demonstration;Verbalized understanding             PT Long Term Goals - 12/10/15 [id number]    PT LONG TERM GOAL #1   Title Pt will demo no lumbopelvic instability with deep core level 1-4 5 reps in order to have postural stability for bending and lifting at work   Time 12   Period Weeks   Status New   PT LONG TERM GOAL #2   Title Bowel movements: pt has to stand sometimes and splint to get it started. Denied SUI. PT feels she has to bear down to empty her bladder.    PT LONG TERM GOAL #3   Title Pt will be able to demo proper bending and patient handling techniques without cuing across 2 visit in order to perform work duties without worsening prolpase Sx.   Time 12   Period Weeks   Status New   PT LONG TERM GOAL #4   Title Pt will be IND with HEP in order to improve posture and minimize load on pelvic floor mm and minimize risks associated with osteoporosis.   Time 12   Period Weeks   Status New   PT LONG TERM GOAL #5   Title Pt will decrease ODI score from 30% to < 15% in order to participate in ADLs.   Time 12   Period Weeks   Status New   Additional Long Term Goals   Additional Long Term Goals Yes   PT LONG TERM GOAL #6   Title Pt  will decrease POPDI -6 from 54% to < 49% in  order to demo improve pelvic floor strength and function   Time 12   Period Weeks   Status New               Plan - 2015/12/31 15-Jan-2201    Clinical Impression Statement Pt is a 60 yo female whose complaints include discomfort and pressure sensations related to rectocele/ partial  rectal prolapse in addition to LBP. These deficits impact her ability to bend, stand at work, and play with her grandkids . Pt's clinical presentations  include severe thoracic kyphosis, weak and dyscoordinated deep core  system, and poor body mechanics that places strain on her pelvic floor mm.  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  At the end of today's session, pt reported decreased  discomfort with neuro-reeducation with proper sitting posture and demo'd  correct toileting technique without straining. Further assessment of pelvic floor will be performed at next session.     Pt will benefit from skilled therapeutic intervention in order to improve on the following deficits Abnormal gait;Pain;Impaired sensation;Improper body mechanics;Postural dysfunction;Increased muscle spasms;Decreased mobility;Decreased strength;Decreased endurance;Decreased range of motion;Hypomobility;Difficulty walking;Obesity;Impaired flexibility;Decreased safety awareness;Decreased coordination   Rehab Potential Good   PT Frequency 1x / week   PT Duration 12 weeks   PT Treatment/Interventions ADLs/Self Care Home Management;Aquatic Therapy;Biofeedback;Electrical Stimulation;Cryotherapy;Gait training;Moist Heat;Traction;Therapeutic activities;Functional mobility training;Stair training;Patient/family education;Scar mobilization;Passive range of motion;Therapeutic exercise;Balance training;Neuromuscular re-education;Manual techniques;Taping;Energy conservation   Consulted and  Agree with Plan of Care Patient          G-Codes - December 31, 2015 01/15/14    Functional Assessment Tool Used ODI 30%, POPDI 54%   Functional Limitation Mobility: Walking and moving around   Mobility: Walking and Moving Around Current Status [phone]) At least 40 percent but less than 60 percent impaired, limited or restricted   Mobility: Walking and Moving Around Goal Status [phone]) At least 20 percent but less than 40 percent impaired, limited or restricted       Problem List Patient Active Problem List   Diagnosis Date Noted  . Supraventricular tachycardia ([place]) 11/16/2015  . Incomplete rectal prolapse 11/16/2015  . Vulvar lesion 10/28/2015  . Dyspareunia in female 10/28/2015  . Adrenal nodule ([place]) 09/12/2015  . Bipolar I disorder ([place]) 08/26/2015  . Essential (primary) hypertension 08/26/2015  . Arthralgia of hip 08/26/2015  . H/O disease 08/26/2015  . Decreased potassium in the blood 08/26/2015  . Idiopathic peripheral neuropathy ([place]) 08/26/2015  . Rectocele 08/26/2015  . Cystocele 08/26/2015  . Vaginal atrophy 08/26/2015  . Recurrent UTI 08/24/2015  . Microscopic hematuria 08/24/2015  . Atrophic vaginitis 08/24/2015  . Degeneration of intervertebral disc of lumbar region 11/20/2014  . Combined fat and carbohydrate induced hyperlipemia 11/20/2014  . Type 2 diabetes mellitus ([place]) 02/13/2013  . Family history of thyroid disease 02/13/2013  . Hypomagnesemia 02/13/2013  . Degenerative arthritis of hip 02/06/2013    Jerl Mina  ,PT, DPT, E-RYT  December 31, 2015, 10:20 PM  [place] MAIN [facility] SERVICES [address] [place], [place], [address] Phone: [phone]   Fax:  [phone]  Name: Erin Campos MRN: [id number] Date of Birth: 30-Oct-1955

## 2015-12-10 NOTE — Patient Instructions (Addendum)
                                               Preserve the function of your pelvic floor, abdomen, and back.              Avoid decreased straining of abdominal/pelvic floor muscles with less              slouching,  holding your breath with lifting/bowel movements)                                                     FUNCTIONAL POSTURES        

## 2015-12-13 DIAGNOSIS — K623 Rectal prolapse: Secondary | ICD-10-CM | POA: Diagnosis not present

## 2015-12-18 DIAGNOSIS — G4733 Obstructive sleep apnea (adult) (pediatric): Secondary | ICD-10-CM | POA: Diagnosis not present

## 2015-12-22 ENCOUNTER — Encounter: Payer: PPO | Admitting: Physical Therapy

## 2015-12-28 ENCOUNTER — Ambulatory Visit (INDEPENDENT_AMBULATORY_CARE_PROVIDER_SITE_OTHER): Payer: PPO | Admitting: Obstetrics and Gynecology

## 2015-12-28 ENCOUNTER — Encounter: Payer: Self-pay | Admitting: Obstetrics and Gynecology

## 2015-12-28 VITALS — BP 143/88 | HR 71 | Ht 68.0 in | Wt 184.8 lb

## 2015-12-28 DIAGNOSIS — N898 Other specified noninflammatory disorders of vagina: Secondary | ICD-10-CM | POA: Diagnosis not present

## 2015-12-28 DIAGNOSIS — N816 Rectocele: Secondary | ICD-10-CM

## 2015-12-28 DIAGNOSIS — N9089 Other specified noninflammatory disorders of vulva and perineum: Secondary | ICD-10-CM

## 2015-12-28 DIAGNOSIS — K623 Rectal prolapse: Secondary | ICD-10-CM

## 2015-12-28 DIAGNOSIS — N904 Leukoplakia of vulva: Secondary | ICD-10-CM | POA: Diagnosis not present

## 2015-12-28 DIAGNOSIS — N952 Postmenopausal atrophic vaginitis: Secondary | ICD-10-CM

## 2015-12-28 NOTE — Progress Notes (Signed)
Chief complaint: 1. Rectocele 2. Rectal prolapse 3. Cystocele 4. Vulvar lesion 5 vaginal atrophy  Patient presents today for excision of vulvar lesion. She also is here for follow-up on vaginal atrophy, rectocele, rectal prolapse.  Patient was seen in general surgery; subsequently referred to colorectal surgery; diabetic Is pending for April 17. During her colorectal surgery follow-up, agent could not reproduce rectal prolapse in office; later that day until she did develop the prolapse and reduced it herself.  Patient is using Premarin cream intravaginally once a week because of severe atrophy. She had cut back from twice a week" because of estrogen side effects.   OBJECTIVE: BP 143/88 mmHg  Pulse 71  Ht 5\' 8"  (1.727 m)  Wt 184 lb 12.8 oz (83.825 kg)  BMI 28.11 kg/m2 PELVIC: External Genitalia: Normal; Multiple hemangiomas on labia majora bilaterally; 7 mm papule right gluteus, pedunculated BUS: Atrophic Vagina: Mild Atrophy present, mild; first degree cystocele; moderate rectocele; yellow colored firm inclusion cyst at the vaginal cuff, 7 mm Cervix: Surgically absent Uterus: Surgically absent Adnexa: Normal RV: Deferred Bladder: Nontender  PROCEDURE: Vulvar biopsy Right gluteus papule biopsy site is cleansed with Betadine. 2 cc of 1% lidocaine without epinephrine is injected for anesthesia. Scalpel is used to remove the 7 mm papule. Silver nitrate stick is used for hemostasis. Bleeding minimal. Procedure was well-tolerated.  PROCEDURE: Vaginal biopsy 7 mm firm nodular is cystic mass and vaginal cuff is biopsied with Tischler forceps. Minimal bleeding is encountered. Monsel's solution is applied for hemostasis. Procedure was well-tolerated. 2 cc of yellow drainage released with biopsy.  ASSESSMENT: 1. Mild vaginal atrophy, improving with estrogen cream 2. Vulvar lesion, excised  today 3. Vaginal cuff cyst, biopsied today 4. Moderate rectocele, improving with dietary modifications  PLAN: 1. Continue with estrogen cream once a week 2. Biopsies completed as noted 3. Follow up with colorectal surgery for barium enema studies 4. Results of biopsies will be given biopsy 5. Return in 4 weeks for follow-up  A total of 15 minutes were spent face-to-face with the patient during this encounter and over half of that time dealt with counseling and coordination of care.  Brayton Mars, MD  Note: This dictation was prepared with Dragon dictation along with smaller phrase technology. Any transcriptional errors that result from this process are unintentional.

## 2015-12-28 NOTE — Patient Instructions (Signed)
1. Vulvar biopsy is; vaginal biopsy is done 2. Return in 4 weeks for follow-up on rectocele, rectal prolapse, and vaginal atrophy  VULVAR BIOPSY POST-PROCEDURE INSTRUCTIONS  1. You may take Ibuprofen, Aleve or Tylenol for pain if needed.    2. You may have a small amount of spotting.  You should wear a mini pad for the next few days.  3. You may use some topical Neosporin ointment if you would like (over the counter is fine).  4. You need to call if you have redness around the biopsy site, if there is any unusual draining, if the bleeding is heavy, or if you are concerned.  5. Shower or bathe as normal  6. We will call you within one week with results or we will discuss the results at your follow-up appointment if needed.

## 2015-12-30 LAB — PATHOLOGY

## 2015-12-31 LAB — PATHOLOGY

## 2016-01-03 DIAGNOSIS — I10 Essential (primary) hypertension: Secondary | ICD-10-CM | POA: Diagnosis not present

## 2016-01-03 DIAGNOSIS — I471 Supraventricular tachycardia: Secondary | ICD-10-CM | POA: Diagnosis not present

## 2016-01-03 DIAGNOSIS — M1711 Unilateral primary osteoarthritis, right knee: Secondary | ICD-10-CM | POA: Diagnosis not present

## 2016-01-03 DIAGNOSIS — R000 Tachycardia, unspecified: Secondary | ICD-10-CM | POA: Diagnosis not present

## 2016-01-03 DIAGNOSIS — E669 Obesity, unspecified: Secondary | ICD-10-CM | POA: Diagnosis not present

## 2016-01-03 DIAGNOSIS — M1990 Unspecified osteoarthritis, unspecified site: Secondary | ICD-10-CM | POA: Diagnosis not present

## 2016-01-03 DIAGNOSIS — G4733 Obstructive sleep apnea (adult) (pediatric): Secondary | ICD-10-CM | POA: Diagnosis not present

## 2016-01-03 DIAGNOSIS — E119 Type 2 diabetes mellitus without complications: Secondary | ICD-10-CM | POA: Diagnosis not present

## 2016-01-03 DIAGNOSIS — R0602 Shortness of breath: Secondary | ICD-10-CM | POA: Diagnosis not present

## 2016-01-03 DIAGNOSIS — F419 Anxiety disorder, unspecified: Secondary | ICD-10-CM | POA: Diagnosis not present

## 2016-01-03 DIAGNOSIS — F413 Other mixed anxiety disorders: Secondary | ICD-10-CM | POA: Diagnosis not present

## 2016-01-06 DIAGNOSIS — G4733 Obstructive sleep apnea (adult) (pediatric): Secondary | ICD-10-CM | POA: Diagnosis not present

## 2016-01-07 ENCOUNTER — Encounter: Payer: PPO | Admitting: Physical Therapy

## 2016-01-14 ENCOUNTER — Ambulatory Visit: Payer: Self-pay | Admitting: Physical Therapy

## 2016-01-14 ENCOUNTER — Ambulatory Visit: Payer: PPO | Attending: General Surgery | Admitting: Physical Therapy

## 2016-01-14 DIAGNOSIS — R293 Abnormal posture: Secondary | ICD-10-CM | POA: Insufficient documentation

## 2016-01-14 DIAGNOSIS — R279 Unspecified lack of coordination: Secondary | ICD-10-CM

## 2016-01-14 DIAGNOSIS — R29898 Other symptoms and signs involving the musculoskeletal system: Secondary | ICD-10-CM | POA: Insufficient documentation

## 2016-01-14 DIAGNOSIS — M6289 Other specified disorders of muscle: Secondary | ICD-10-CM

## 2016-01-14 DIAGNOSIS — N8184 Pelvic muscle wasting: Secondary | ICD-10-CM | POA: Insufficient documentation

## 2016-01-14 NOTE — Therapy (Signed)
[place] MAIN [facility] SERVICES [address] [place], [place], [address] Phone: [phone]   Fax:  [phone]  Patient Details  Name: Erin Campos MRN: [id number] Date of Birth: 09/14/1956 Referring Provider:  No ref. provider found  Encounter Date: 01/14/2016  Discharge Summary    PT spoke with pt over the phone on 01/14/16 for missed appt.   Pt stated after her first visit with PT, she felt an improvement by 25% and she continues to perform the exercises given. At this time, pt has been putting PT hold because she wants to schedule tests with her surgeon to find out whether surgery would be an option for her prolapse related Sx. Pt has been busy caring for her husband who has suffered an ankle injury and therefor,  has postponed her medical appts.   PT explained to pt about the benefits of pelvic floor rehab prior/post-surgery or to prevent surgery. Pt voiced understanding and expressed she will continue to practice her HEP. PT also provided PT clinic information and caregiver resources [facility]) for pt to be aware about in helping with husband's orthopedic issues.    Based on this phone conversation, pt is being d/c at this time and  will require a new order for PT when pt is ready for PT.      Jerl Mina ,PT, DPT, E-RYT  01/14/2016, 8:59 AM  [place] MAIN [facility] SERVICES [address] [place], [place], [address] Phone: [phone]   Fax:  [phone]

## 2016-01-17 ENCOUNTER — Encounter: Payer: PPO | Admitting: Physical Therapy

## 2016-01-18 DIAGNOSIS — G4733 Obstructive sleep apnea (adult) (pediatric): Secondary | ICD-10-CM | POA: Diagnosis not present

## 2016-01-21 ENCOUNTER — Telehealth: Payer: Self-pay | Admitting: *Deleted

## 2016-01-21 NOTE — Telephone Encounter (Signed)
Patient called and stated that she was suppose to come and see Dr Tennis Must on 01/26/16 for a 4 wk f./u appt. Patient was suppose to discuss information from her F/u visit with her gastroenterologist during that visit. Patient wont be be abte to make that appt and had to R/S it to  02/15/16 @ 10:45. She wanted to make sure that day was ok with Dr. Tennis Must. Patient is requesting a call back. Call back number [phone]

## 2016-01-21 NOTE — Telephone Encounter (Signed)
Pt aware per vm ok to r/s until seen by gi.

## 2016-01-25 ENCOUNTER — Ambulatory Visit: Payer: PPO | Admitting: Obstetrics and Gynecology

## 2016-01-26 ENCOUNTER — Ambulatory Visit: Payer: PPO | Admitting: Physical Therapy

## 2016-02-02 ENCOUNTER — Encounter: Payer: PPO | Admitting: Physical Therapy

## 2016-02-09 ENCOUNTER — Encounter: Payer: PPO | Admitting: Physical Therapy

## 2016-02-15 ENCOUNTER — Ambulatory Visit: Payer: PPO | Admitting: Obstetrics and Gynecology

## 2016-02-16 ENCOUNTER — Encounter: Payer: PPO | Admitting: Physical Therapy

## 2016-02-17 DIAGNOSIS — G4733 Obstructive sleep apnea (adult) (pediatric): Secondary | ICD-10-CM | POA: Diagnosis not present

## 2016-03-01 ENCOUNTER — Ambulatory Visit: Payer: PPO | Admitting: Obstetrics and Gynecology

## 2016-03-06 DIAGNOSIS — L739 Follicular disorder, unspecified: Secondary | ICD-10-CM | POA: Diagnosis not present

## 2016-03-06 DIAGNOSIS — L918 Other hypertrophic disorders of the skin: Secondary | ICD-10-CM | POA: Diagnosis not present

## 2016-03-06 DIAGNOSIS — D1801 Hemangioma of skin and subcutaneous tissue: Secondary | ICD-10-CM | POA: Diagnosis not present

## 2016-03-13 DIAGNOSIS — M5136 Other intervertebral disc degeneration, lumbar region: Secondary | ICD-10-CM | POA: Diagnosis not present

## 2016-03-13 DIAGNOSIS — M47816 Spondylosis without myelopathy or radiculopathy, lumbar region: Secondary | ICD-10-CM | POA: Diagnosis not present

## 2016-03-19 DIAGNOSIS — G4733 Obstructive sleep apnea (adult) (pediatric): Secondary | ICD-10-CM | POA: Diagnosis not present

## 2016-04-11 ENCOUNTER — Ambulatory Visit: Payer: PPO | Admitting: Obstetrics and Gynecology

## 2016-04-18 DIAGNOSIS — G4733 Obstructive sleep apnea (adult) (pediatric): Secondary | ICD-10-CM | POA: Diagnosis not present

## 2016-05-19 DIAGNOSIS — G4733 Obstructive sleep apnea (adult) (pediatric): Secondary | ICD-10-CM | POA: Diagnosis not present

## 2016-06-06 DIAGNOSIS — G2581 Restless legs syndrome: Secondary | ICD-10-CM | POA: Diagnosis not present

## 2016-06-06 DIAGNOSIS — M5136 Other intervertebral disc degeneration, lumbar region: Secondary | ICD-10-CM | POA: Diagnosis not present

## 2016-06-06 DIAGNOSIS — E1121 Type 2 diabetes mellitus with diabetic nephropathy: Secondary | ICD-10-CM | POA: Diagnosis not present

## 2016-06-06 DIAGNOSIS — E782 Mixed hyperlipidemia: Secondary | ICD-10-CM | POA: Diagnosis not present

## 2016-06-06 DIAGNOSIS — G4733 Obstructive sleep apnea (adult) (pediatric): Secondary | ICD-10-CM | POA: Diagnosis not present

## 2016-06-06 DIAGNOSIS — I10 Essential (primary) hypertension: Secondary | ICD-10-CM | POA: Diagnosis not present

## 2016-06-06 DIAGNOSIS — Z0000 Encounter for general adult medical examination without abnormal findings: Secondary | ICD-10-CM | POA: Diagnosis not present

## 2016-06-06 DIAGNOSIS — F419 Anxiety disorder, unspecified: Secondary | ICD-10-CM | POA: Diagnosis not present

## 2016-06-07 DIAGNOSIS — Z0000 Encounter for general adult medical examination without abnormal findings: Secondary | ICD-10-CM | POA: Diagnosis not present

## 2016-06-07 DIAGNOSIS — R350 Frequency of micturition: Secondary | ICD-10-CM | POA: Diagnosis not present

## 2016-06-07 DIAGNOSIS — E782 Mixed hyperlipidemia: Secondary | ICD-10-CM | POA: Diagnosis not present

## 2016-06-07 DIAGNOSIS — E1121 Type 2 diabetes mellitus with diabetic nephropathy: Secondary | ICD-10-CM | POA: Diagnosis not present

## 2016-06-08 DIAGNOSIS — M47816 Spondylosis without myelopathy or radiculopathy, lumbar region: Secondary | ICD-10-CM | POA: Diagnosis not present

## 2016-06-08 DIAGNOSIS — M5136 Other intervertebral disc degeneration, lumbar region: Secondary | ICD-10-CM | POA: Diagnosis not present

## 2016-06-22 DIAGNOSIS — N816 Rectocele: Secondary | ICD-10-CM | POA: Diagnosis not present

## 2016-06-22 DIAGNOSIS — R151 Fecal smearing: Secondary | ICD-10-CM | POA: Diagnosis not present

## 2016-06-22 DIAGNOSIS — K623 Rectal prolapse: Secondary | ICD-10-CM | POA: Diagnosis not present

## 2016-06-27 ENCOUNTER — Other Ambulatory Visit: Payer: Self-pay | Admitting: Family Medicine

## 2016-06-27 DIAGNOSIS — E279 Disorder of adrenal gland, unspecified: Principal | ICD-10-CM

## 2016-06-27 DIAGNOSIS — E278 Other specified disorders of adrenal gland: Secondary | ICD-10-CM

## 2016-07-07 DIAGNOSIS — M436 Torticollis: Secondary | ICD-10-CM | POA: Diagnosis not present

## 2016-07-07 DIAGNOSIS — M542 Cervicalgia: Secondary | ICD-10-CM | POA: Diagnosis not present

## 2016-07-25 ENCOUNTER — Encounter: Payer: PPO | Admitting: Obstetrics and Gynecology

## 2016-08-01 ENCOUNTER — Ambulatory Visit (INDEPENDENT_AMBULATORY_CARE_PROVIDER_SITE_OTHER): Payer: PPO | Admitting: Obstetrics and Gynecology

## 2016-08-01 ENCOUNTER — Encounter: Payer: Self-pay | Admitting: Obstetrics and Gynecology

## 2016-08-01 VITALS — BP 128/78 | HR 80 | Ht 68.0 in | Wt 193.8 lb

## 2016-08-01 DIAGNOSIS — N898 Other specified noninflammatory disorders of vagina: Secondary | ICD-10-CM | POA: Diagnosis not present

## 2016-08-01 DIAGNOSIS — N816 Rectocele: Secondary | ICD-10-CM

## 2016-08-01 DIAGNOSIS — N952 Postmenopausal atrophic vaginitis: Secondary | ICD-10-CM | POA: Diagnosis not present

## 2016-08-01 NOTE — Patient Instructions (Signed)
1. Vaginal biopsy is performed today. 2. Recommend Premarin cream intravaginal 1/2 g twice a week 3. Return in 6 months for follow-up 4. Results from vaginal biopsy will be made available 5. Do not place anything in the vagina for 72 hours (no douching, no sex, no tampons)

## 2016-08-01 NOTE — Progress Notes (Signed)
Chief complaint: 1. Rectocele 2. Vaginal atrophy  Patient presents for follow-up from April 2017 appointment. She is using Premarin cream intravaginal once a week. She has been seen at [facility] for colorectal evaluation for possible rectal prolapse  Review of defecation study at [facility] demonstrates a small rectocele, no enterocele, and no rectal prolapse.  Patient reports normal bowel function with high fiber diet.  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 Fissure biopsy forcep is used to remove 5 mm powder burn implant of vaginal cuff. Monsel solution is applied for hemostasis. Specimens sent to pathology. Blood loss is minimal. Procedure was well-tolerated.   ASSESSMENT: 1. Vaginal atrophy, mild 2. Vaginal cuff lesion, 5 mm powder burn implant (biopsied) 3. Mild rectocele, asymptomatic  PLAN: 1. Vaginal cuff biopsy; biopsy results will be made available 2. Increased Premarin cream to 1/2 g twice weekly intravaginal 3. Maintain high fiber diet 4. Return in 6 months for follow-up  A total of 15 minutes were spent face-to-face with the patient during this encounter and over half of that time dealt with counseling and coordination of care.  Brayton Mars, MD  Note: This dictation was  prepared with Dragon dictation along with smaller phrase technology. Any transcriptional errors that result from this process are unintentional.

## 2016-08-01 NOTE — Addendum Note (Signed)
Addended by: Elouise Munroe on: 08/01/2016 10:19 AM   Modules accepted: Orders

## 2016-08-03 LAB — PATHOLOGY

## 2016-08-07 DIAGNOSIS — L739 Follicular disorder, unspecified: Secondary | ICD-10-CM | POA: Diagnosis not present

## 2016-08-07 DIAGNOSIS — D225 Melanocytic nevi of trunk: Secondary | ICD-10-CM | POA: Diagnosis not present

## 2016-08-07 DIAGNOSIS — D1801 Hemangioma of skin and subcutaneous tissue: Secondary | ICD-10-CM | POA: Diagnosis not present

## 2016-08-07 DIAGNOSIS — L814 Other melanin hyperpigmentation: Secondary | ICD-10-CM | POA: Diagnosis not present

## 2016-08-07 DIAGNOSIS — L821 Other seborrheic keratosis: Secondary | ICD-10-CM | POA: Diagnosis not present

## 2016-08-11 DIAGNOSIS — G4733 Obstructive sleep apnea (adult) (pediatric): Secondary | ICD-10-CM | POA: Insufficient documentation

## 2016-08-11 DIAGNOSIS — M950 Acquired deformity of nose: Secondary | ICD-10-CM | POA: Diagnosis not present

## 2016-08-24 ENCOUNTER — Ambulatory Visit
Admission: RE | Admit: 2016-08-24 | Discharge: 2016-08-24 | Disposition: A | Discharge status: Home / Self Care | Payer: PPO | Source: Ambulatory Visit | Attending: Family Medicine | Admitting: Family Medicine

## 2016-08-24 DIAGNOSIS — E278 Other specified disorders of adrenal gland: Secondary | ICD-10-CM

## 2016-08-24 DIAGNOSIS — E279 Disorder of adrenal gland, unspecified: Secondary | ICD-10-CM | POA: Insufficient documentation

## 2016-08-24 DIAGNOSIS — K8020 Calculus of gallbladder without cholecystitis without obstruction: Secondary | ICD-10-CM | POA: Insufficient documentation

## 2016-08-29 DIAGNOSIS — M47816 Spondylosis without myelopathy or radiculopathy, lumbar region: Secondary | ICD-10-CM | POA: Diagnosis not present

## 2016-08-29 DIAGNOSIS — Z6829 Body mass index (BMI) 29.0-29.9, adult: Secondary | ICD-10-CM | POA: Diagnosis not present

## 2016-09-19 DIAGNOSIS — M950 Acquired deformity of nose: Secondary | ICD-10-CM | POA: Diagnosis not present

## 2016-09-19 DIAGNOSIS — Z9889 Other specified postprocedural states: Secondary | ICD-10-CM | POA: Diagnosis not present

## 2016-09-19 DIAGNOSIS — J301 Allergic rhinitis due to pollen: Secondary | ICD-10-CM | POA: Diagnosis not present

## 2016-09-19 DIAGNOSIS — J3489 Other specified disorders of nose and nasal sinuses: Secondary | ICD-10-CM | POA: Diagnosis not present

## 2016-09-19 DIAGNOSIS — G4733 Obstructive sleep apnea (adult) (pediatric): Secondary | ICD-10-CM | POA: Diagnosis not present

## 2016-09-19 DIAGNOSIS — J342 Deviated nasal septum: Secondary | ICD-10-CM | POA: Diagnosis not present

## 2016-09-19 DIAGNOSIS — J343 Hypertrophy of nasal turbinates: Secondary | ICD-10-CM | POA: Diagnosis not present

## 2016-09-20 DIAGNOSIS — M1711 Unilateral primary osteoarthritis, right knee: Secondary | ICD-10-CM | POA: Diagnosis not present

## 2016-09-20 DIAGNOSIS — M7741 Metatarsalgia, right foot: Secondary | ICD-10-CM | POA: Diagnosis not present

## 2016-09-20 DIAGNOSIS — M25561 Pain in right knee: Secondary | ICD-10-CM | POA: Diagnosis not present

## 2016-10-06 ENCOUNTER — Ambulatory Visit: Payer: PPO | Admitting: Urology

## 2016-10-30 ENCOUNTER — Other Ambulatory Visit: Payer: Self-pay | Admitting: Family Medicine

## 2016-10-30 DIAGNOSIS — Z1231 Encounter for screening mammogram for malignant neoplasm of breast: Secondary | ICD-10-CM

## 2016-11-06 DIAGNOSIS — L814 Other melanin hyperpigmentation: Secondary | ICD-10-CM | POA: Diagnosis not present

## 2016-11-07 ENCOUNTER — Ambulatory Visit
Admission: RE | Admit: 2016-11-07 | Discharge: 2016-11-07 | Disposition: A | Discharge status: Home / Self Care | Payer: PPO | Source: Ambulatory Visit | Attending: Family Medicine | Admitting: Family Medicine

## 2016-11-07 DIAGNOSIS — Z1231 Encounter for screening mammogram for malignant neoplasm of breast: Secondary | ICD-10-CM | POA: Diagnosis not present

## 2016-11-23 DIAGNOSIS — M79645 Pain in left finger(s): Secondary | ICD-10-CM | POA: Diagnosis not present

## 2016-11-28 DIAGNOSIS — M47812 Spondylosis without myelopathy or radiculopathy, cervical region: Secondary | ICD-10-CM | POA: Diagnosis not present

## 2016-11-28 DIAGNOSIS — M47816 Spondylosis without myelopathy or radiculopathy, lumbar region: Secondary | ICD-10-CM | POA: Diagnosis not present

## 2016-12-06 DIAGNOSIS — F419 Anxiety disorder, unspecified: Secondary | ICD-10-CM | POA: Diagnosis not present

## 2016-12-06 DIAGNOSIS — G2581 Restless legs syndrome: Secondary | ICD-10-CM | POA: Diagnosis not present

## 2016-12-06 DIAGNOSIS — I10 Essential (primary) hypertension: Secondary | ICD-10-CM | POA: Diagnosis not present

## 2016-12-06 DIAGNOSIS — G4733 Obstructive sleep apnea (adult) (pediatric): Secondary | ICD-10-CM | POA: Diagnosis not present

## 2016-12-06 DIAGNOSIS — E782 Mixed hyperlipidemia: Secondary | ICD-10-CM | POA: Diagnosis not present

## 2016-12-06 DIAGNOSIS — E1121 Type 2 diabetes mellitus with diabetic nephropathy: Secondary | ICD-10-CM | POA: Diagnosis not present

## 2016-12-06 DIAGNOSIS — M5136 Other intervertebral disc degeneration, lumbar region: Secondary | ICD-10-CM | POA: Diagnosis not present

## 2016-12-10 DIAGNOSIS — J101 Influenza due to other identified influenza virus with other respiratory manifestations: Secondary | ICD-10-CM | POA: Diagnosis not present

## 2016-12-10 DIAGNOSIS — R509 Fever, unspecified: Secondary | ICD-10-CM | POA: Diagnosis not present

## 2016-12-12 DIAGNOSIS — R05 Cough: Secondary | ICD-10-CM | POA: Diagnosis not present

## 2016-12-12 DIAGNOSIS — R509 Fever, unspecified: Secondary | ICD-10-CM | POA: Diagnosis not present

## 2016-12-12 DIAGNOSIS — Z8709 Personal history of other diseases of the respiratory system: Secondary | ICD-10-CM | POA: Diagnosis not present

## 2016-12-18 DIAGNOSIS — M65342 Trigger finger, left ring finger: Secondary | ICD-10-CM | POA: Diagnosis not present

## 2016-12-19 DIAGNOSIS — F413 Other mixed anxiety disorders: Secondary | ICD-10-CM | POA: Diagnosis not present

## 2016-12-19 DIAGNOSIS — I471 Supraventricular tachycardia: Secondary | ICD-10-CM | POA: Diagnosis not present

## 2016-12-19 DIAGNOSIS — E669 Obesity, unspecified: Secondary | ICD-10-CM | POA: Diagnosis not present

## 2016-12-19 DIAGNOSIS — E119 Type 2 diabetes mellitus without complications: Secondary | ICD-10-CM | POA: Diagnosis not present

## 2016-12-19 DIAGNOSIS — R0602 Shortness of breath: Secondary | ICD-10-CM | POA: Diagnosis not present

## 2016-12-19 DIAGNOSIS — F419 Anxiety disorder, unspecified: Secondary | ICD-10-CM | POA: Diagnosis not present

## 2016-12-19 DIAGNOSIS — G4733 Obstructive sleep apnea (adult) (pediatric): Secondary | ICD-10-CM | POA: Diagnosis not present

## 2016-12-19 DIAGNOSIS — I10 Essential (primary) hypertension: Secondary | ICD-10-CM | POA: Diagnosis not present

## 2016-12-19 DIAGNOSIS — M1990 Unspecified osteoarthritis, unspecified site: Secondary | ICD-10-CM | POA: Diagnosis not present

## 2016-12-19 DIAGNOSIS — R000 Tachycardia, unspecified: Secondary | ICD-10-CM | POA: Diagnosis not present

## 2016-12-25 ENCOUNTER — Other Ambulatory Visit: Payer: Self-pay

## 2016-12-25 MED ORDER — ESTRADIOL 0.1 MG/GM VA CREA: 0.2500 | TOPICAL_CREAM | VAGINAL | 0 refills | Status: DC

## 2016-12-27 ENCOUNTER — Other Ambulatory Visit: Payer: Self-pay

## 2016-12-27 ENCOUNTER — Telehealth: Payer: Self-pay | Admitting: Obstetrics and Gynecology

## 2016-12-27 NOTE — Telephone Encounter (Signed)
Pt aware med phone in. Estradiol is compound.

## 2016-12-27 NOTE — Telephone Encounter (Signed)
Patient needs refill script sent to [facility] for estrace vaginal cream Please call [phone]

## 2016-12-27 NOTE — Telephone Encounter (Signed)
Pt aware med phoned in. P'ts estradiol is compounded. Must be phone in.

## 2017-01-17 DIAGNOSIS — G4733 Obstructive sleep apnea (adult) (pediatric): Secondary | ICD-10-CM | POA: Diagnosis not present

## 2017-01-18 DIAGNOSIS — J019 Acute sinusitis, unspecified: Secondary | ICD-10-CM | POA: Diagnosis not present

## 2017-01-18 DIAGNOSIS — B9689 Other specified bacterial agents as the cause of diseases classified elsewhere: Secondary | ICD-10-CM | POA: Diagnosis not present

## 2017-01-18 DIAGNOSIS — J301 Allergic rhinitis due to pollen: Secondary | ICD-10-CM | POA: Diagnosis not present

## 2017-01-23 DIAGNOSIS — M47816 Spondylosis without myelopathy or radiculopathy, lumbar region: Secondary | ICD-10-CM | POA: Diagnosis not present

## 2017-01-23 DIAGNOSIS — I10 Essential (primary) hypertension: Secondary | ICD-10-CM | POA: Diagnosis not present

## 2017-01-23 DIAGNOSIS — Z6831 Body mass index (BMI) 31.0-31.9, adult: Secondary | ICD-10-CM | POA: Diagnosis not present

## 2017-01-23 DIAGNOSIS — M47812 Spondylosis without myelopathy or radiculopathy, cervical region: Secondary | ICD-10-CM | POA: Diagnosis not present

## 2017-01-30 ENCOUNTER — Encounter: Payer: PPO | Admitting: Obstetrics and Gynecology

## 2017-02-14 ENCOUNTER — Ambulatory Visit (INDEPENDENT_AMBULATORY_CARE_PROVIDER_SITE_OTHER): Payer: PPO | Admitting: Obstetrics and Gynecology

## 2017-02-14 ENCOUNTER — Encounter: Payer: Self-pay | Admitting: Obstetrics and Gynecology

## 2017-02-14 VITALS — BP 117/75 | HR 80 | Ht 68.0 in | Wt 211.5 lb

## 2017-02-14 DIAGNOSIS — N952 Postmenopausal atrophic vaginitis: Secondary | ICD-10-CM | POA: Diagnosis not present

## 2017-02-14 DIAGNOSIS — N816 Rectocele: Secondary | ICD-10-CM | POA: Diagnosis not present

## 2017-02-14 NOTE — Progress Notes (Signed)
Chief complaint: 1. Rectocele 2. Vaginal atrophy  Patient is using Premarin cream intravaginal once or twice a week for vaginal atrophy and symptomatic rectocele. She is also using MiraLAX at this time and is having regular bowel function. She is pleased with overall management of the rectocele at this time.  Prior workup at [facility] did not demonstrate any rectal prolapse or enterocele.  Patient continues to eat a high fiber diet.  Past medical history, past surgical history, problem list, medications, and allergies are reviewed  OBJECTIVE: BP 117/75   Pulse 80   Ht 5\' 8"  (1.727 m)   Wt 211 lb 8 oz (95.9 kg)   BMI 32.16 kg/m  Pleasant female in no acute distress. Alert and oriented. 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 Vaginal atrophy, improved with Premarin cream therapy 2. Moderate rectocele; asymptomatic with vaginal estrogen cream and MiraLAX  PLAN: 1. Continue using Premarin cream intravaginal twice a week 2. Continue using MiraLAX 3. Continue with high-fiber diet 4. Return yearly for follow-up 5. Maintain regular exams with Dr. Ellison Hughs  A total of 15 minutes were spent face-to-face with the patient during this encounter and over half of that time dealt with counseling and coordination of care.  Brayton Mars, MD  Note: This dictation was prepared with Dragon dictation along with smaller phrase technology. Any transcriptional errors that result from this process are unintentional.

## 2017-02-14 NOTE — Patient Instructions (Signed)
1. Continue using Premarin cream intravaginal twice a week 2. Return in 1 year for follow-up on rectocele and vaginal atrophy 3. Continue seeing Dr. Ellison Hughs for routine physical

## 2017-02-16 DIAGNOSIS — G4733 Obstructive sleep apnea (adult) (pediatric): Secondary | ICD-10-CM | POA: Diagnosis not present

## 2017-03-07 DIAGNOSIS — N200 Calculus of kidney: Secondary | ICD-10-CM | POA: Insufficient documentation

## 2017-03-07 DIAGNOSIS — I10 Essential (primary) hypertension: Secondary | ICD-10-CM | POA: Diagnosis not present

## 2017-03-07 DIAGNOSIS — F329 Major depressive disorder, single episode, unspecified: Secondary | ICD-10-CM | POA: Diagnosis not present

## 2017-03-07 DIAGNOSIS — Z01818 Encounter for other preprocedural examination: Secondary | ICD-10-CM | POA: Diagnosis not present

## 2017-03-07 DIAGNOSIS — Z7982 Long term (current) use of aspirin: Secondary | ICD-10-CM | POA: Diagnosis not present

## 2017-03-07 DIAGNOSIS — R002 Palpitations: Secondary | ICD-10-CM | POA: Insufficient documentation

## 2017-03-07 DIAGNOSIS — Z79899 Other long term (current) drug therapy: Secondary | ICD-10-CM | POA: Diagnosis not present

## 2017-03-07 DIAGNOSIS — R0602 Shortness of breath: Secondary | ICD-10-CM | POA: Insufficient documentation

## 2017-03-07 DIAGNOSIS — J3489 Other specified disorders of nose and nasal sinuses: Secondary | ICD-10-CM | POA: Diagnosis not present

## 2017-03-15 DIAGNOSIS — F419 Anxiety disorder, unspecified: Secondary | ICD-10-CM | POA: Diagnosis not present

## 2017-03-15 DIAGNOSIS — G8929 Other chronic pain: Secondary | ICD-10-CM | POA: Diagnosis not present

## 2017-03-15 DIAGNOSIS — Z87891 Personal history of nicotine dependence: Secondary | ICD-10-CM | POA: Diagnosis not present

## 2017-03-15 DIAGNOSIS — G4733 Obstructive sleep apnea (adult) (pediatric): Secondary | ICD-10-CM | POA: Diagnosis not present

## 2017-03-15 DIAGNOSIS — M549 Dorsalgia, unspecified: Secondary | ICD-10-CM | POA: Diagnosis not present

## 2017-03-15 DIAGNOSIS — G4700 Insomnia, unspecified: Secondary | ICD-10-CM | POA: Diagnosis not present

## 2017-03-15 DIAGNOSIS — E119 Type 2 diabetes mellitus without complications: Secondary | ICD-10-CM | POA: Diagnosis not present

## 2017-03-15 DIAGNOSIS — Z885 Allergy status to narcotic agent status: Secondary | ICD-10-CM | POA: Diagnosis not present

## 2017-03-15 DIAGNOSIS — J342 Deviated nasal septum: Secondary | ICD-10-CM | POA: Diagnosis not present

## 2017-03-15 DIAGNOSIS — J343 Hypertrophy of nasal turbinates: Secondary | ICD-10-CM | POA: Diagnosis not present

## 2017-03-15 DIAGNOSIS — Z79899 Other long term (current) drug therapy: Secondary | ICD-10-CM | POA: Diagnosis not present

## 2017-03-15 DIAGNOSIS — F329 Major depressive disorder, single episode, unspecified: Secondary | ICD-10-CM | POA: Diagnosis not present

## 2017-03-15 DIAGNOSIS — E785 Hyperlipidemia, unspecified: Secondary | ICD-10-CM | POA: Diagnosis not present

## 2017-03-15 DIAGNOSIS — I10 Essential (primary) hypertension: Secondary | ICD-10-CM | POA: Diagnosis not present

## 2017-03-15 DIAGNOSIS — Z7982 Long term (current) use of aspirin: Secondary | ICD-10-CM | POA: Diagnosis not present

## 2017-03-15 DIAGNOSIS — Z8249 Family history of ischemic heart disease and other diseases of the circulatory system: Secondary | ICD-10-CM | POA: Diagnosis not present

## 2017-03-15 DIAGNOSIS — J3489 Other specified disorders of nose and nasal sinuses: Secondary | ICD-10-CM | POA: Diagnosis not present

## 2017-03-19 DIAGNOSIS — G4733 Obstructive sleep apnea (adult) (pediatric): Secondary | ICD-10-CM | POA: Diagnosis not present

## 2017-04-11 DIAGNOSIS — M65342 Trigger finger, left ring finger: Secondary | ICD-10-CM | POA: Diagnosis not present

## 2017-04-18 DIAGNOSIS — G4733 Obstructive sleep apnea (adult) (pediatric): Secondary | ICD-10-CM | POA: Diagnosis not present

## 2017-04-23 DIAGNOSIS — I10 Essential (primary) hypertension: Secondary | ICD-10-CM | POA: Diagnosis not present

## 2017-04-23 DIAGNOSIS — M47816 Spondylosis without myelopathy or radiculopathy, lumbar region: Secondary | ICD-10-CM | POA: Diagnosis not present

## 2017-04-23 DIAGNOSIS — Z6832 Body mass index (BMI) 32.0-32.9, adult: Secondary | ICD-10-CM | POA: Diagnosis not present

## 2017-04-23 DIAGNOSIS — M47812 Spondylosis without myelopathy or radiculopathy, cervical region: Secondary | ICD-10-CM | POA: Diagnosis not present

## 2017-05-14 DIAGNOSIS — M950 Acquired deformity of nose: Secondary | ICD-10-CM | POA: Diagnosis not present

## 2017-05-14 DIAGNOSIS — J3489 Other specified disorders of nose and nasal sinuses: Secondary | ICD-10-CM | POA: Diagnosis not present

## 2017-05-19 DIAGNOSIS — G4733 Obstructive sleep apnea (adult) (pediatric): Secondary | ICD-10-CM | POA: Diagnosis not present

## 2017-05-29 DIAGNOSIS — Z01818 Encounter for other preprocedural examination: Secondary | ICD-10-CM | POA: Diagnosis not present

## 2017-05-29 DIAGNOSIS — M1990 Unspecified osteoarthritis, unspecified site: Secondary | ICD-10-CM | POA: Diagnosis not present

## 2017-05-29 DIAGNOSIS — E669 Obesity, unspecified: Secondary | ICD-10-CM | POA: Diagnosis not present

## 2017-05-29 DIAGNOSIS — R000 Tachycardia, unspecified: Secondary | ICD-10-CM | POA: Diagnosis not present

## 2017-05-29 DIAGNOSIS — R0602 Shortness of breath: Secondary | ICD-10-CM | POA: Diagnosis not present

## 2017-05-29 DIAGNOSIS — F419 Anxiety disorder, unspecified: Secondary | ICD-10-CM | POA: Diagnosis not present

## 2017-05-29 DIAGNOSIS — E119 Type 2 diabetes mellitus without complications: Secondary | ICD-10-CM | POA: Diagnosis not present

## 2017-05-29 DIAGNOSIS — I10 Essential (primary) hypertension: Secondary | ICD-10-CM | POA: Diagnosis not present

## 2017-05-29 DIAGNOSIS — G4733 Obstructive sleep apnea (adult) (pediatric): Secondary | ICD-10-CM | POA: Diagnosis not present

## 2017-05-29 DIAGNOSIS — F413 Other mixed anxiety disorders: Secondary | ICD-10-CM | POA: Diagnosis not present

## 2017-05-29 DIAGNOSIS — I471 Supraventricular tachycardia: Secondary | ICD-10-CM | POA: Diagnosis not present

## 2017-06-11 DIAGNOSIS — F419 Anxiety disorder, unspecified: Secondary | ICD-10-CM | POA: Diagnosis not present

## 2017-06-11 DIAGNOSIS — Z0000 Encounter for general adult medical examination without abnormal findings: Secondary | ICD-10-CM | POA: Diagnosis not present

## 2017-06-11 DIAGNOSIS — I10 Essential (primary) hypertension: Secondary | ICD-10-CM | POA: Diagnosis not present

## 2017-06-11 DIAGNOSIS — G2581 Restless legs syndrome: Secondary | ICD-10-CM | POA: Diagnosis not present

## 2017-06-11 DIAGNOSIS — G4733 Obstructive sleep apnea (adult) (pediatric): Secondary | ICD-10-CM | POA: Diagnosis not present

## 2017-06-11 DIAGNOSIS — E1121 Type 2 diabetes mellitus with diabetic nephropathy: Secondary | ICD-10-CM | POA: Diagnosis not present

## 2017-06-11 DIAGNOSIS — M5136 Other intervertebral disc degeneration, lumbar region: Secondary | ICD-10-CM | POA: Diagnosis not present

## 2017-06-11 DIAGNOSIS — E782 Mixed hyperlipidemia: Secondary | ICD-10-CM | POA: Diagnosis not present

## 2017-06-11 DIAGNOSIS — Z13820 Encounter for screening for osteoporosis: Secondary | ICD-10-CM | POA: Diagnosis not present

## 2017-06-11 DIAGNOSIS — L910 Hypertrophic scar: Secondary | ICD-10-CM | POA: Diagnosis not present

## 2017-06-13 DIAGNOSIS — M1711 Unilateral primary osteoarthritis, right knee: Secondary | ICD-10-CM | POA: Diagnosis not present

## 2017-06-19 DIAGNOSIS — G4733 Obstructive sleep apnea (adult) (pediatric): Secondary | ICD-10-CM | POA: Diagnosis not present

## 2017-06-25 DIAGNOSIS — Z780 Asymptomatic menopausal state: Secondary | ICD-10-CM | POA: Diagnosis not present

## 2017-07-09 DIAGNOSIS — M5442 Lumbago with sciatica, left side: Secondary | ICD-10-CM | POA: Diagnosis not present

## 2017-07-19 DIAGNOSIS — M47816 Spondylosis without myelopathy or radiculopathy, lumbar region: Secondary | ICD-10-CM | POA: Diagnosis not present

## 2017-07-19 DIAGNOSIS — Z6831 Body mass index (BMI) 31.0-31.9, adult: Secondary | ICD-10-CM | POA: Diagnosis not present

## 2017-07-19 DIAGNOSIS — I10 Essential (primary) hypertension: Secondary | ICD-10-CM | POA: Diagnosis not present

## 2017-07-19 DIAGNOSIS — G4733 Obstructive sleep apnea (adult) (pediatric): Secondary | ICD-10-CM | POA: Diagnosis not present

## 2017-08-07 DIAGNOSIS — G4733 Obstructive sleep apnea (adult) (pediatric): Secondary | ICD-10-CM | POA: Diagnosis not present

## 2017-08-13 DIAGNOSIS — L910 Hypertrophic scar: Secondary | ICD-10-CM | POA: Diagnosis not present

## 2017-08-13 DIAGNOSIS — Z9889 Other specified postprocedural states: Secondary | ICD-10-CM | POA: Diagnosis not present

## 2017-08-13 DIAGNOSIS — J310 Chronic rhinitis: Secondary | ICD-10-CM | POA: Diagnosis not present

## 2017-08-13 DIAGNOSIS — M5416 Radiculopathy, lumbar region: Secondary | ICD-10-CM | POA: Diagnosis not present

## 2017-08-14 DIAGNOSIS — L738 Other specified follicular disorders: Secondary | ICD-10-CM | POA: Diagnosis not present

## 2017-08-14 DIAGNOSIS — D1801 Hemangioma of skin and subcutaneous tissue: Secondary | ICD-10-CM | POA: Diagnosis not present

## 2017-08-14 DIAGNOSIS — D225 Melanocytic nevi of trunk: Secondary | ICD-10-CM | POA: Diagnosis not present

## 2017-08-14 DIAGNOSIS — L821 Other seborrheic keratosis: Secondary | ICD-10-CM | POA: Diagnosis not present

## 2017-08-19 DIAGNOSIS — G4733 Obstructive sleep apnea (adult) (pediatric): Secondary | ICD-10-CM | POA: Diagnosis not present

## 2017-08-22 DIAGNOSIS — H524 Presbyopia: Secondary | ICD-10-CM | POA: Diagnosis not present

## 2017-09-13 DIAGNOSIS — I10 Essential (primary) hypertension: Secondary | ICD-10-CM | POA: Diagnosis not present

## 2017-09-13 DIAGNOSIS — Z6832 Body mass index (BMI) 32.0-32.9, adult: Secondary | ICD-10-CM | POA: Diagnosis not present

## 2017-09-13 DIAGNOSIS — M5416 Radiculopathy, lumbar region: Secondary | ICD-10-CM | POA: Diagnosis not present

## 2017-09-13 DIAGNOSIS — M47816 Spondylosis without myelopathy or radiculopathy, lumbar region: Secondary | ICD-10-CM | POA: Diagnosis not present

## 2017-09-18 DIAGNOSIS — G4733 Obstructive sleep apnea (adult) (pediatric): Secondary | ICD-10-CM | POA: Diagnosis not present

## 2017-09-20 DIAGNOSIS — M950 Acquired deformity of nose: Secondary | ICD-10-CM | POA: Diagnosis not present

## 2017-10-05 DIAGNOSIS — G4733 Obstructive sleep apnea (adult) (pediatric): Secondary | ICD-10-CM | POA: Diagnosis not present

## 2017-10-18 ENCOUNTER — Other Ambulatory Visit: Payer: Self-pay | Admitting: Family Medicine

## 2017-10-18 DIAGNOSIS — M5416 Radiculopathy, lumbar region: Secondary | ICD-10-CM | POA: Diagnosis not present

## 2017-10-18 DIAGNOSIS — Z1231 Encounter for screening mammogram for malignant neoplasm of breast: Secondary | ICD-10-CM

## 2017-10-19 DIAGNOSIS — G4733 Obstructive sleep apnea (adult) (pediatric): Secondary | ICD-10-CM | POA: Diagnosis not present

## 2017-10-24 DIAGNOSIS — M1711 Unilateral primary osteoarthritis, right knee: Secondary | ICD-10-CM | POA: Diagnosis not present

## 2017-11-09 DIAGNOSIS — G4733 Obstructive sleep apnea (adult) (pediatric): Secondary | ICD-10-CM | POA: Diagnosis not present

## 2017-11-14 ENCOUNTER — Ambulatory Visit: Payer: PPO

## 2017-11-20 DIAGNOSIS — M47816 Spondylosis without myelopathy or radiculopathy, lumbar region: Secondary | ICD-10-CM | POA: Diagnosis not present

## 2017-11-20 DIAGNOSIS — Z6832 Body mass index (BMI) 32.0-32.9, adult: Secondary | ICD-10-CM | POA: Diagnosis not present

## 2017-11-20 DIAGNOSIS — M5416 Radiculopathy, lumbar region: Secondary | ICD-10-CM | POA: Diagnosis not present

## 2017-11-21 ENCOUNTER — Ambulatory Visit
Admission: RE | Admit: 2017-11-21 | Discharge: 2017-11-21 | Disposition: A | Discharge status: Home / Self Care | Payer: PPO | Source: Ambulatory Visit | Attending: Family Medicine | Admitting: Family Medicine

## 2017-11-21 DIAGNOSIS — Z1231 Encounter for screening mammogram for malignant neoplasm of breast: Secondary | ICD-10-CM | POA: Insufficient documentation

## 2017-11-21 DIAGNOSIS — R6889 Other general symptoms and signs: Secondary | ICD-10-CM | POA: Diagnosis not present

## 2017-11-21 DIAGNOSIS — B001 Herpesviral vesicular dermatitis: Secondary | ICD-10-CM | POA: Diagnosis not present

## 2017-11-27 DIAGNOSIS — G4733 Obstructive sleep apnea (adult) (pediatric): Secondary | ICD-10-CM | POA: Diagnosis not present

## 2017-12-10 DIAGNOSIS — I10 Essential (primary) hypertension: Secondary | ICD-10-CM | POA: Diagnosis not present

## 2017-12-10 DIAGNOSIS — E1121 Type 2 diabetes mellitus with diabetic nephropathy: Secondary | ICD-10-CM | POA: Diagnosis not present

## 2017-12-10 DIAGNOSIS — G4733 Obstructive sleep apnea (adult) (pediatric): Secondary | ICD-10-CM | POA: Diagnosis not present

## 2017-12-10 DIAGNOSIS — G2581 Restless legs syndrome: Secondary | ICD-10-CM | POA: Diagnosis not present

## 2017-12-10 DIAGNOSIS — E782 Mixed hyperlipidemia: Secondary | ICD-10-CM | POA: Diagnosis not present

## 2017-12-10 DIAGNOSIS — M5136 Other intervertebral disc degeneration, lumbar region: Secondary | ICD-10-CM | POA: Diagnosis not present

## 2017-12-10 DIAGNOSIS — F419 Anxiety disorder, unspecified: Secondary | ICD-10-CM | POA: Diagnosis not present

## 2017-12-18 DIAGNOSIS — R000 Tachycardia, unspecified: Secondary | ICD-10-CM | POA: Diagnosis not present

## 2017-12-18 DIAGNOSIS — M1990 Unspecified osteoarthritis, unspecified site: Secondary | ICD-10-CM | POA: Diagnosis not present

## 2017-12-18 DIAGNOSIS — G4733 Obstructive sleep apnea (adult) (pediatric): Secondary | ICD-10-CM | POA: Diagnosis not present

## 2017-12-18 DIAGNOSIS — F413 Other mixed anxiety disorders: Secondary | ICD-10-CM | POA: Diagnosis not present

## 2017-12-18 DIAGNOSIS — R0602 Shortness of breath: Secondary | ICD-10-CM | POA: Diagnosis not present

## 2017-12-18 DIAGNOSIS — E119 Type 2 diabetes mellitus without complications: Secondary | ICD-10-CM | POA: Diagnosis not present

## 2017-12-18 DIAGNOSIS — I10 Essential (primary) hypertension: Secondary | ICD-10-CM | POA: Diagnosis not present

## 2017-12-18 DIAGNOSIS — I471 Supraventricular tachycardia: Secondary | ICD-10-CM | POA: Diagnosis not present

## 2017-12-18 DIAGNOSIS — E669 Obesity, unspecified: Secondary | ICD-10-CM | POA: Diagnosis not present

## 2018-01-17 DIAGNOSIS — M5416 Radiculopathy, lumbar region: Secondary | ICD-10-CM | POA: Diagnosis not present

## 2018-01-23 DIAGNOSIS — T63301A Toxic effect of unspecified spider venom, accidental (unintentional), initial encounter: Secondary | ICD-10-CM | POA: Diagnosis not present

## 2018-01-23 DIAGNOSIS — S80862A Insect bite (nonvenomous), left lower leg, initial encounter: Secondary | ICD-10-CM | POA: Diagnosis not present

## 2018-02-12 DIAGNOSIS — M1711 Unilateral primary osteoarthritis, right knee: Secondary | ICD-10-CM | POA: Diagnosis not present

## 2018-02-19 ENCOUNTER — Encounter: Payer: PPO | Admitting: Obstetrics and Gynecology

## 2018-02-27 DIAGNOSIS — M5416 Radiculopathy, lumbar region: Secondary | ICD-10-CM | POA: Diagnosis not present

## 2018-02-27 DIAGNOSIS — M47816 Spondylosis without myelopathy or radiculopathy, lumbar region: Secondary | ICD-10-CM | POA: Diagnosis not present

## 2018-02-27 DIAGNOSIS — M47812 Spondylosis without myelopathy or radiculopathy, cervical region: Secondary | ICD-10-CM | POA: Diagnosis not present

## 2018-03-20 ENCOUNTER — Encounter: Payer: Self-pay | Admitting: Obstetrics and Gynecology

## 2018-03-20 ENCOUNTER — Ambulatory Visit: Payer: PPO | Admitting: Obstetrics and Gynecology

## 2018-03-20 VITALS — BP 138/81 | HR 73 | Ht 68.0 in | Wt 217.1 lb

## 2018-03-20 DIAGNOSIS — N8111 Cystocele, midline: Secondary | ICD-10-CM | POA: Diagnosis not present

## 2018-03-20 DIAGNOSIS — N952 Postmenopausal atrophic vaginitis: Secondary | ICD-10-CM

## 2018-03-20 DIAGNOSIS — N816 Rectocele: Secondary | ICD-10-CM | POA: Diagnosis not present

## 2018-03-20 DIAGNOSIS — R300 Dysuria: Secondary | ICD-10-CM

## 2018-03-20 DIAGNOSIS — K623 Rectal prolapse: Secondary | ICD-10-CM | POA: Diagnosis not present

## 2018-03-20 LAB — POCT URINALYSIS DIPSTICK
BILIRUBIN UA: NEGATIVE
GLUCOSE UA: NEGATIVE
Ketones, UA: NEGATIVE
Leukocytes, UA: NEGATIVE
Nitrite, UA: NEGATIVE
ODOR: NEGATIVE
PH UA: 8 (ref 5.0–8.0)
Protein, UA: NEGATIVE
Spec Grav, UA: <=1.005 — AB (ref 1.010–1.025)
UROBILINOGEN UA: 0.2 U/dL

## 2018-03-20 MED ORDER — ESTRADIOL 0.1 MG/GM VA CREA: 0.2500 | TOPICAL_CREAM | VAGINAL | 12 refills | Status: DC

## 2018-03-20 MED ORDER — ESTRADIOL 2 MG VA RING: 2.0000 mg | VAGINAL_RING | VAGINAL | 12 refills | Status: DC

## 2018-03-20 NOTE — Addendum Note (Signed)
Addended by: Elouise Munroe on: 03/20/2018 11:07 AM   Modules accepted: Orders

## 2018-03-20 NOTE — Progress Notes (Signed)
Chief complaint: 1.  Moderate rectocele 2.  Vaginal atrophy 3.  Cystocele, first-degree  Erin Campos presents today in the company of her husband for follow-up on symptomatic rectocele.  She has been managing this with FiberCon, Colace twice a day, and periodic MiraLAX.  Stools are formed and soft and typically occur every day to every other day.  Occasionally she will have a bout of constipation that requires more aggressive laxative use.  Overall she is pleased with medical management of the rectocele. Cystocele has not really been a problem.  She is not leaking urine.  She has occasional urgency.  Most recently during a trip in the past week she noted some increased frequency /urgency of urination. Erin Campos is using Premarin cream intravaginal once a week for vaginal atrophy and has noted some improvement.  She feels that use of the cream is also associated with improved moods.  Past medical history, past surgical history, problem list, medications, and allergies are reviewed  OBJECTIVE: BP 138/81   Pulse 73   Ht 5\' 8"  (1.727 m)   Wt 217 lb 1.6 oz (98.5 kg)   BMI 33.01 kg/m  Pleasant well-appearing female no acute distress.  Alert and oriented. Back: No CVA tenderness Abdomen: Soft, nontender without organomegaly Pelvic exam: External genitalia-normal BUS-normal Vagina-moderate atrophy; first-degree cystocele; moderate rectocele; vagina with appropriate depth of approximately 6 cm length. Cervix-surgically absent Uterus-surgically absent Bimanual-no palpable masses or tenderness Rectovaginal-normal external exam  ASSESSMENT: 1.  Moderate rectocele, reasonably controlled with medical management 2.  Vaginal atrophy, moderate, improving with Premarin cream 3.  Urinary frequency and urgency recently, cannot rule out UTI 4.  Status post hysterectomy  PLAN: 1.  Discontinue Premarin cream 2.  Trial of Estring intravaginal every 3 months 3.  Continue with MiraLAX therapy, Colace  therapy, and FiberCon therapy 4.  Return in 3 months for follow-up  A total of 15 minutes were spent face-to-face with the patient during this encounter and over half of that time dealt with counseling and coordination of care.  Brayton Mars, MD  Note: This dictation was prepared with Dragon dictation along with smaller phrase technology. Any transcriptional errors that result from this process are unintentional.

## 2018-03-20 NOTE — Patient Instructions (Signed)
1.  Discontinue Premarin cream intravaginal 2.  Begin Estring 2 mg intravaginal every 3 months 3.  Continue with MiraLAX, Colace, and FiberCon as written 4.  Return in 3 months for follow-up  Estradiol vaginal ring (Estring) What is this medicine? ESTRADIOL (es tra DYE ole) vaginal ring is an insert that contains a female hormone. This medicine helps relieve symptoms of vaginal irritation and dryness that occurs in some women during menopause. This medicine may be used for other purposes; ask your health care provider or pharmacist if you have questions. COMMON BRAND NAME(S): Estring What should I tell my health care provider before I take this medicine? They need to know if you have any of these conditions: -abnormal vaginal bleeding -blood vessel disease or blood clots -breast, cervical, endometrial, ovarian, liver, or uterine cancer -dementia -diabetes -gallbladder disease -heart disease or recent heart attack -high blood pressure -high cholesterol -high level of calcium in the blood -hysterectomy -kidney disease -liver disease -migraine headaches -protein C deficiency -protein S deficiency -stroke -systemic lupus erythematosus (SLE) -tobacco smoker -an unusual or allergic reaction to estrogens, other hormones, medicines, foods, dyes, or preservatives -pregnant or trying to get pregnant -breast-feeding How should I use this medicine? This medicine may be inserted by you or your physician. Follow the directions that are included with your prescription. If you are unsure how to insert the ring, contact your doctor or health care professional. The vaginal ring should remain in place for 90 days. After 90 days you should replace your old ring and insert a new one. Do not stop using except on the advice of your doctor or health care professional. Contact your pediatrician regarding the use of this medicine in children. Special care may be needed. A patient package insert for the  product will be given with each prescription and refill. Read this sheet carefully each time. The sheet may change frequently. Overdosage: If you think you have taken too much of this medicine contact a poison control center or emergency room at once. NOTE: This medicine is only for you. Do not share this medicine with others. What if I miss a dose? If you miss a dose, use it as soon as you can. If it is almost time for your next dose, use only that dose. Do not use double or extra doses. What may interact with this medicine? Do not take this medicine with any of the following medications: -aromatase inhibitors like aminoglutethimide, anastrozole, exemestane, letrozole, testolactone, vorozole This medicine may also interact with the following medications: -carbamazepine -certain antibiotics used to treat infections -certain barbiturates used for inducing sleep or treating seizures -grapefruit juice -medicines for fungus infections like itraconazole and ketoconazole -raloxifene or tamoxifen -rifabutin, rifampin, or rifapentine -ritonavir -St. John's Wort This list may not describe all possible interactions. Give your health care provider a list of all the medicines, herbs, non-prescription drugs, or dietary supplements you use. Also tell them if you smoke, drink alcohol, or use illegal drugs. Some items may interact with your medicine. What should I watch for while using this medicine? Visit your doctor or health care professional for regular checks on your progress. You will need a regular breast and pelvic exam and Pap smear while on this medicine. You should also discuss the need for regular mammograms with your health care professional, and follow his or her guidelines for these tests. This medicine can make your body retain fluid, making your fingers, hands, or ankles swell. Your blood pressure can go up. Contact  your doctor or health care professional if you feel you are retaining  fluid. If you have any reason to think you are pregnant, stop taking this medicine right away and contact your doctor or health care professional. Smoking increases the risk of getting a blood clot or having a stroke while you are taking this medicine, especially if you are more than 61 years old. You are strongly advised not to smoke. If you wear contact lenses and notice visual changes, or if the lenses begin to feel uncomfortable, consult your eye doctor or health care professional. This medicine can increase the risk of developing a condition (endometrial hyperplasia) that may lead to cancer of the lining of the uterus. Taking progestins, another hormone drug, with this medicine lowers the risk of developing this condition. Therefore, if your uterus has not been removed (by a hysterectomy), your doctor may prescribe a progestin for you to take together with your estrogen. You should know, however, that taking estrogens with progestins may have additional health risks. You should discuss the use of estrogens and progestins with your health care professional to determine the benefits and risks for you. If you are going to have surgery, you may need to stop taking this medicine. Consult your health care professional for advice before you schedule the surgery. You may bathe or participate in other activities while using this medicine. You do not need to remove the vaginal ring during sexual or other activities unless you are more comfortable doing so. Within the 90-day dosage period, you may remove the vaginal ring, rinse it with clean lukewarm (not hot or boiling) water, and re-insert the ring as needed. What side effects may I notice from receiving this medicine? Side effects that you should report to your doctor or health care professional as soon as possible: -allergic reactions like skin rash, itching or hives, swelling of the face, lips, or tongue -breast tissue changes or discharge -signs and  symptoms of a blood clot such as breathing problems; changes in vision; chest pain; severe, sudden headache; pain, swelling, warmth in the leg; trouble speaking; sudden numbness or weakness of the face, arm or leg -signs and symptoms of infection like fever or chills; vomiting; diarrhea; muscle pain; dizziness; or a red, sunburn-like rash on face and body -signs and symptoms of liver injury like dark yellow or brown urine; general ill feeling or flu-like symptoms; light-colored stools; loss of appetite; nausea; right upper belly pain; unusually weak or tired; yellowing of the eyes or skin -symptoms of bowel blockage like constipation, abdominal swelling, abdominal pain, inability to pass gas or have a bowel movement -symptoms of vaginal infection like itching, irritation or unusual discharge -unusual or increased vaginal bleeding -vaginal pain or soreness, redness, swelling Side effects that usually do not require medical attention (report to your doctor or health care professional if they continue or are bothersome): -breast tenderness -fluid retention -hair loss -headache -nausea -upset stomach -vaginal spotting This list may not describe all possible side effects. Call your doctor for medical advice about side effects. You may report side effects to FDA at 1-800-FDA-1088. Where should I keep my medicine? Keep out of the reach of children. Store at room temperature between 15 and 25 degrees C (59 and 77 degrees F). Throw away any unused medicine after the expiration date. NOTE: This sheet is a summary. It may not cover all possible information. If you have questions about this medicine, talk to your doctor, pharmacist, or health care provider.  2018  Elsevier/Gold Standard (2014-07-06 13:20:25)

## 2018-03-21 ENCOUNTER — Telehealth: Payer: Self-pay | Admitting: Obstetrics and Gynecology

## 2018-03-21 NOTE — Telephone Encounter (Signed)
The patient called and stated that she would like to speak with her nurse in regards to her medications prescribed yesterday. Please advise.

## 2018-03-21 NOTE — Telephone Encounter (Signed)
Pt has decided to not use estring for now. She prefers to use estrace cream. [facility] aware she may have compound estradiol cream. Pt to f/u in 3 months.

## 2018-03-22 LAB — URINE CULTURE

## 2018-03-25 DIAGNOSIS — J310 Chronic rhinitis: Secondary | ICD-10-CM | POA: Diagnosis not present

## 2018-03-25 DIAGNOSIS — S0993XS Unspecified injury of face, sequela: Secondary | ICD-10-CM | POA: Diagnosis not present

## 2018-03-25 DIAGNOSIS — G4733 Obstructive sleep apnea (adult) (pediatric): Secondary | ICD-10-CM | POA: Diagnosis not present

## 2018-03-26 DIAGNOSIS — R21 Rash and other nonspecific skin eruption: Secondary | ICD-10-CM | POA: Diagnosis not present

## 2018-04-08 DIAGNOSIS — L819 Disorder of pigmentation, unspecified: Secondary | ICD-10-CM | POA: Diagnosis not present

## 2018-04-08 DIAGNOSIS — L814 Other melanin hyperpigmentation: Secondary | ICD-10-CM | POA: Diagnosis not present

## 2018-04-08 DIAGNOSIS — D229 Melanocytic nevi, unspecified: Secondary | ICD-10-CM | POA: Diagnosis not present

## 2018-04-08 DIAGNOSIS — Z85828 Personal history of other malignant neoplasm of skin: Secondary | ICD-10-CM | POA: Diagnosis not present

## 2018-04-08 DIAGNOSIS — L821 Other seborrheic keratosis: Secondary | ICD-10-CM | POA: Diagnosis not present

## 2018-04-09 ENCOUNTER — Other Ambulatory Visit: Payer: Self-pay | Admitting: Neurosurgery

## 2018-04-09 DIAGNOSIS — M5442 Lumbago with sciatica, left side: Secondary | ICD-10-CM | POA: Diagnosis not present

## 2018-04-09 DIAGNOSIS — G8929 Other chronic pain: Secondary | ICD-10-CM | POA: Diagnosis not present

## 2018-04-09 DIAGNOSIS — I10 Essential (primary) hypertension: Secondary | ICD-10-CM | POA: Diagnosis not present

## 2018-04-09 DIAGNOSIS — Z6833 Body mass index (BMI) 33.0-33.9, adult: Secondary | ICD-10-CM | POA: Diagnosis not present

## 2018-04-22 DIAGNOSIS — M4316 Spondylolisthesis, lumbar region: Secondary | ICD-10-CM | POA: Diagnosis not present

## 2018-04-22 DIAGNOSIS — M5416 Radiculopathy, lumbar region: Secondary | ICD-10-CM | POA: Diagnosis not present

## 2018-04-25 DIAGNOSIS — G4733 Obstructive sleep apnea (adult) (pediatric): Secondary | ICD-10-CM | POA: Diagnosis not present

## 2018-04-30 ENCOUNTER — Ambulatory Visit
Admission: RE | Admit: 2018-04-30 | Discharge: 2018-04-30 | Disposition: A | Discharge status: Home / Self Care | Payer: PPO | Source: Ambulatory Visit | Attending: Neurosurgery | Admitting: Neurosurgery

## 2018-04-30 DIAGNOSIS — M4696 Unspecified inflammatory spondylopathy, lumbar region: Secondary | ICD-10-CM | POA: Insufficient documentation

## 2018-04-30 DIAGNOSIS — G8929 Other chronic pain: Secondary | ICD-10-CM

## 2018-04-30 DIAGNOSIS — M5442 Lumbago with sciatica, left side: Secondary | ICD-10-CM

## 2018-04-30 DIAGNOSIS — M5126 Other intervertebral disc displacement, lumbar region: Secondary | ICD-10-CM | POA: Insufficient documentation

## 2018-04-30 DIAGNOSIS — M48061 Spinal stenosis, lumbar region without neurogenic claudication: Secondary | ICD-10-CM | POA: Diagnosis not present

## 2018-04-30 DIAGNOSIS — M545 Low back pain: Secondary | ICD-10-CM | POA: Diagnosis not present

## 2018-05-10 DIAGNOSIS — M4316 Spondylolisthesis, lumbar region: Secondary | ICD-10-CM | POA: Diagnosis not present

## 2018-05-10 DIAGNOSIS — I10 Essential (primary) hypertension: Secondary | ICD-10-CM | POA: Diagnosis not present

## 2018-05-10 DIAGNOSIS — M48062 Spinal stenosis, lumbar region with neurogenic claudication: Secondary | ICD-10-CM | POA: Diagnosis not present

## 2018-05-10 DIAGNOSIS — Z6832 Body mass index (BMI) 32.0-32.9, adult: Secondary | ICD-10-CM | POA: Diagnosis not present

## 2018-05-13 ENCOUNTER — Other Ambulatory Visit: Payer: Self-pay | Admitting: Neurosurgery

## 2018-05-13 DIAGNOSIS — L578 Other skin changes due to chronic exposure to nonionizing radiation: Secondary | ICD-10-CM | POA: Diagnosis not present

## 2018-05-22 DIAGNOSIS — M48062 Spinal stenosis, lumbar region with neurogenic claudication: Secondary | ICD-10-CM | POA: Diagnosis not present

## 2018-05-22 DIAGNOSIS — I10 Essential (primary) hypertension: Secondary | ICD-10-CM | POA: Diagnosis not present

## 2018-05-22 DIAGNOSIS — M5416 Radiculopathy, lumbar region: Secondary | ICD-10-CM | POA: Diagnosis not present

## 2018-05-22 DIAGNOSIS — M4316 Spondylolisthesis, lumbar region: Secondary | ICD-10-CM | POA: Diagnosis not present

## 2018-05-29 ENCOUNTER — Other Ambulatory Visit: Payer: Self-pay | Admitting: Neurosurgery

## 2018-06-12 DIAGNOSIS — M1711 Unilateral primary osteoarthritis, right knee: Secondary | ICD-10-CM | POA: Diagnosis not present

## 2018-06-13 DIAGNOSIS — I471 Supraventricular tachycardia: Secondary | ICD-10-CM | POA: Diagnosis not present

## 2018-06-13 DIAGNOSIS — I10 Essential (primary) hypertension: Secondary | ICD-10-CM | POA: Diagnosis not present

## 2018-06-13 DIAGNOSIS — E669 Obesity, unspecified: Secondary | ICD-10-CM | POA: Diagnosis not present

## 2018-06-13 DIAGNOSIS — F413 Other mixed anxiety disorders: Secondary | ICD-10-CM | POA: Diagnosis not present

## 2018-06-13 DIAGNOSIS — G4733 Obstructive sleep apnea (adult) (pediatric): Secondary | ICD-10-CM | POA: Diagnosis not present

## 2018-06-13 DIAGNOSIS — M1990 Unspecified osteoarthritis, unspecified site: Secondary | ICD-10-CM | POA: Diagnosis not present

## 2018-06-13 DIAGNOSIS — E119 Type 2 diabetes mellitus without complications: Secondary | ICD-10-CM | POA: Diagnosis not present

## 2018-06-13 DIAGNOSIS — R0602 Shortness of breath: Secondary | ICD-10-CM | POA: Diagnosis not present

## 2018-06-13 DIAGNOSIS — F419 Anxiety disorder, unspecified: Secondary | ICD-10-CM | POA: Diagnosis not present

## 2018-06-13 DIAGNOSIS — R000 Tachycardia, unspecified: Secondary | ICD-10-CM | POA: Diagnosis not present

## 2018-06-17 NOTE — Pre-Procedure Instructions (Addendum)
KELLEN HOVER  06/17/2018      [facility] [place] - [address] [address] Phone: [phone] Fax: [phone]  [id number] - [place], [place] [place] [address] Phone: [phone] Fax: [phone]    Your procedure is scheduled on Oct. 3  Report to [facility] at 5:30 A.M.  Call this number if you have problems the morning of surgery:  [phone]   Remember:  Do not eat or drink after midnight.      Take these medicines the morning of surgery with A SIP OF WATER :                           Tylenol if needed             Atenolol (tenormin)             Clonazepam (klonopin)             Cyclobenzaprine (flexeril)             Oxycodone if needed               7 days prior to surgery STOP taking any Aspirin(unless otherwise instructed by your surgeon), Aleve, Naproxen, Ibuprofen, Motrin, Advil, Goody's, BC's, all herbal medications, fish oil, and all vitamins                Follow your surgeon's instructions on when to stop Asprin.  If no instructions were given by your surgeon then you will need to call the office to get those instructions.      How to Manage Your Diabetes Before and After Surgery  Why is it important to control my blood sugar before and after surgery? . Improving blood sugar levels before and after surgery helps healing and can limit problems. . A way of improving blood sugar control is eating a healthy diet by: o  Eating less sugar and carbohydrates o  Increasing activity/exercise o  Talking with your doctor about reaching your blood sugar goals . High blood sugars (greater than 180 mg/dL) can raise your risk of infections and slow your recovery, so you will need to focus on controlling your diabetes during the weeks before surgery. . Make sure that the doctor who takes care of your diabetes knows about your  planned surgery including the date and location.  How do I manage my blood sugar before surgery? . Check your blood sugar at least 4 times a day, starting 2 days before surgery, to make sure that the level is not too high or low. o Check your blood sugar the morning of your surgery when you wake up and every 2 hours until you get to the Short Stay unit. . If your blood sugar is less than 70 mg/dL, you will need to treat for low blood sugar: o Do not take insulin. o Treat a low blood sugar (less than 70 mg/dL) with  cup of clear juice (cranberry or apple), 4 glucose tablets, OR glucose gel. Recheck blood sugar in 15 minutes after treatment (to make sure it is greater than 70 mg/dL). If your blood sugar is not greater than 70 mg/dL on recheck, call [phone] o  for further instructions. . Report your blood sugar to the short stay nurse when you get to Short Stay.  . If you are admitted to  the hospital after surgery: o Your blood sugar will be checked by the staff and you will probably be given insulin after surgery (instead of oral diabetes medicines) to make sure you have good blood sugar levels. o The goal for blood sugar control after surgery is 80-180 mg/dL.       WHAT DO I DO ABOUT MY DIABETES MEDICATION?   Marland Kitchen Do not take oral diabetes medicines (pills) the morning of surgery.     Do not wear jewelry, make-up or nail polish.  Do not wear lotions, powders, or perfumes, or deodorant.  Do not shave 48 hours prior to surgery.  Men may shave face and neck.  Do not bring valuables to the hospital.  [facility] is not responsible for any belongings or valuables.  Contacts, dentures or bridgework may not be worn into surgery.  Leave your suitcase in the car.  After surgery it may be brought to your room.  For patients admitted to the hospital, discharge time will be determined by your treatment team.  Patients discharged the day of surgery will not be allowed to drive home.     Special instructions:  [facility]- Preparing For Surgery  Before surgery, you can play an important role. Because skin is not sterile, your skin needs to be as free of germs as possible. You can reduce the number of germs on your skin by washing with CHG (chlorahexidine gluconate) Soap before surgery.  CHG is an antiseptic cleaner which kills germs and bonds with the skin to continue killing germs even after washing.    Oral Hygiene is also important to reduce your risk of infection.  Remember - BRUSH YOUR TEETH THE MORNING OF SURGERY WITH YOUR REGULAR TOOTHPASTE  Please do not use if you have an allergy to CHG or antibacterial soaps. If your skin becomes reddened/irritated stop using the CHG.  Do not shave (including legs and underarms) for at least 48 hours prior to first CHG shower. It is OK to shave your face.  Please follow these instructions carefully.   1. Shower the NIGHT BEFORE SURGERY and the MORNING OF SURGERY with CHG.   2. If you chose to wash your hair, wash your hair first as usual with your normal shampoo.  3. After you shampoo, rinse your hair and body thoroughly to remove the shampoo.  4. Use CHG as you would any other liquid soap. You can apply CHG directly to the skin and wash gently with a scrungie or a clean washcloth.   5. Apply the CHG Soap to your body ONLY FROM THE NECK DOWN.  Do not use on open wounds or open sores. Avoid contact with your eyes, ears, mouth and genitals (private parts). Wash Face and genitals (private parts)  with your normal soap.  6. Wash thoroughly, paying special attention to the area where your surgery will be performed.  7. Thoroughly rinse your body with warm water from the neck down.  8. DO NOT shower/wash with your normal soap after using and rinsing off the CHG Soap.  9. Pat yourself dry with a CLEAN TOWEL.  10. Wear CLEAN PAJAMAS to bed the night before surgery, wear comfortable clothes the morning of surgery  11. Place  CLEAN SHEETS on your bed the night of your first shower and DO NOT SLEEP WITH PETS.    Day of Surgery:  Do not apply any deodorants/lotions.  Please wear clean clothes to the hospital/surgery center.   Remember to brush your teeth  WITH YOUR REGULAR TOOTHPASTE.    Please read over the following fact sheets that you were given. Coughing and Deep Breathing, MRSA Information and Surgical Site Infection Prevention

## 2018-06-18 ENCOUNTER — Encounter (HOSPITAL_COMMUNITY)
Admission: RE | Admit: 2018-06-18 | Discharge: 2018-06-18 | Disposition: A | Discharge status: Home / Self Care | Payer: PPO | Source: Ambulatory Visit | Attending: Neurosurgery | Admitting: Neurosurgery

## 2018-06-18 ENCOUNTER — Encounter (HOSPITAL_COMMUNITY): Payer: Self-pay

## 2018-06-18 ENCOUNTER — Other Ambulatory Visit: Payer: Self-pay

## 2018-06-18 DIAGNOSIS — Z01812 Encounter for preprocedural laboratory examination: Secondary | ICD-10-CM | POA: Diagnosis not present

## 2018-06-18 HISTORY — DX: Fibromyalgia: M79.7

## 2018-06-18 HISTORY — DX: Depression, unspecified: F32.A

## 2018-06-18 HISTORY — DX: Personal history of urinary calculi: Z87.442

## 2018-06-18 HISTORY — DX: Major depressive disorder, single episode, unspecified: F32.9

## 2018-06-18 LAB — TYPE AND SCREEN
ABO/RH(D): O POS
ANTIBODY SCREEN: NEGATIVE

## 2018-06-18 LAB — GLUCOSE, CAPILLARY: Glucose-Capillary: 85 mg/dL (ref 70–99)

## 2018-06-18 LAB — ABO/RH: ABO/RH(D): O POS

## 2018-06-18 LAB — SURGICAL PCR SCREEN
MRSA, PCR: NEGATIVE
STAPHYLOCOCCUS AUREUS: POSITIVE — AB

## 2018-06-18 LAB — BASIC METABOLIC PANEL
ANION GAP: 10 (ref 5–15)
BUN: 21 mg/dL — AB (ref 6–20)
CO2: 25 mmol/L (ref 22–32)
Calcium: 9.4 mg/dL (ref 8.9–10.3)
Chloride: 104 mmol/L (ref 98–111)
Creatinine, Ser: 0.99 mg/dL (ref 0.44–1.00)
Glucose, Bld: 93 mg/dL (ref 70–99)
Potassium: 3.9 mmol/L (ref 3.5–5.1)
SODIUM: 139 mmol/L (ref 135–145)

## 2018-06-18 LAB — HEMOGLOBIN A1C
Hgb A1c MFr Bld: 5.9 % — ABNORMAL HIGH (ref 4.8–5.6)
Mean Plasma Glucose: 122.63 mg/dL

## 2018-06-18 LAB — CBC
HCT: 41.6 % (ref 36.0–46.0)
Hemoglobin: 13.5 g/dL (ref 12.0–15.0)
MCH: 30.3 pg (ref 26.0–34.0)
MCHC: 32.5 g/dL (ref 30.0–36.0)
MCV: 93.3 fL (ref 78.0–100.0)
Platelets: 265 10*3/uL (ref 150–400)
RBC: 4.46 MIL/uL (ref 3.87–5.11)
RDW: 13 % (ref 11.5–15.5)
WBC: 8.7 10*3/uL (ref 4.0–10.5)

## 2018-06-18 NOTE — Progress Notes (Signed)
Pt tested positive for Staph.  Mupirocin Ointment called in to [facility]).

## 2018-06-18 NOTE — Progress Notes (Signed)
PCP: Thereasa Distance  Cardiologist: Lujean Amel [facility]) Records being requested for: Cardiac Clearance, Echo, Heart Ablasions, Cardiac caths, stress test  DM: Type II - Diet controlled, not currently on diabetic medications Checks BS every other day Fasting sugars: 75-85  Sleep Apnea: Yes - wears CPAP Sleep Study - being requested from Carloyn Manner (ENT)  EKG: 06-13-18 (by Dr. Clayborn Bigness) - pt brought in latest EKG results  Pt denies SOB, cough, fever, chest pain.  Pt states understanding of Pre-Op instructions.  Chart being sent to Anesthesia for review.

## 2018-06-18 NOTE — Pre-Procedure Instructions (Signed)
Erin Campos  06/18/2018      [place], [place] - [address] [address] Phone: [phone] Fax: [phone]  [id number] - [place], [place] [address] [place] [address] [place] [address] Phone: [phone] Fax: [phone]    Your procedure is scheduled on Oct. 3  Report to [place] at 5:30 A.M.  Call this number if you have problems the morning of surgery:  [phone]   Remember:  Do not eat or drink after midnight.      Take these medicines the morning of surgery with A SIP OF WATER :                           Tylenol if needed             Atenolol (tenormin)             Clonazepam (klonopin)             Cyclobenzaprine (flexeril)             Oxycodone if needed               7 days prior to surgery STOP taking any Aspirin(unless otherwise instructed by your surgeon), Aleve, Naproxen, Ibuprofen, Motrin, Advil, Goody's, BC's, all herbal medications, fish oil, and all vitamins                Follow your surgeon's instructions on when to stop Asprin.  If no instructions were given by your surgeon then you will need to call the office to get those instructions.      How to Manage Your Diabetes Before and After Surgery  Why is it important to control my blood sugar before and after surgery? . Improving blood sugar levels before and after surgery helps healing and can limit problems. . A way of improving blood sugar control is eating a healthy diet by: o  Eating less sugar and carbohydrates o  Increasing activity/exercise o  Talking with your doctor about reaching your blood sugar goals . High blood sugars (greater than 180 mg/dL) can raise your risk of infections and slow your recovery, so you will need to focus on controlling your diabetes during the weeks before surgery. . Make sure that the doctor who takes care of your diabetes knows about your  planned surgery including the date and location.  How do I manage my blood sugar before surgery? . Check your blood sugar at least 4 times a day, starting 2 days before surgery, to make sure that the level is not too high or low. o Check your blood sugar the morning of your surgery when you wake up and every 2 hours until you get to the Short Stay unit. . If your blood sugar is less than 70 mg/dL, you will need to treat for low blood sugar: o Do not take insulin. o Treat a low blood sugar (less than 70 mg/dL) with  cup of clear juice (cranberry or apple), 4 glucose tablets, OR glucose gel. Recheck blood sugar in 15 minutes after treatment (to make sure it is greater than 70 mg/dL). If your blood sugar is not greater than 70 mg/dL on recheck, call [phone] o  for further instructions. . Report your blood sugar to the short stay nurse when you get to Short Stay.  . If you are admitted to  the hospital after surgery: o Your blood sugar will be checked by the staff and you will probably be given insulin after surgery (instead of oral diabetes medicines) to make sure you have good blood sugar levels. o The goal for blood sugar control after surgery is 80-180 mg/dL.       WHAT DO I DO ABOUT MY DIABETES MEDICATION?  Marland Kitchen Do not take oral diabetes medicines (pills) the morning of surgery.     Do not wear jewelry, make-up or nail polish.  Do not wear lotions, powders, or perfumes, or deodorant.  Do not shave 48 hours prior to surgery.    Do not bring valuables to the hospital.  [facility] is not responsible for any belongings or valuables.  Contacts, dentures or bridgework may not be worn into surgery.  Leave your suitcase in the car.  After surgery it may be brought to your room.  For patients admitted to the hospital, discharge time will be determined by your treatment team.  Patients discharged the day of surgery will not be allowed to drive home.    Special instructions:  [facility]- Preparing For Surgery  Before surgery, you can play an important role. Because skin is not sterile, your skin needs to be as free of germs as possible. You can reduce the number of germs on your skin by washing with CHG (chlorahexidine gluconate) Soap before surgery.  CHG is an antiseptic cleaner which kills germs and bonds with the skin to continue killing germs even after washing.    Oral Hygiene is also important to reduce your risk of infection.  Remember - BRUSH YOUR TEETH THE MORNING OF SURGERY WITH YOUR REGULAR TOOTHPASTE  Please do not use if you have an allergy to CHG or antibacterial soaps. If your skin becomes reddened/irritated stop using the CHG.  Do not shave (including legs and underarms) for at least 48 hours prior to first CHG shower. It is OK to shave your face.  Please follow these instructions carefully.   1. Shower the NIGHT BEFORE SURGERY and the MORNING OF SURGERY with CHG.   2. If you chose to wash your hair, wash your hair first as usual with your normal shampoo.  3. After you shampoo, rinse your hair and body thoroughly to remove the shampoo.  4. Use CHG as you would any other liquid soap. You can apply CHG directly to the skin and wash gently with a scrungie or a clean washcloth.   5. Apply the CHG Soap to your body ONLY FROM THE NECK DOWN.  Do not use on open wounds or open sores. Avoid contact with your eyes, ears, mouth and genitals (private parts). Wash Face and genitals (private parts)  with your normal soap.  6. Wash thoroughly, paying special attention to the area where your surgery will be performed.  7. Thoroughly rinse your body with warm water from the neck down.  8. DO NOT shower/wash with your normal soap after using and rinsing off the CHG Soap.  9. Pat yourself dry with a CLEAN TOWEL.  10. Wear CLEAN PAJAMAS to bed the night before surgery, wear comfortable clothes the morning of surgery  11. Place CLEAN SHEETS on your bed the night of  your first shower and DO NOT SLEEP WITH PETS.    Day of Surgery:  Do not apply any deodorants/lotions.  Please wear clean clothes to the hospital/surgery center.   Remember to brush your teeth WITH YOUR REGULAR TOOTHPASTE.  Please read over the following fact sheets that you were given. Coughing and Deep Breathing, MRSA Information and Surgical Site Infection Prevention

## 2018-06-19 NOTE — Progress Notes (Signed)
Anesthesia Chart Review:  Case:  [id number] Date/Time:  06/27/18 0715   Procedure:  POSTERIOR LUMBAR INTERBODY FUSION, INTERBODY PROSTHESIS, WITH INSTRUMENTATION LUMBAR 4- LUMBAR 5 (N/A ) - POSTERIOR LUMBAR INTERBODY FUSION, INTERBODY PROSTHESIS, WITH INSTRUMENTATION LUMBAR 4- LUMBAR 5   Anesthesia type:  General   Pre-op diagnosis:  SPONDYLOLISTHESIS, LUMBAR REGION   Location:  [place] OR ROOM 19 / [place] OR   Surgeon:  Newman Pies, MD      DISCUSSION: 62 yo female former smoker for above procedure. Pertinent hx includes SVT (s/p ablation 2014), HTN, DMII, Fibromyalgia, Anxiety, OSA on CPAP, Depression.  She follows with cardiology at [facility] Dr. Clayborn Bigness for history of SVT s/p ablation. Per last OV note 06/13/2018 pt doing well with rare occasional palpitations. Pt cleared for upcoming surgery "preop patient appears to be an acceptable surgical risk we will clear the patient is a mild risk".  Anticipate she can proceed with surgery as planned barring acute status change.  VS: BP (!) 142/81   Pulse (!) 58   Temp 36.7 C   Resp 20   Ht 5\' 7"  (1.702 m)   Wt 97.8 kg   SpO2 99%   BMI 33.75 kg/m   PROVIDERS: Sofie Hartigan, MD is PCP  Lujean Amel, MD is Cardiologsit  LABS: Labs reviewed: Acceptable for surgery. (all labs ordered are listed, but only abnormal results are displayed)  Labs Reviewed  SURGICAL PCR SCREEN - Abnormal; Notable for the following components:      Result Value   Staphylococcus aureus POSITIVE (*)    All other components within normal limits  BASIC METABOLIC PANEL - Abnormal; Notable for the following components:   BUN 21 (*)    All other components within normal limits  HEMOGLOBIN A1C - Abnormal; Notable for the following components:   Hgb A1c MFr Bld 5.9 (*)    All other components within normal limits  GLUCOSE, CAPILLARY  CBC  TYPE AND SCREEN  ABO/RH     IMAGES: TECHNIQUE: PA and lateral views of the chest 03/07/2017 (care  everywhere).  FINDINGS:  No consolidation or pulmonary edema. No pleural effusion or pneumothorax. Upper limits of normal heart size. Mildly tortuous descending thoracic aorta. No acute osseous abnormality.  IMPRESSION:  No acute airspace disease.  EKG: 06/13/2018 (outside record, copy on pt chart):  Sinus rhythm. Poor R wave progression. LAD. T wave inversion lead III.  CV: TTE 12/10/2014 (care everywhere): INTERPRETATION NORMAL LEFT VENTRICULAR SYSTOLIC FUNCTION NORMAL RIGHT VENTRICULAR SYSTOLIC FUNCTION NO VALVULAR REGURGITATION NO VALVULAR STENOSIS Normal overall left ventricular function ejection fraction between 50 and 55% Mild to moderate pulmonary hypertension  Past Medical History:  Diagnosis Date  . Anxiety   . Arrhythmia   . Arthritis   . Bulging lumbar disc   . Controlled diabetes mellitus ([place])    Diet controlled  . Cystocele   . DDD (degenerative disc disease), cervical   . Depression   . External hemorrhoids   . Facial basal cell cancer   . Fibromyalgia    Diagnosed in 2010  . Heart attack ([place])    Resulted on EKG -2015  . High cholesterol   . History of kidney stones   . HTN (hypertension)   . Rectocele   . Recurrent UTI   . Sleep apnea    CPAP  . Spleen anomaly    compressed  . SVT (supraventricular tachycardia) ([facility])     Past Surgical History:  Procedure Laterality Date  . BASAL CELL CARCINOMA  EXCISION     face  . bladder tack  2004  . CARDIAC CATHETERIZATION    . COSMETIC SURGERY  2014   Lip  . CYSTOSCOPY W/ RETROGRADES Bilateral 10/04/2015   Procedure: CYSTOSCOPY WITH RETROGRADE PYELOGRAM;  Surgeon: Hollice Espy, MD;  Location: [facility] ORS;  Service: Urology;  Laterality: Bilateral;  . Heart ablation    . KIDNEY STONE SURGERY     stents  . VAGINAL HYSTERECTOMY      MEDICATIONS: . acetaminophen (TYLENOL) 325 MG tablet  . aspirin EC 81 MG tablet  . atenolol (TENORMIN) 25 MG tablet  . Calcium Polycarbophil (FIBERCON PO)  .  clonazePAM (KLONOPIN) 0.5 MG tablet  . cyclobenzaprine (FLEXERIL) 5 MG tablet  . docusate sodium (COLACE) 250 MG capsule  . estradiol (ESTRACE) 0.1 MG/GM vaginal cream  . estradiol (ESTRING) 2 MG vaginal ring  . fenofibrate (TRICOR) 145 MG tablet  . glucose blood test strip  . KLOR-CON M20 20 MEQ tablet  . losartan (COZAAR) 50 MG tablet  . Magnesium 500 MG CAPS  . Melatonin 5 MG CAPS  . niacin 500 MG tablet  . nortriptyline (PAMELOR) 75 MG capsule  . oxyCODONE (OXY IR/ROXICODONE) 5 MG immediate release tablet  . polyethylene glycol (MIRALAX / GLYCOLAX) packet  . rOPINIRole (REQUIP) 0.5 MG tablet  . temazepam (RESTORIL) 15 MG capsule  . triamterene-hydrochlorothiazide (MAXZIDE-25) 37.5-25 MG per tablet   No current facility-administered medications for this encounter.     Wynonia Musty [facility] Short Stay Center/Anesthesiology Phone [phone] 06/19/2018 3:48 PM

## 2018-06-20 ENCOUNTER — Encounter: Payer: PPO | Admitting: Obstetrics and Gynecology

## 2018-06-26 DIAGNOSIS — F419 Anxiety disorder, unspecified: Secondary | ICD-10-CM | POA: Diagnosis not present

## 2018-06-26 DIAGNOSIS — I10 Essential (primary) hypertension: Secondary | ICD-10-CM | POA: Diagnosis not present

## 2018-06-26 DIAGNOSIS — Z0000 Encounter for general adult medical examination without abnormal findings: Secondary | ICD-10-CM | POA: Diagnosis not present

## 2018-06-26 DIAGNOSIS — M5136 Other intervertebral disc degeneration, lumbar region: Secondary | ICD-10-CM | POA: Diagnosis not present

## 2018-06-26 DIAGNOSIS — E782 Mixed hyperlipidemia: Secondary | ICD-10-CM | POA: Diagnosis not present

## 2018-06-26 DIAGNOSIS — G2581 Restless legs syndrome: Secondary | ICD-10-CM | POA: Diagnosis not present

## 2018-06-26 DIAGNOSIS — E1121 Type 2 diabetes mellitus with diabetic nephropathy: Secondary | ICD-10-CM | POA: Diagnosis not present

## 2018-06-26 DIAGNOSIS — G4733 Obstructive sleep apnea (adult) (pediatric): Secondary | ICD-10-CM | POA: Diagnosis not present

## 2018-06-26 NOTE — Anesthesia Preprocedure Evaluation (Addendum)
Anesthesia Evaluation  Patient identified by MRN, date of birth, ID band Patient awake    Reviewed: Allergy & Precautions, NPO status , Patient's Chart, lab work & pertinent test results  Airway Mallampati: II  TM Distance: >3 FB Neck ROM: Full    Dental  (+) Teeth Intact, Dental Advisory Given   Pulmonary sleep apnea and Continuous Positive Airway Pressure Ventilation , former smoker,    breath sounds clear to auscultation       Cardiovascular hypertension, Pt. on medications and Pt. on home beta blockers + dysrhythmias (s/p ablation) Supra Ventricular Tachycardia  Rhythm:Regular Rate:Normal  CV: TTE 12/10/2014 (care everywhere): INTERPRETATION NORMAL LEFT VENTRICULAR SYSTOLIC FUNCTION NORMAL RIGHT VENTRICULAR SYSTOLIC FUNCTION NO VALVULAR REGURGITATION NO VALVULAR STENOSIS Normal overall left ventricular function ejection fraction between 50 and 55% Mild to moderate pulmonary hypertension   Neuro/Psych  Neuromuscular disease    GI/Hepatic negative GI ROS, Neg liver ROS,   Endo/Other  diabetes, Well Controlled, Type 2  Renal/GU negative Renal ROS     Musculoskeletal  (+) Arthritis , Fibromyalgia -  Abdominal   Peds  Hematology negative hematology ROS (+)   Anesthesia Other Findings   Reproductive/Obstetrics                           Lab Results  Component Value Date   WBC 8.7 06/18/2018   HGB 13.5 06/18/2018   HCT 41.6 06/18/2018   MCV 93.3 06/18/2018   PLT 265 06/18/2018   Lab Results  Component Value Date   CREATININE 0.99 06/18/2018   BUN 21 (H) 06/18/2018   NA 139 06/18/2018   K 3.9 06/18/2018   CL 104 06/18/2018   CO2 25 06/18/2018    Anesthesia Physical Anesthesia Plan  ASA: II  Anesthesia Plan: General   Post-op Pain Management:    Induction: Intravenous  PONV Risk Score and Plan: 2 and Ondansetron  Airway Management Planned: Oral ETT  Additional  Equipment:   Intra-op Plan:   Post-operative Plan: Extubation in OR  Informed Consent: I have reviewed the patients History and Physical, chart, labs and discussed the procedure including the risks, benefits and alternatives for the proposed anesthesia with the patient or authorized representative who has indicated his/her understanding and acceptance.   Dental advisory given  Plan Discussed with: CRNA, Anesthesiologist and Surgeon  Anesthesia Plan Comments:        Anesthesia Quick Evaluation

## 2018-06-27 ENCOUNTER — Inpatient Hospital Stay (HOSPITAL_COMMUNITY)
Admission: RE | Admit: 2018-06-27 | Discharge: 2018-07-02 | DRG: 454 | Disposition: A | Discharge status: Home Health | Payer: PPO | Attending: Neurosurgery | Admitting: Neurosurgery

## 2018-06-27 ENCOUNTER — Inpatient Hospital Stay (HOSPITAL_COMMUNITY): Payer: PPO | Admitting: Physician Assistant

## 2018-06-27 ENCOUNTER — Encounter (HOSPITAL_COMMUNITY)
Admission: RE | Disposition: A | Discharge status: Home Health | Payer: Self-pay | Source: Home / Self Care | Attending: Neurosurgery

## 2018-06-27 ENCOUNTER — Encounter (HOSPITAL_COMMUNITY): Payer: Self-pay | Admitting: *Deleted

## 2018-06-27 ENCOUNTER — Inpatient Hospital Stay (HOSPITAL_COMMUNITY): Payer: PPO

## 2018-06-27 ENCOUNTER — Inpatient Hospital Stay (HOSPITAL_COMMUNITY): Payer: PPO | Admitting: Certified Registered"

## 2018-06-27 ENCOUNTER — Other Ambulatory Visit: Payer: Self-pay

## 2018-06-27 DIAGNOSIS — I471 Supraventricular tachycardia: Secondary | ICD-10-CM | POA: Diagnosis present

## 2018-06-27 DIAGNOSIS — Z6831 Body mass index (BMI) 31.0-31.9, adult: Secondary | ICD-10-CM

## 2018-06-27 DIAGNOSIS — G8929 Other chronic pain: Secondary | ICD-10-CM | POA: Diagnosis not present

## 2018-06-27 DIAGNOSIS — M5126 Other intervertebral disc displacement, lumbar region: Secondary | ICD-10-CM | POA: Diagnosis not present

## 2018-06-27 DIAGNOSIS — Z7982 Long term (current) use of aspirin: Secondary | ICD-10-CM | POA: Diagnosis not present

## 2018-06-27 DIAGNOSIS — Z981 Arthrodesis status: Secondary | ICD-10-CM | POA: Diagnosis not present

## 2018-06-27 DIAGNOSIS — E1169 Type 2 diabetes mellitus with other specified complication: Secondary | ICD-10-CM | POA: Diagnosis not present

## 2018-06-27 DIAGNOSIS — I10 Essential (primary) hypertension: Secondary | ICD-10-CM | POA: Diagnosis present

## 2018-06-27 DIAGNOSIS — Z833 Family history of diabetes mellitus: Secondary | ICD-10-CM | POA: Diagnosis not present

## 2018-06-27 DIAGNOSIS — M48062 Spinal stenosis, lumbar region with neurogenic claudication: Principal | ICD-10-CM | POA: Diagnosis present

## 2018-06-27 DIAGNOSIS — Z87442 Personal history of urinary calculi: Secondary | ICD-10-CM

## 2018-06-27 DIAGNOSIS — Z882 Allergy status to sulfonamides status: Secondary | ICD-10-CM | POA: Diagnosis not present

## 2018-06-27 DIAGNOSIS — Z87440 Personal history of urinary (tract) infections: Secondary | ICD-10-CM

## 2018-06-27 DIAGNOSIS — Z87891 Personal history of nicotine dependence: Secondary | ICD-10-CM

## 2018-06-27 DIAGNOSIS — D72829 Elevated white blood cell count, unspecified: Secondary | ICD-10-CM | POA: Diagnosis present

## 2018-06-27 DIAGNOSIS — Z8249 Family history of ischemic heart disease and other diseases of the circulatory system: Secondary | ICD-10-CM

## 2018-06-27 DIAGNOSIS — F411 Generalized anxiety disorder: Secondary | ICD-10-CM | POA: Diagnosis not present

## 2018-06-27 DIAGNOSIS — G8918 Other acute postprocedural pain: Secondary | ICD-10-CM | POA: Diagnosis not present

## 2018-06-27 DIAGNOSIS — Z808 Family history of malignant neoplasm of other organs or systems: Secondary | ICD-10-CM | POA: Diagnosis not present

## 2018-06-27 DIAGNOSIS — Z419 Encounter for procedure for purposes other than remedying health state, unspecified: Secondary | ICD-10-CM

## 2018-06-27 DIAGNOSIS — R55 Syncope and collapse: Secondary | ICD-10-CM | POA: Diagnosis not present

## 2018-06-27 DIAGNOSIS — M4316 Spondylolisthesis, lumbar region: Secondary | ICD-10-CM | POA: Diagnosis not present

## 2018-06-27 DIAGNOSIS — Z85828 Personal history of other malignant neoplasm of skin: Secondary | ICD-10-CM

## 2018-06-27 DIAGNOSIS — E669 Obesity, unspecified: Secondary | ICD-10-CM | POA: Diagnosis present

## 2018-06-27 DIAGNOSIS — M5442 Lumbago with sciatica, left side: Secondary | ICD-10-CM

## 2018-06-27 DIAGNOSIS — Z91041 Radiographic dye allergy status: Secondary | ICD-10-CM

## 2018-06-27 DIAGNOSIS — M5116 Intervertebral disc disorders with radiculopathy, lumbar region: Secondary | ICD-10-CM | POA: Diagnosis present

## 2018-06-27 DIAGNOSIS — M4326 Fusion of spine, lumbar region: Secondary | ICD-10-CM | POA: Diagnosis not present

## 2018-06-27 DIAGNOSIS — E119 Type 2 diabetes mellitus without complications: Secondary | ICD-10-CM | POA: Diagnosis present

## 2018-06-27 DIAGNOSIS — Z79890 Hormone replacement therapy (postmenopausal): Secondary | ICD-10-CM | POA: Diagnosis not present

## 2018-06-27 LAB — GLUCOSE, CAPILLARY
GLUCOSE-CAPILLARY: 155 mg/dL — AB (ref 70–99)
GLUCOSE-CAPILLARY: 113 mg/dL — AB (ref 70–99)
Glucose-Capillary: 98 mg/dL (ref 70–99)
Glucose-Capillary: 119 mg/dL — ABNORMAL HIGH (ref 70–99)

## 2018-06-27 SURGERY — POSTERIOR LUMBAR FUSION 1 LEVEL
Anesthesia: General | Site: Spine Lumbar

## 2018-06-27 MED ORDER — BUPIVACAINE-EPINEPHRINE (PF) 0.5% -1:200000 IJ SOLN
INTRAMUSCULAR | Status: DC | PRN
  Administered 2018-06-27: 10 mL

## 2018-06-27 MED ORDER — LACTATED RINGERS IV SOLN
INTRAVENOUS | Status: DC | PRN
  Administered 2018-06-27 (×2): via INTRAVENOUS

## 2018-06-27 MED ORDER — ACETAMINOPHEN 650 MG RE SUPP: 650.0000 mg | RECTAL | Status: DC | PRN

## 2018-06-27 MED ORDER — DOCUSATE SODIUM 50 MG PO CAPS
250.0000 mg | ORAL_CAPSULE | Freq: Every day | ORAL | Status: DC
  Administered 2018-06-28 – 2018-07-02 (×5): 250 mg via ORAL
  Filled 2018-06-27 (×6): qty 1

## 2018-06-27 MED ORDER — OXYCODONE HCL 5 MG PO TABS
5.0000 mg | ORAL_TABLET | ORAL | Status: DC | PRN
  Administered 2018-06-27 – 2018-07-02 (×4): 5 mg via ORAL
  Filled 2018-06-27 (×3): qty 1

## 2018-06-27 MED ORDER — THROMBIN 5000 UNITS EX SOLR
CUTANEOUS | Status: AC
  Filled 2018-06-27: qty 5000

## 2018-06-27 MED ORDER — LIDOCAINE 2% (20 MG/ML) 5 ML SYRINGE
INTRAMUSCULAR | Status: AC
  Filled 2018-06-27: qty 5

## 2018-06-27 MED ORDER — ONDANSETRON HCL 4 MG PO TABS: 4.0000 mg | ORAL_TABLET | Freq: Four times a day (QID) | ORAL | Status: DC | PRN

## 2018-06-27 MED ORDER — MORPHINE SULFATE (PF) 4 MG/ML IV SOLN: 4.0000 mg | INTRAVENOUS | Status: DC | PRN

## 2018-06-27 MED ORDER — ATENOLOL 25 MG PO TABS
25.0000 mg | ORAL_TABLET | Freq: Every day | ORAL | Status: DC
  Administered 2018-06-28 – 2018-07-02 (×4): 25 mg via ORAL
  Filled 2018-06-27 (×6): qty 1

## 2018-06-27 MED ORDER — POTASSIUM CHLORIDE CRYS ER 20 MEQ PO TBCR
20.0000 meq | EXTENDED_RELEASE_TABLET | Freq: Every day | ORAL | Status: DC
  Administered 2018-06-27 – 2018-07-02 (×6): 20 meq via ORAL
  Filled 2018-06-27 (×6): qty 1

## 2018-06-27 MED ORDER — VANCOMYCIN HCL 1 G IV SOLR
INTRAVENOUS | Status: DC | PRN
  Administered 2018-06-27: 1000 mg

## 2018-06-27 MED ORDER — ACETAMINOPHEN 500 MG PO TABS
1000.0000 mg | ORAL_TABLET | Freq: Four times a day (QID) | ORAL | Status: AC
  Administered 2018-06-27 (×3): 1000 mg via ORAL
  Filled 2018-06-27 (×3): qty 2

## 2018-06-27 MED ORDER — DEXAMETHASONE SODIUM PHOSPHATE 10 MG/ML IJ SOLN
INTRAMUSCULAR | Status: DC | PRN
  Administered 2018-06-27: 5 mg via INTRAVENOUS

## 2018-06-27 MED ORDER — ONDANSETRON HCL 4 MG/2ML IJ SOLN
INTRAMUSCULAR | Status: DC | PRN
  Administered 2018-06-27: 4 mg via INTRAVENOUS

## 2018-06-27 MED ORDER — BUPIVACAINE LIPOSOME 1.3 % IJ SUSP
20.0000 mL | INTRAMUSCULAR | Status: AC
  Administered 2018-06-27: 20 mL
  Filled 2018-06-27: qty 20

## 2018-06-27 MED ORDER — SODIUM CHLORIDE 0.9 % IV SOLN
250.0000 mL | INTRAVENOUS | Status: DC
  Administered 2018-06-27 (×2): 250 mL via INTRAVENOUS

## 2018-06-27 MED ORDER — NALOXONE HCL 0.4 MG/ML IJ SOLN
INTRAMUSCULAR | Status: AC
  Filled 2018-06-27: qty 1

## 2018-06-27 MED ORDER — SODIUM CHLORIDE 0.9% FLUSH
3.0000 mL | INTRAVENOUS | Status: DC | PRN
  Administered 2018-07-01: 3 mL via INTRAVENOUS
  Filled 2018-06-27: qty 3

## 2018-06-27 MED ORDER — OXYCODONE HCL 5 MG PO TABS
ORAL_TABLET | ORAL | Status: AC
  Filled 2018-06-27: qty 1

## 2018-06-27 MED ORDER — MAGNESIUM 500 MG PO CAPS: 500.0000 mg | ORAL_CAPSULE | Freq: Every day | ORAL | Status: DC

## 2018-06-27 MED ORDER — POLYETHYLENE GLYCOL 3350 17 G PO PACK
17.0000 g | PACK | Freq: Every day | ORAL | Status: DC
  Administered 2018-06-27 – 2018-07-01 (×5): 17 g via ORAL
  Filled 2018-06-27 (×4): qty 1

## 2018-06-27 MED ORDER — 0.9 % SODIUM CHLORIDE (POUR BTL) OPTIME
TOPICAL | Status: DC | PRN
  Administered 2018-06-27: 1000 mL

## 2018-06-27 MED ORDER — OXYCODONE HCL 5 MG PO TABS
10.0000 mg | ORAL_TABLET | ORAL | Status: DC | PRN
  Administered 2018-06-27 – 2018-07-02 (×20): 10 mg via ORAL
  Filled 2018-06-27 (×20): qty 2

## 2018-06-27 MED ORDER — CEFAZOLIN SODIUM-DEXTROSE 2-4 GM/100ML-% IV SOLN
INTRAVENOUS | Status: AC
  Filled 2018-06-27: qty 100

## 2018-06-27 MED ORDER — ROCURONIUM BROMIDE 50 MG/5ML IV SOSY
PREFILLED_SYRINGE | INTRAVENOUS | Status: AC
  Filled 2018-06-27: qty 5

## 2018-06-27 MED ORDER — LIDOCAINE 2% (20 MG/ML) 5 ML SYRINGE
INTRAMUSCULAR | Status: DC | PRN
  Administered 2018-06-27: 80 mg via INTRAVENOUS

## 2018-06-27 MED ORDER — CEFAZOLIN SODIUM-DEXTROSE 2-4 GM/100ML-% IV SOLN
2.0000 g | Freq: Three times a day (TID) | INTRAVENOUS | Status: AC
  Administered 2018-06-27 (×2): 2 g via INTRAVENOUS
  Filled 2018-06-27 (×2): qty 100

## 2018-06-27 MED ORDER — ESTRADIOL 0.1 MG/GM VA CREA
1.0000 | TOPICAL_CREAM | VAGINAL | Status: DC
  Filled 2018-06-27: qty 42.5

## 2018-06-27 MED ORDER — CEFAZOLIN SODIUM-DEXTROSE 2-4 GM/100ML-% IV SOLN
2.0000 g | INTRAVENOUS | Status: AC
  Administered 2018-06-27: 2 g via INTRAVENOUS

## 2018-06-27 MED ORDER — THROMBIN 20000 UNITS EX SOLR
CUTANEOUS | Status: DC | PRN
  Administered 2018-06-27: 09:00:00

## 2018-06-27 MED ORDER — BUPIVACAINE-EPINEPHRINE 0.5% -1:200000 IJ SOLN
INTRAMUSCULAR | Status: AC
  Filled 2018-06-27: qty 1

## 2018-06-27 MED ORDER — VANCOMYCIN HCL 1000 MG IV SOLR
INTRAVENOUS | Status: AC
  Filled 2018-06-27: qty 1000

## 2018-06-27 MED ORDER — MAGNESIUM OXIDE 400 (241.3 MG) MG PO TABS
400.0000 mg | ORAL_TABLET | Freq: Every day | ORAL | Status: DC
  Administered 2018-06-27 – 2018-07-02 (×6): 400 mg via ORAL
  Filled 2018-06-27 (×7): qty 1

## 2018-06-27 MED ORDER — ONDANSETRON HCL 4 MG/2ML IJ SOLN: 4.0000 mg | Freq: Once | INTRAMUSCULAR | Status: DC | PRN

## 2018-06-27 MED ORDER — SODIUM CHLORIDE 0.9 % IV SOLN
INTRAVENOUS | Status: DC | PRN
  Administered 2018-06-27: 500 mL

## 2018-06-27 MED ORDER — HYDROMORPHONE HCL 1 MG/ML IJ SOLN
0.2500 mg | INTRAMUSCULAR | Status: DC | PRN
  Administered 2018-06-27 (×2): 0.5 mg via INTRAVENOUS

## 2018-06-27 MED ORDER — CLONAZEPAM 0.5 MG PO TABS
0.5000 mg | ORAL_TABLET | Freq: Three times a day (TID) | ORAL | Status: DC | PRN
  Administered 2018-06-27 – 2018-07-01 (×7): 0.5 mg via ORAL
  Filled 2018-06-27 (×7): qty 1

## 2018-06-27 MED ORDER — CYCLOBENZAPRINE HCL 10 MG PO TABS
10.0000 mg | ORAL_TABLET | Freq: Three times a day (TID) | ORAL | Status: DC | PRN
  Administered 2018-06-27 – 2018-07-02 (×13): 10 mg via ORAL
  Filled 2018-06-27 (×11): qty 1

## 2018-06-27 MED ORDER — ZOLPIDEM TARTRATE 5 MG PO TABS: 5.0000 mg | ORAL_TABLET | Freq: Every evening | ORAL | Status: DC | PRN

## 2018-06-27 MED ORDER — CHLORHEXIDINE GLUCONATE CLOTH 2 % EX PADS: 6.0000 | MEDICATED_PAD | Freq: Once | CUTANEOUS | Status: DC

## 2018-06-27 MED ORDER — SODIUM CHLORIDE 0.9% FLUSH
3.0000 mL | Freq: Two times a day (BID) | INTRAVENOUS | Status: DC
  Administered 2018-06-28 – 2018-07-01 (×6): 3 mL via INTRAVENOUS

## 2018-06-27 MED ORDER — ACETAMINOPHEN 325 MG PO TABS
650.0000 mg | ORAL_TABLET | ORAL | Status: DC | PRN
  Administered 2018-06-27 – 2018-07-01 (×9): 650 mg via ORAL
  Filled 2018-06-27 (×9): qty 2

## 2018-06-27 MED ORDER — TEMAZEPAM 7.5 MG PO CAPS
15.0000 mg | ORAL_CAPSULE | Freq: Every day | ORAL | Status: DC | PRN
  Filled 2018-06-27: qty 1

## 2018-06-27 MED ORDER — FENOFIBRATE 160 MG PO TABS
160.0000 mg | ORAL_TABLET | Freq: Every day | ORAL | Status: DC
  Administered 2018-06-27 – 2018-07-01 (×4): 160 mg via ORAL
  Filled 2018-06-27 (×8): qty 1

## 2018-06-27 MED ORDER — DOCUSATE SODIUM 100 MG PO CAPS
100.0000 mg | ORAL_CAPSULE | Freq: Two times a day (BID) | ORAL | Status: DC
  Administered 2018-06-27 – 2018-07-02 (×11): 100 mg via ORAL
  Filled 2018-06-27 (×11): qty 1

## 2018-06-27 MED ORDER — MIDAZOLAM HCL 2 MG/2ML IJ SOLN
INTRAMUSCULAR | Status: AC
  Filled 2018-06-27: qty 2

## 2018-06-27 MED ORDER — ROPINIROLE HCL 1 MG PO TABS: 0.5000 mg | ORAL_TABLET | Freq: Every evening | ORAL | Status: DC | PRN

## 2018-06-27 MED ORDER — THROMBIN 5000 UNITS EX SOLR
OROMUCOSAL | Status: DC | PRN
  Administered 2018-06-27: 09:00:00

## 2018-06-27 MED ORDER — DEXAMETHASONE SODIUM PHOSPHATE 10 MG/ML IJ SOLN
INTRAMUSCULAR | Status: AC
  Filled 2018-06-27: qty 1

## 2018-06-27 MED ORDER — SODIUM CHLORIDE 0.9 % IJ SOLN
INTRAMUSCULAR | Status: AC
  Filled 2018-06-27: qty 10

## 2018-06-27 MED ORDER — CYCLOBENZAPRINE HCL 5 MG PO TABS
5.0000 mg | ORAL_TABLET | Freq: Two times a day (BID) | ORAL | Status: DC | PRN
  Administered 2018-07-01: 10 mg via ORAL
  Filled 2018-06-27 (×2): qty 2

## 2018-06-27 MED ORDER — NORTRIPTYLINE HCL 25 MG PO CAPS
75.0000 mg | ORAL_CAPSULE | Freq: Every day | ORAL | Status: DC
  Administered 2018-06-27 – 2018-07-01 (×5): 75 mg via ORAL
  Filled 2018-06-27 (×5): qty 3

## 2018-06-27 MED ORDER — BACITRACIN ZINC 500 UNIT/GM EX OINT
TOPICAL_OINTMENT | CUTANEOUS | Status: DC | PRN
  Administered 2018-06-27: 1 via TOPICAL

## 2018-06-27 MED ORDER — ONDANSETRON HCL 4 MG/2ML IJ SOLN
INTRAMUSCULAR | Status: AC
  Filled 2018-06-27: qty 2

## 2018-06-27 MED ORDER — ONDANSETRON HCL 4 MG/2ML IJ SOLN: 4.0000 mg | Freq: Four times a day (QID) | INTRAMUSCULAR | Status: DC | PRN

## 2018-06-27 MED ORDER — SODIUM CHLORIDE 0.9 % IV SOLN: 250.0000 mL | INTRAVENOUS | Status: DC

## 2018-06-27 MED ORDER — HYDROMORPHONE HCL 1 MG/ML IJ SOLN
INTRAMUSCULAR | Status: AC
  Filled 2018-06-27: qty 1

## 2018-06-27 MED ORDER — THROMBIN 20000 UNITS EX KIT
PACK | CUTANEOUS | Status: AC
  Filled 2018-06-27: qty 1

## 2018-06-27 MED ORDER — CYCLOBENZAPRINE HCL 10 MG PO TABS
ORAL_TABLET | ORAL | Status: AC
  Filled 2018-06-27: qty 1

## 2018-06-27 MED ORDER — MENTHOL 3 MG MT LOZG: 1.0000 | LOZENGE | OROMUCOSAL | Status: DC | PRN

## 2018-06-27 MED ORDER — PROPOFOL 10 MG/ML IV BOLUS
INTRAVENOUS | Status: DC | PRN
  Administered 2018-06-27: 150 mg via INTRAVENOUS

## 2018-06-27 MED ORDER — NIACIN 500 MG PO TABS
500.0000 mg | ORAL_TABLET | Freq: Every day | ORAL | Status: DC
  Administered 2018-06-27 – 2018-07-01 (×5): 500 mg via ORAL
  Filled 2018-06-27 (×5): qty 1

## 2018-06-27 MED ORDER — MIDAZOLAM HCL 5 MG/5ML IJ SOLN
INTRAMUSCULAR | Status: DC | PRN
  Administered 2018-06-27: 2 mg via INTRAVENOUS

## 2018-06-27 MED ORDER — ROCURONIUM BROMIDE 10 MG/ML (PF) SYRINGE
PREFILLED_SYRINGE | INTRAVENOUS | Status: DC | PRN
  Administered 2018-06-27: 50 mg via INTRAVENOUS

## 2018-06-27 MED ORDER — BISACODYL 10 MG RE SUPP: 10.0000 mg | Freq: Every day | RECTAL | Status: DC | PRN

## 2018-06-27 MED ORDER — PHENOL 1.4 % MT LIQD: 1.0000 | OROMUCOSAL | Status: DC | PRN

## 2018-06-27 MED ORDER — LOSARTAN POTASSIUM 50 MG PO TABS
50.0000 mg | ORAL_TABLET | Freq: Every day | ORAL | Status: DC
  Administered 2018-06-27 – 2018-07-02 (×4): 50 mg via ORAL
  Filled 2018-06-27 (×6): qty 1

## 2018-06-27 MED ORDER — TRIAMTERENE-HCTZ 37.5-25 MG PO TABS
1.0000 | ORAL_TABLET | Freq: Every morning | ORAL | Status: DC
  Administered 2018-06-28 – 2018-07-02 (×3): 1 via ORAL
  Filled 2018-06-27 (×6): qty 1

## 2018-06-27 MED ORDER — SUFENTANIL CITRATE 50 MCG/ML IV SOLN
INTRAVENOUS | Status: DC | PRN
  Administered 2018-06-27 (×2): 10 ug via INTRAVENOUS
  Administered 2018-06-27: 15 ug via INTRAVENOUS
  Administered 2018-06-27: 5 ug via INTRAVENOUS

## 2018-06-27 MED ORDER — SUFENTANIL CITRATE 50 MCG/ML IV SOLN
INTRAVENOUS | Status: AC
  Filled 2018-06-27: qty 1

## 2018-06-27 MED ORDER — PROPOFOL 10 MG/ML IV BOLUS
INTRAVENOUS | Status: AC
  Filled 2018-06-27: qty 20

## 2018-06-27 MED ORDER — NALOXONE HCL 0.4 MG/ML IJ SOLN
0.4000 mg | INTRAMUSCULAR | Status: DC | PRN
  Administered 2018-06-27: 0.04 mg via INTRAVENOUS

## 2018-06-27 MED ORDER — BACITRACIN ZINC 500 UNIT/GM EX OINT
TOPICAL_OINTMENT | CUTANEOUS | Status: AC
  Filled 2018-06-27: qty 28.35

## 2018-06-27 SURGICAL SUPPLY — 70 items
BAG DECANTER FOR FLEXI CONT (MISCELLANEOUS) ×3 IMPLANT
BASKET BONE COLLECTION (BASKET) ×3 IMPLANT
BENZOIN TINCTURE PRP APPL 2/3 (GAUZE/BANDAGES/DRESSINGS) ×3 IMPLANT
BLADE CLIPPER SURG (BLADE) IMPLANT
BUR MATCHSTICK NEURO 3.0 LAGG (BURR) ×3 IMPLANT
BUR PRECISION FLUTE 6.0 (BURR) ×3 IMPLANT
CAGE ALTERA 10X31MM-10-14-15 (Cage) ×1 IMPLANT
CAGE ALTERA 10X31X10-14 15D (Cage) ×2 IMPLANT
CANISTER SUCT 3000ML PPV (MISCELLANEOUS) ×3 IMPLANT
CAP REVERE LOCKING (Cap) ×12 IMPLANT
CARTRIDGE OIL MAESTRO DRILL (MISCELLANEOUS) ×1 IMPLANT
CLOSURE WOUND 1/2 X4 (GAUZE/BANDAGES/DRESSINGS) ×1
CONT SPEC 4OZ CLIKSEAL STRL BL (MISCELLANEOUS) ×3 IMPLANT
COVER BACK TABLE 60X90IN (DRAPES) ×3 IMPLANT
COVER WAND RF STERILE (DRAPES) ×3 IMPLANT
DECANTER SPIKE VIAL GLASS SM (MISCELLANEOUS) ×3 IMPLANT
DERMABOND ADVANCED (GAUZE/BANDAGES/DRESSINGS) ×2
DERMABOND ADVANCED .7 DNX12 (GAUZE/BANDAGES/DRESSINGS) ×1 IMPLANT
DIFFUSER DRILL AIR PNEUMATIC (MISCELLANEOUS) ×3 IMPLANT
DRAPE C-ARM 42X72 X-RAY (DRAPES) ×6 IMPLANT
DRAPE HALF SHEET 40X57 (DRAPES) ×3 IMPLANT
DRAPE LAPAROTOMY 100X72X124 (DRAPES) ×3 IMPLANT
DRAPE SURG 17X23 STRL (DRAPES) ×12 IMPLANT
DRSG OPSITE POSTOP 4X6 (GAUZE/BANDAGES/DRESSINGS) ×3 IMPLANT
ELECT BLADE 4.0 EZ CLEAN MEGAD (MISCELLANEOUS) ×3
ELECT REM PT RETURN 9FT ADLT (ELECTROSURGICAL) ×3
ELECTRODE BLDE 4.0 EZ CLN MEGD (MISCELLANEOUS) ×1 IMPLANT
ELECTRODE REM PT RTRN 9FT ADLT (ELECTROSURGICAL) ×1 IMPLANT
EVACUATOR 1/8 PVC DRAIN (DRAIN) IMPLANT
GAUZE 4X4 16PLY RFD (DISPOSABLE) ×3 IMPLANT
GAUZE SPONGE 4X4 12PLY STRL (GAUZE/BANDAGES/DRESSINGS) ×3 IMPLANT
GLOVE BIO SURGEON STRL SZ7.5 (GLOVE) ×3 IMPLANT
GLOVE BIO SURGEON STRL SZ8 (GLOVE) ×6 IMPLANT
GLOVE BIO SURGEON STRL SZ8.5 (GLOVE) ×6 IMPLANT
GLOVE BIOGEL PI IND STRL 6.5 (GLOVE) ×3 IMPLANT
GLOVE BIOGEL PI IND STRL 7.5 (GLOVE) ×3 IMPLANT
GLOVE BIOGEL PI INDICATOR 6.5 (GLOVE) ×6
GLOVE BIOGEL PI INDICATOR 7.5 (GLOVE) ×6
GLOVE SURG SS PI 6.5 STRL IVOR (GLOVE) ×9 IMPLANT
GOWN STRL REUS W/ TWL LRG LVL3 (GOWN DISPOSABLE) ×1 IMPLANT
GOWN STRL REUS W/ TWL XL LVL3 (GOWN DISPOSABLE) ×2 IMPLANT
GOWN STRL REUS W/TWL 2XL LVL3 (GOWN DISPOSABLE) IMPLANT
GOWN STRL REUS W/TWL LRG LVL3 (GOWN DISPOSABLE) ×2
GOWN STRL REUS W/TWL XL LVL3 (GOWN DISPOSABLE) ×4
HEMOSTAT POWDER KIT SURGIFOAM (HEMOSTASIS) ×3 IMPLANT
KIT BASIN OR (CUSTOM PROCEDURE TRAY) ×3 IMPLANT
KIT TURNOVER KIT B (KITS) ×3 IMPLANT
MILL MEDIUM DISP (BLADE) IMPLANT
NEEDLE HYPO 21X1.5 SAFETY (NEEDLE) IMPLANT
NEEDLE HYPO 22GX1.5 SAFETY (NEEDLE) ×3 IMPLANT
NS IRRIG 1000ML POUR BTL (IV SOLUTION) ×3 IMPLANT
OIL CARTRIDGE MAESTRO DRILL (MISCELLANEOUS) ×3
PACK LAMINECTOMY NEURO (CUSTOM PROCEDURE TRAY) ×3 IMPLANT
PAD ARMBOARD 7.5X6 YLW CONV (MISCELLANEOUS) ×9 IMPLANT
PATTIES SURGICAL .5 X1 (DISPOSABLE) IMPLANT
ROD REVERE 6.35 40MM (Rod) ×6 IMPLANT
SCREW REVERE 6.35 75X55MM (Screw) ×12 IMPLANT
SPONGE LAP 4X18 RFD (DISPOSABLE) IMPLANT
SPONGE NEURO XRAY DETECT 1X3 (DISPOSABLE) IMPLANT
SPONGE SURGIFOAM ABS GEL 100 (HEMOSTASIS) ×3 IMPLANT
STRIP BIOACTIVE 10CC 25X50X8 (Miscellaneous) ×6 IMPLANT
STRIP CLOSURE SKIN 1/2X4 (GAUZE/BANDAGES/DRESSINGS) ×2 IMPLANT
SUT VIC AB 1 CT1 18XBRD ANBCTR (SUTURE) ×2 IMPLANT
SUT VIC AB 1 CT1 8-18 (SUTURE) ×4
SUT VIC AB 2-0 CP2 18 (SUTURE) ×6 IMPLANT
SYR 20CC LL (SYRINGE) IMPLANT
TOWEL GREEN STERILE (TOWEL DISPOSABLE) ×3 IMPLANT
TOWEL GREEN STERILE FF (TOWEL DISPOSABLE) ×3 IMPLANT
TRAY FOLEY MTR SLVR 16FR STAT (SET/KITS/TRAYS/PACK) ×3 IMPLANT
WATER STERILE IRR 1000ML POUR (IV SOLUTION) ×3 IMPLANT

## 2018-06-27 NOTE — Anesthesia Procedure Notes (Signed)
Procedure Name: Intubation Date/Time: 06/27/2018 7:45 AM Performed by: Moshe Salisbury, CRNA Pre-anesthesia Checklist: Patient identified, Emergency Drugs available, Suction available and Patient being monitored Patient Re-evaluated:Patient Re-evaluated prior to induction Oxygen Delivery Method: Circle System Utilized Preoxygenation: Pre-oxygenation with 100% oxygen Induction Type: IV induction Ventilation: Mask ventilation without difficulty Laryngoscope Size: Mac and 3 Grade View: Grade III Tube type: Oral Tube size: 7.5 mm Number of attempts: 1 Airway Equipment and Method: Stylet Placement Confirmation: ETT inserted through vocal cords under direct vision,  positive ETCO2 and breath sounds checked- equal and bilateral Secured at: 21 cm Tube secured with: Tape Dental Injury: Teeth and Oropharynx as per pre-operative assessment

## 2018-06-27 NOTE — Op Note (Signed)
Brief history: The patient is a 62 year old white female who is complained of back, buttock and leg pain consistent with neurogenic claudication.  She has failed medical management.  She was worked up with a lumbar MRI which demonstrated an L4-5 spinal listhesis, facet arthropathy and spinal stenosis.  I discussed the various treatment options with the patient including surgery.  She has decided to proceed with an L4-5 decompression, instrumentation and fusion after weighing the risks, benefits and alternatives of surgery.  Preoperative diagnosis: L4-5 spondylolisthesis, degenerative disc disease, spinal stenosis compressing both the L4 and the L5 nerve roots; lumbago; lumbar radiculopathy, neurogenic claudication  Postoperative diagnosis: The same  Procedure: Bilateral L4-5 laminotomy/foraminotomies to decompress the bilateral L4 and L5 nerve roots(the work required to do this was in addition to the work required to do the posterior lumbar interbody fusion because of the patient's spinal stenosis, facet arthropathy. Etc. requiring a wide decompression of the nerve roots.);  L4-5 transforaminal lumbar interbody fusion with local morselized autograft bone and Kinnex graft extender; insertion of interbody prosthesis at L4-5 (globus peek expandable interbody prosthesis); posterior nonsegmental instrumentation from L4 to L5 with globus titanium pedicle screws and rods; posterior lateral arthrodesis at L4-5 with local morselized autograft bone and Kinnex bone graft extender.  Surgeon: Dr. Earle Gell  Asst.: Dr. Venetia Constable  Anesthesia: Gen. endotracheal  Estimated blood loss: 200 cc  Drains: None  Complications: None  Description of procedure: The patient was brought to the operating room by the anesthesia team. General endotracheal anesthesia was induced. The patient was turned to the prone position on the Wilson frame. The patient's lumbosacral region was then prepared with Betadine scrub and  Betadine solution. Sterile drapes were applied.  I then injected the area to be incised with Marcaine with epinephrine solution. I then used the scalpel to make a linear midline incision over the L4-5 interspace. I then used electrocautery to perform a bilateral subperiosteal dissection exposing the spinous process and lamina of L4 and L5. We then obtained intraoperative radiograph to confirm our location. We then inserted the Verstrac retractor to provide exposure.  I began the decompression by using the high speed drill to perform laminotomies at L4-5 bilaterally. We then used the Kerrison punches to widen the laminotomy and removed the ligamentum flavum at L4-5 bilaterally. We used the Kerrison punches to remove the medial facets at L4-5 bilaterally. We performed wide foraminotomies about the bilateral L4 and L5 nerve roots completing the decompression.  We now turned our attention to the posterior lumbar interbody fusion. I used a scalpel to incise the intervertebral disc at L4-5 bilaterally. I then performed a partial intervertebral discectomy at L4-5 bilaterally using the pituitary forceps. We prepared the vertebral endplates at S3-4 bilaterally for the fusion by removing the soft tissues with the curettes. We then used the trial spacers to pick the appropriate sized interbody prosthesis. We prefilled his prosthesis with a combination of local morselized autograft bone that we obtained during the decompression as well as Kinnex bone graft extender. We inserted the prefilled prosthesis into the interspace at L4-5 from the left, we then turned and expanded the prosthesis.. There was a good snug fit of the prosthesis in the interspace. We then filled and the remainder of the intervertebral disc space with local morselized autograft bone and Kinnex. This completed the posterior lumbar interbody arthrodesis.  We now turned attention to the instrumentation. Under fluoroscopic guidance we cannulated the  bilateral L4 and L5 pedicles with the bone probe. We then  removed the bone probe. We then tapped the pedicle with a 6.5 millimeter tap. We then removed the tap. We probed inside the tapped pedicle with a ball probe to rule out cortical breaches. We then inserted a 7.5 x 55 millimeter pedicle screw into the L4 and L5 pedicles bilaterally under fluoroscopic guidance. We then palpated along the medial aspect of the pedicles to rule out cortical breaches. There were none. The nerve roots were not injured. We then connected the unilateral pedicle screws with a lordotic rod. We compressed the construct and secured the rod in place with the caps. We then tightened the caps appropriately. This completed the instrumentation from L4-5 bilaterally.  We now turned our attention to the posterior lateral arthrodesis at L4-5 bilaterally. We used the high-speed drill to decorticate the remainder of the facets, pars, transverse process at L4-5 bilaterally. We then applied a combination of local morselized autograft bone and Kinnex bone graft extender over these decorticated posterior lateral structures. This completed the posterior lateral arthrodesis.  We then obtained hemostasis using bipolar electrocautery. We irrigated the wound out with bacitracin solution. We inspected the thecal sac and nerve roots and noted they were well decompressed. We then removed the retractor. We placed vancomycin powder in the wound. We reapproximated patient's thoracolumbar fascia with interrupted #1 Vicryl suture. We reapproximated patient's subcutaneous tissue with interrupted 2-0 Vicryl suture. The reapproximated patient's skin with Steri-Strips and benzoin. The wound was then coated with bacitracin ointment. A sterile dressing was applied. The drapes were removed. The patient was subsequently returned to the supine position where they were extubated by the anesthesia team. He was then transported to the post anesthesia care unit in stable  condition. All sponge instrument and needle counts were reportedly correct at the end of this case.

## 2018-06-27 NOTE — Progress Notes (Signed)
Patient called nurse to room with concern about her pain medication and also about her not being on a unit where she will be monitored closely as she was given Narcan in PACU due to her being apneic. Nurse informed patient that she is medicated as prescribed and that IV med will be held due to her just given Narcan. Nurse assured patient she will request her move to a more monitored unit. Patient and family pleased.

## 2018-06-27 NOTE — H&P (Signed)
Subjective: The patient is a 62 year old white female who is complained of back, buttock and leg pain consistent with neurogenic claudication.  She has failed medical management.  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  I discussed the various treatment options with her.  She has decided to proceed with surgery.  Past Medical History:  Diagnosis Date  . Anxiety   . Arrhythmia   . Arthritis   . Bulging lumbar disc   . Controlled diabetes mellitus ([place])    Diet controlled  . Cystocele   . DDD (degenerative disc disease), cervical   . Depression   . External hemorrhoids   . Facial basal cell cancer   . Fibromyalgia    Diagnosed in 2010  . Heart attack ([place])    Resulted on EKG -2015  . High cholesterol   . History of kidney stones   . HTN (hypertension)   . Rectocele   . Recurrent UTI   . Sleep apnea    CPAP  . Spleen anomaly    compressed  . SVT (supraventricular tachycardia) ([facility])     Past Surgical History:  Procedure Laterality Date  . BASAL CELL CARCINOMA EXCISION     face  . bladder tack  2004  . CARDIAC CATHETERIZATION    . COSMETIC SURGERY  2014   Lip  . CYSTOSCOPY W/ RETROGRADES Bilateral 10/04/2015   Procedure: CYSTOSCOPY WITH RETROGRADE PYELOGRAM;  Surgeon: Hollice Espy, MD;  Location: [facility] ORS;  Service: Urology;  Laterality: Bilateral;  . Heart ablation    . KIDNEY STONE SURGERY     stents  . VAGINAL HYSTERECTOMY      Allergies  Allergen Reactions  . Ivp Dye [Iodinated Diagnostic Agents] Anaphylaxis and Other (See Comments)    "chest pains and irregular heartbeat"," cannot tolerate intravenous"  . Sulfa Antibiotics Hives and Shortness Of Breath  . Demerol [Meperidine] Other (See Comments)    "seizures"  . Flu Virus Vaccine Other (See Comments)    High fever, flushing, hypertension   . Nsaids Palpitations and Other (See Comments)    "GI problems", after long periods will cause  irregular heart beat   . Prednisone Other (See Comments) and Hypertension    Hypertension, flushing  . Ketorolac Tromethamine Palpitations    Social History   Tobacco Use  . Smoking status: Former Smoker    Packs/day: 0.25    Types: Cigarettes    Last attempt to quit: 2000    Years since quitting: 19.7  . Smokeless tobacco: Never Used  . Tobacco comment: quit 20 years  Substance Use Topics  . Alcohol use: No    Alcohol/week: 0.0 standard drinks    Family History  Problem Relation Age of Onset  . Kidney Stones Father   . Hematuria Father   . Heart failure Father   . Heart failure Mother        Father  . Melanoma Mother   . Diabetes Mother   . Diabetes Sister   . Colon cancer Maternal Aunt   . Breast cancer Maternal Aunt 67       one aunt and one great aunt  . Diabetes Maternal Grandmother   . Ovarian cancer Neg Hx    Prior to Admission medications   Medication Sig Start Date End Date Taking? Authorizing Provider  acetaminophen (TYLENOL) 325 MG tablet Take 650 mg by mouth daily as needed for moderate pain.   Yes [provider]  aspirin EC 81 MG tablet Take 81 mg by mouth daily.   Yes [provider]  atenolol (TENORMIN) 25 MG tablet Take 25 mg by mouth daily. My take a second 25 mg dose as needed for PVC 09/20/14  Yes [provider]  Calcium Polycarbophil (FIBERCON PO) Take 1 Dose by mouth at bedtime.   Yes [provider]  clonazePAM (KLONOPIN) 0.5 MG tablet Take 0.5 mg by mouth 2 (two) times daily as needed for anxiety. 04/30/18  Yes [provider]  cyclobenzaprine (FLEXERIL) 5 MG tablet Take 5-10 mg by mouth 2 (two) times daily as needed for muscle spasms. 06/03/18  Yes [provider]  docusate sodium (COLACE) 250 MG capsule Take 250 mg by mouth daily.   Yes [provider]  estradiol (ESTRACE) 0.1 MG/GM vaginal cream Place 1 Applicatorful vaginally every Thursday.   Yes [provider]  fenofibrate  (TRICOR) 145 MG tablet Take 145 mg by mouth at bedtime.  08/06/14  Yes [provider]  KLOR-CON M20 20 MEQ tablet Take 20 mEq by mouth daily. May take a second 10 meq dose at night as needed for leg cramps 08/06/14  Yes [provider]  losartan (COZAAR) 50 MG tablet Take 50 mg by mouth daily. 09/01/14  Yes [provider]  Magnesium 500 MG CAPS Take 500 mg by mouth daily.    Yes [provider]  Melatonin 5 MG CAPS Take 5-10 mg by mouth at bedtime as needed (sleep).   Yes [provider]  niacin 500 MG tablet Take 500 mg by mouth at bedtime.   Yes [provider]  nortriptyline (PAMELOR) 75 MG capsule Take 75 mg by mouth at bedtime.  09/15/14  Yes [provider]  oxyCODONE (OXY IR/ROXICODONE) 5 MG immediate release tablet Take 5 mg by mouth 3 (three) times daily as needed for severe pain.  10/14/15  Yes [provider]  polyethylene glycol (MIRALAX / GLYCOLAX) packet Take 17 g by mouth daily.   Yes [provider]  rOPINIRole (REQUIP) 0.5 MG tablet Take 0.5 mg by mouth at bedtime as needed (restless legs).  03/25/18  Yes [provider]  temazepam (RESTORIL) 15 MG capsule Take 15 mg by mouth daily as needed for sleep (restless legs).  05/02/18  Yes [provider]  triamterene-hydrochlorothiazide (MAXZIDE-25) 37.5-25 MG per tablet Take 1 tablet by mouth every morning. 08/06/14  Yes [provider]  estradiol (ESTRING) 2 MG vaginal ring Place 2 mg vaginally every 3 (three) months. follow package directions Patient not taking: Reported on 06/14/2018 03/20/18   Defrancesco, Alanda Slim, MD  glucose blood test strip  05/09/13   [provider]     Review of Systems  Positive ROS: As above  All other systems have been reviewed and were otherwise negative with the exception of those mentioned in the HPI and as above.  Objective: Vital signs in last 24 hours: Temp:  [97.7 F (36.5 C)] 97.7  F (36.5 C) (10/03 0650) Pulse Rate:  [60] 60 (10/03 0644) Resp:  [20] 20 (10/03 0644) BP: (110)/(73) 110/73 (10/03 0644) SpO2:  [100 %] 100 % (10/03 0644) Weight:  [90.7 kg] 90.7 kg (10/03 0644) Estimated body mass index is 31.32 kg/m as calculated from the following:   Height as of this encounter: 5\' 7"  (1.702 m).   Weight as of this encounter: 90.7 kg.   General Appearance: Alert Head: Normocephalic, without obvious abnormality, atraumatic Eyes: PERRL,  conjunctiva/corneas clear, EOM's intact,    Ears: Normal  Throat: Normal  Neck: Supple, Back: unremarkable Lungs: Clear to auscultation bilaterally, respirations unlabored Heart: Regular rate and rhythm, no murmur, rub or gallop Abdomen: Soft, non-tender Extremities: Extremities normal, atraumatic, no cyanosis or edema Skin: unremarkable  NEUROLOGIC:   Mental status: alert and oriented,Motor Exam - grossly normal Sensory Exam - grossly normal Reflexes:  Coordination - grossly normal Gait - grossly normal Balance - grossly normal Cranial Nerves: I: smell Not tested  II: visual acuity  OS: Normal  OD: Normal   II: visual fields Full to confrontation  II: pupils Equal, round, reactive to light  III,VII: ptosis None  III,IV,VI: extraocular muscles  Full ROM  V: mastication Normal  V: facial light touch sensation  Normal  V,VII: corneal reflex  Present  VII: facial muscle function - upper  Normal  VII: facial muscle function - lower Normal  VIII: hearing Not tested  IX: soft palate elevation  Normal  IX,X: gag reflex Present  XI: trapezius strength  5/5  XI: sternocleidomastoid strength 5/5  XI: neck flexion strength  5/5  XII: tongue strength  Normal    Data Review Lab Results  Component Value Date   WBC 8.7 06/18/2018   HGB 13.5 06/18/2018   HCT 41.6 06/18/2018   MCV 93.3 06/18/2018   PLT 265 06/18/2018   Lab Results  Component Value Date   NA 139 06/18/2018   K 3.9 06/18/2018   CL 104 06/18/2018    CO2 25 06/18/2018   BUN 21 (H) 06/18/2018   CREATININE 0.99 06/18/2018   GLUCOSE 93 06/18/2018   Lab Results  Component Value Date   INR 1.0 07/07/2014    Assessment/Plan: L4-5 spinal listhesis, spinal stenosis, facet arthropathy, lumbago, lumbar radiculopathy, neurogenic claudication: I have discussed the situation with the patient.  I reviewed her imaging studies with her and pointed out the abnormalities.  We have discussed the various treatment options including surgery.  I have described the surgical treatment option of an L4-5 decompression, instrumentation and fusion.  I have shown her surgical models.  I have given her surgical pamphlet.  We have discussed the risks, benefits, alternatives, expected postoperative course, and likelihood of achieving our goals with surgery.  I have answered all her questions.  She has decided to proceed with surgery.   Ophelia Charter 06/27/2018 7:12 AM

## 2018-06-27 NOTE — Progress Notes (Signed)
Pt c/o pain upon adm to PACU, med by CRNA. She became apneic, desat. 57, put on NRB mask.  Dr R. Fitzgerald at [employer] [facility] given with good effect. Pt more awake, weaned off NRB, maintaining O2 sats on 3 L [place]. Will continue to monitor.

## 2018-06-27 NOTE — Transfer of Care (Signed)
Immediate Anesthesia Transfer of Care Note  Patient: Erin Campos  Procedure(s) Performed: POSTERIOR LUMBAR INTERBODY FUSION, INTERBODY PROSTHESIS, WITH INSTRUMENTATION LUMBAR FOUR - LUMBAR FIVE (N/A Spine Lumbar)  Patient Location: PACU  Anesthesia Type:General  Level of Consciousness: awake, oriented and patient cooperative  Airway & Oxygen Therapy: Patient Spontanous Breathing and Patient connected to nasal cannula oxygen  Post-op Assessment: Report given to RN, Post -op Vital signs reviewed and stable and Patient moving all extremities  Post vital signs: Reviewed and stable  Last Vitals:  Vitals Value Taken Time  BP 140/79 06/27/2018 11:25 AM  Temp    Pulse 72 06/27/2018 11:28 AM  Resp 17 06/27/2018 11:28 AM  SpO2 99 % 06/27/2018 11:28 AM  Vitals shown include unvalidated device data.  Last Pain:  Vitals:   06/27/18 0650  TempSrc: Oral  PainSc:          Complications: No apparent anesthesia complications

## 2018-06-27 NOTE — Anesthesia Postprocedure Evaluation (Signed)
Anesthesia Post Note  Patient: Erin Campos  Procedure(s) Performed: POSTERIOR LUMBAR INTERBODY FUSION, INTERBODY PROSTHESIS, WITH INSTRUMENTATION LUMBAR FOUR - LUMBAR FIVE (N/A Spine Lumbar)     Patient location during evaluation: PACU Anesthesia Type: General Level of consciousness: awake and alert Pain management: pain level controlled Vital Signs Assessment: post-procedure vital signs reviewed and stable Respiratory status: spontaneous breathing, nonlabored ventilation, respiratory function stable and patient connected to nasal cannula oxygen Cardiovascular status: blood pressure returned to baseline and stable Postop Assessment: no apparent nausea or vomiting Anesthetic complications: no    Last Vitals:  Vitals:   06/27/18 1330 06/27/18 1348  BP:  (!) 141/80  Pulse: 67 72  Resp: 19 17  Temp: (!) 36.1 C 36.6 C  SpO2: 94% 99%    Last Pain:  Vitals:   06/27/18 1348  TempSrc: Oral  PainSc:                  Tiajuana Amass

## 2018-06-28 LAB — BASIC METABOLIC PANEL
Anion gap: 8 (ref 5–15)
BUN: 19 mg/dL (ref 6–20)
CO2: 27 mmol/L (ref 22–32)
CREATININE: 1.13 mg/dL — AB (ref 0.44–1.00)
Calcium: 8.5 mg/dL — ABNORMAL LOW (ref 8.9–10.3)
Chloride: 104 mmol/L (ref 98–111)
GFR calc Af Amer: 60 mL/min — ABNORMAL LOW (ref >60)
GFR, EST NON AFRICAN AMERICAN: 52 mL/min — AB (ref >60)
Glucose, Bld: 157 mg/dL — ABNORMAL HIGH (ref 70–99)
POTASSIUM: 3.5 mmol/L (ref 3.5–5.1)
SODIUM: 139 mmol/L (ref 135–145)

## 2018-06-28 LAB — CBC
HCT: 32.5 % — ABNORMAL LOW (ref 36.0–46.0)
Hemoglobin: 10.7 g/dL — ABNORMAL LOW (ref 12.0–15.0)
MCH: 30.4 pg (ref 26.0–34.0)
MCHC: 32.9 g/dL (ref 30.0–36.0)
MCV: 92.3 fL (ref 78.0–100.0)
PLATELETS: 217 10*3/uL (ref 150–400)
RBC: 3.52 MIL/uL — ABNORMAL LOW (ref 3.87–5.11)
RDW: 13 % (ref 11.5–15.5)
WBC: 11.5 10*3/uL — ABNORMAL HIGH (ref 4.0–10.5)

## 2018-06-28 MED FILL — Sodium Chloride IV Soln 0.9%: INTRAVENOUS | Qty: 1000 | Status: AC

## 2018-06-28 MED FILL — Heparin Sodium (Porcine) Inj 1000 Unit/ML: INTRAMUSCULAR | Qty: 30 | Status: AC

## 2018-06-28 NOTE — Progress Notes (Signed)
Transferred patient to [id number]. Report given to [facility]. Patient in no signs of distress at the time of transfer. Vital signs stable. Pts personal belongings with spouse at bedside.

## 2018-06-28 NOTE — Evaluation (Signed)
Occupational Therapy Evaluation Patient Details Name: Erin Campos MRN: [id number] DOB: January 06, 1956 Today's Date: 06/28/2018    History of Present Illness Pt is a 62 y/o female now s/p bil L4-5 laminotomy/foraminotomies and PLIF L4-5. PMHx includes anxiety, depression, fibromyalgia, HTN, SVT, DM, DDD   Clinical Impression   This 62 y/o female presents with the above. At baseline pt is independent with ADLs and functional mobility. Pt presents supine in bed pleasant and willing to participate in therapy. Pt demonstrating room level functional mobility without AD during session. Pt with slower, guarded movements requiring minA for steadying throughout and intermittent use of HHA. Pt currently requires minA for toileting, standing grooming, UB and LB ADLs. Educated both pt/pt's family regarding back precautions, safety and compensatory techniques for completing ADLs while maintaining precautions with pt verbalizing good understanding throughout. Pt eager to work and progress with therapy towards PLOF. Will continue to follow while she remains in acute setting to maximize her overall safety and independence with ADLs and mobility prior to discharge home. Pt reports will return home with spouse/family assist for ADL/iADLs PRN. Will follow.     Follow Up Recommendations  No OT follow up;Supervision/Assistance - 24 hour    Equipment Recommendations  None recommended by OT(pt's DME needs are met)           Precautions / Restrictions Precautions Precautions: Back Precaution Booklet Issued: Yes (comment) Precaution Comments: issued and reviewed with pt/pt family Required Braces or Orthoses: Spinal Brace Spinal Brace: Lumbar corset;Applied in sitting position Restrictions Weight Bearing Restrictions: No      Mobility Bed Mobility Overal bed mobility: Needs Assistance Bed Mobility: Rolling;Sidelying to Sit Rolling: Min assist Sidelying to sit: Min assist       General bed mobility  comments: VCs log roll technique; assist to guide LEs and UB when rolling to the R, use of bed rails, minA for trunk elevation; increased time/effort  Transfers Overall transfer level: Needs assistance Equipment used: None Transfers: Sit to/from Stand Sit to Stand: Min assist         General transfer comment: assist to rise and steady; pt with good hand placement; stood from EOB and toilet    Balance Overall balance assessment: Needs assistance Sitting-balance support: Feet supported Sitting balance-Leahy Scale: Good     Standing balance support: Single extremity supported;No upper extremity supported;During functional activity Standing balance-Leahy Scale: Fair Standing balance comment: minguard assist for static standing; minA for mobility in room                           ADL either performed or assessed with clinical judgement   ADL Overall ADL's : Needs assistance/impaired Eating/Feeding: Independent;Sitting   Grooming: Min guard;Minimal assistance;Standing;Wash/dry hands Grooming Details (indicate cue type and reason): minA progressed to minguard for standing balance Upper Body Bathing: Set up;Sitting   Lower Body Bathing: Min guard;Sit to/from stand Lower Body Bathing Details (indicate cue type and reason): educated on using shower seat initially for task completion at home for increased safety, independence, and increased adherence to back precautions Upper Body Dressing : Minimal assistance;Sitting Upper Body Dressing Details (indicate cue type and reason): minA for brace management; educated pt and family regarding technique for proper donning/doffing Lower Body Dressing: Minimal assistance;Sit to/from stand Lower Body Dressing Details (indicate cue type and reason): pt able to perform figure 4 fairly easily and without pain at surgical site; minguard-minA standing balance Toilet Transfer: Minimal assistance;Ambulation;Comfort height toilet;Grab bars  Toileting- Clothing Manipulation and Hygiene: Minimal assistance;Sit to/from stand Toileting - Clothing Manipulation Details (indicate cue type and reason): assist for gown management; pt better able to perform peri-care using lateral leans (vs standing) while maintaining back precautions     Functional mobility during ADLs: Minimal assistance(occasional use of HHA) General ADL Comments: pt with slow/guarded movements; educated both pt/pt's family regarding back precautions, safety and compensatory techniques for completing ADLs and functional transfers. Will benefit from continued review/practice                         Pertinent Vitals/Pain Pain Assessment: Faces Faces Pain Scale: Hurts little more Pain Location: back at incision site Pain Descriptors / Indicators: Grimacing;Guarding;Sore Pain Intervention(s): Limited activity within patient's tolerance;Monitored during session;Premedicated before session     Hand Dominance     Extremity/Trunk Assessment Upper Extremity Assessment Upper Extremity Assessment: Overall WFL for tasks assessed   Lower Extremity Assessment Lower Extremity Assessment: Defer to PT evaluation   Cervical / Trunk Assessment Cervical / Trunk Assessment: Other exceptions Cervical / Trunk Exceptions: s/p lumbar surgery   Communication Communication Communication: No difficulties   Cognition Arousal/Alertness: Awake/alert Behavior During Therapy: WFL for tasks assessed/performed Overall Cognitive Status: Within Functional Limits for tasks assessed                                                      Home Living Family/patient expects to be discharged to:: Private residence Living Arrangements: Spouse/significant other Available Help at Discharge: Family Type of Home: House Home Access: Stairs to enter Technical brewer of Steps: 1 step onto patio   Home Layout: One level     Bathroom Shower/Tub: Emergency planning/management officer: Handicapped height     Home Equipment: Bedside commode;Shower seat;[place] - 2 wheels          Prior Functioning/Environment Level of Independence: Independent;Independent with assistive device(s)        Comments: occasionally using RW just prior to surgery        OT Problem List: Decreased strength;Decreased range of motion;Impaired balance (sitting and/or standing);Decreased knowledge of precautions;Pain;Decreased activity tolerance;Decreased knowledge of use of DME or AE      OT Treatment/Interventions: Self-care/ADL training;Therapeutic exercise;DME and/or AE instruction;Therapeutic activities;Patient/family education;Balance training    OT Goals(Current goals can be found in the care plan section) Acute Rehab OT Goals Patient Stated Goal: continue progressing towards PLOF of independent OT Goal Formulation: With patient Time For Goal Achievement: 07/12/18 Potential to Achieve Goals: Good  OT Frequency: Min 2X/week   Barriers to D/C:            Co-evaluation              AM-PAC PT "6 Clicks" Daily Activity     Outcome Measure Help from another person eating meals?: None Help from another person taking care of personal grooming?: A Little Help from another person toileting, which includes using toliet, bedpan, or urinal?: A Little Help from another person bathing (including washing, rinsing, drying)?: A Little Help from another person to put on and taking off regular upper body clothing?: A Little Help from another person to put on and taking off regular lower body clothing?: A Little 6 Click Score: 19   End of Session Equipment Utilized During Treatment: Gait belt;Back  brace Nurse Communication: Mobility status  Activity Tolerance: Patient tolerated treatment well Patient left: in chair;with bed alarm set;with family/visitor present;with call bell/phone within reach(seated EOB)  OT Visit Diagnosis: Other abnormalities of gait  and mobility (R26.89);Unsteadiness on feet (R26.81)                Time: 8358-4465 OT Time Calculation (min): 36 min Charges:  OT General Charges $OT Visit: 1 Visit OT Evaluation $OT Eval Moderate Complexity: 1 Mod OT Treatments $Self Care/Home Management : 8-22 mins  Erin Campos, OT [facility] Pager [phone] Office [phone]   Erin Campos 06/28/2018, 11:36 AM

## 2018-06-28 NOTE — Progress Notes (Signed)
Pt has Home CPAP unit.  Pt stated she did not need assistance.

## 2018-06-28 NOTE — Progress Notes (Signed)
Subjective: The patient is alert and pleasant.  Her husband is at the bedside.  Her back is appropriately sore.  Objective: Vital signs in last 24 hours: Temp:  [97 F (36.1 C)-99 F (37.2 C)] 98.4 F (36.9 C) (10/03 2223) Pulse Rate:  [64-82] 76 (10/03 2223) Resp:  [12-19] 16 (10/03 2223) BP: (116-144)/(61-91) 116/61 (10/03 2223) SpO2:  [94 %-100 %] 98 % (10/03 2223) Estimated body mass index is 31.32 kg/m as calculated from the following:   Height as of this encounter: 5\' 7"  (1.702 m).   Weight as of this encounter: 90.7 kg.   Intake/Output from previous day: 10/03 0701 - 10/04 0700 In: 6387 [P.O.:100; I.V.:1400] Out: 1100 [Urine:900; Blood:200] Intake/Output this shift: No intake/output data recorded.  Physical exam the patient is alert and oriented x3.  Her strength is grossly normal.  She looks well.  Lab Results: Recent Labs    06/28/18 0408  WBC 11.5*  HGB 10.7*  HCT 32.5*  PLT 217   BMET Recent Labs    06/28/18 0408  NA 139  K 3.5  CL 104  CO2 27  GLUCOSE 157*  BUN 19  CREATININE 1.13*  CALCIUM 8.5*    Studies/Results: Dg Lumbar Spine 2-3 Views  Result Date: 06/27/2018 CLINICAL DATA:  PLIF L4-L5 EXAM: LUMBAR SPINE - 2-3 VIEW; DG C-ARM 61-120 MIN COMPARISON:  Lumbar spine radiographs 06/27/2018, MRI lumbar spine 04/30/2018 FLUOROSCOPY TIME:  0 minutes 28 seconds Images submitted: 5 FINDINGS: MRI labeled with 5 lumbar vertebra. Diffuse osseous demineralization. Images demonstrate placement of BILATERAL pedicle screws at L4 and L5 with a disc prosthesis at the L4-L5 disc space. No gross fracture or subluxation. IMPRESSION: Intraoperative images during posterior fusion L4-L5. Electronically Signed   By: Lavonia Dana M.D.   On: 06/27/2018 11:19   Dg Lumbar Spine 1 View  Result Date: 06/27/2018 CLINICAL DATA:  Localization image for L4-5 PLIF. EXAM: LUMBAR SPINE - 1 VIEW COMPARISON:  AP and lateral views of the lumbar spine dated April 09, 2018 FINDINGS: The  image is labeled film #1. The metallic trocar projects over the posterior aspect of the inferior articulating facet of L4. This is approximately 2.9 cm posterior to the L4-5 disc space. IMPRESSION: Lateral localization radiograph revealing the metallic trocar to be approximately 3 cm posterior to the L4-5 disc space. Electronically Signed   By: David  Martinique M.D.   On: 06/27/2018 11:02   Dg C-arm 1-60 Min  Result Date: 06/27/2018 CLINICAL DATA:  PLIF L4-L5 EXAM: LUMBAR SPINE - 2-3 VIEW; DG C-ARM 61-120 MIN COMPARISON:  Lumbar spine radiographs 06/27/2018, MRI lumbar spine 04/30/2018 FLUOROSCOPY TIME:  0 minutes 28 seconds Images submitted: 5 FINDINGS: MRI labeled with 5 lumbar vertebra. Diffuse osseous demineralization. Images demonstrate placement of BILATERAL pedicle screws at L4 and L5 with a disc prosthesis at the L4-L5 disc space. No gross fracture or subluxation. IMPRESSION: Intraoperative images during posterior fusion L4-L5. Electronically Signed   By: Lavonia Dana M.D.   On: 06/27/2018 11:19    Assessment/Plan: Postop day #1: The patient is doing well.  She is contemplating going home later on today.  I gave her discharge instructions and answered all their questions.  LOS: 1 day     Ophelia Charter 06/28/2018, 7:05 AM

## 2018-06-28 NOTE — Progress Notes (Signed)
The patient's creatinine is mildly elevated.  I repeat a BMP tomorrow.

## 2018-06-28 NOTE — Consult Note (Signed)
[facility] CM Primary Care Navigator  06/28/2018  THRESIA RAMANATHAN [birth year] [id number]   Met withpatient(RN) and daughter at the bedsidetoidentify possible discharge needs. Patient reports having history of lower back and leg pain which failed medical management thathad led to thisadmission/ surgery. (spinal listhesis with spinal stenosis L4-L5 status post posterior lumbar fusion)   Patient endorsesDr. Thereasa Distance with [facility] as the primary care provider.   [place] in [place] to obtain medications without difficulty.  Patient reports managingher own medications at home straight out of the containers but uses "pillbox"when she travels.  Patient states that she has been driving prior to admission/ surgery but husband Legrand Como) will beproviding transportation toher doctors' appointments after discharge.  Patientlives with husband at home who will serve as her primary caregiver once discharge.  Anticipated plan for discharge ishome per patient.  Patientvoiced understandingto callprimarycareprovider'soffice once shereturnshome,for a post discharge follow-upvisitwithin1- 2 weeksor sooner if needs arise.Patient letter (with PCP's contact number) was provided asa reminder.  Explained topatientaboutTHN CM services available for health management andresourcesat homebut she denies any current needs or concerns at this point. Patient verbalizedunderstandingto seekreferral from primary care provider to [facility] care management ifdeemed necessary and appropriatefor anyservicesin thefuture.  [facility] care management information was provided for futureneeds thatshemay have.  Patienthowever,verbally agreedand optedforEMMIcalls tofollow-up with herrecovery at home.   Referral made for [facility] General calls after discharge.    For additional questions please contact:  Edwena Felty A.  Maleeyah Mccaughey, BSN, RN-BC [facility] PRIMARY CARE Navigator Cell: [phone]

## 2018-06-28 NOTE — Evaluation (Signed)
Physical Therapy Evaluation Patient Details Name: Erin Campos MRN: [id number] DOB: March 04, 1956 Today's Date: 06/28/2018   History of Present Illness  Pt is a 62 y/o female now s/p bil L4-5 laminotomy/foraminotomies and PLIF L4-5. PMHx includes anxiety, depression, fibromyalgia, HTN, SVT, DM, DDD  Clinical Impression  Patient presents with decreased independence with mobility due to weakness, new back precautions, decreased activity tolerance and pain.  She will benefit from skilled PT in the acute setting to allow d/c home with family support.  Likely not to need follow up PT at d/c.     Follow Up Recommendations Supervision/Assistance - 24 hour;No PT follow up    Equipment Recommendations  None recommended by PT    Recommendations for Other Services       Precautions / Restrictions Precautions Precautions: Back Precaution Comments: reviewed verbally with patient Required Braces or Orthoses: Spinal Brace Spinal Brace: Lumbar corset;Applied in sitting position      Mobility  Bed Mobility Overal bed mobility: Needs Assistance Bed Mobility: Rolling;Sidelying to Sit Rolling: Supervision Sidelying to sit: Supervision       General bed mobility comments: increased time, used bed rail, assist for safety and able to follow precautions cues for positioning in bed prior to coming upright  Transfers Overall transfer level: Needs assistance Equipment used: Rolling Way (2 wheeled) Transfers: Sit to/from Stand Sit to Stand: Min assist         General transfer comment: assist for balance and safety  Ambulation/Gait Ambulation/Gait assistance: Min guard Gait Distance (Feet): 140 Feet Assistive device: Rolling [place] (2 wheeled) Gait Pattern/deviations: Step-to pattern;Step-through pattern;Decreased stride length     General Gait Details: slow pace, cues for [place] use, assist for safety  Stairs            Wheelchair Mobility    Modified Rankin (Stroke  Patients Only)       Balance Overall balance assessment: Needs assistance Sitting-balance support: Feet supported Sitting balance-Leahy Scale: Good     Standing balance support: Bilateral upper extremity supported Standing balance-Leahy Scale: Poor Standing balance comment: UE support in standing                             Pertinent Vitals/Pain Pain Assessment: 0-10 Pain Score: 2  Pain Location: back at incision site Pain Descriptors / Indicators: Sore;Spasm Pain Intervention(s): Monitored during session;Repositioned;Premedicated before session    Home Living Family/patient expects to be discharged to:: Private residence Living Arrangements: Spouse/significant other Available Help at Discharge: Family Type of Home: House Home Access: Stairs to enter   [employer] of Steps: 1 step onto patio Home Layout: One level Home Equipment: Bedside commode;Shower seat;[place] - 2 wheels      Prior Function Level of Independence: Independent;Independent with assistive device(s)         Comments: occasionally using RW just prior to surgery     Hand Dominance        Extremity/Trunk Assessment        Lower Extremity Assessment Lower Extremity Assessment: RLE deficits/detail;LLE deficits/detail RLE Deficits / Details: AROM WFL, strength grossly 4/5 knee extension 3/5 hip flexion with pain RLE Sensation: WNL LLE Deficits / Details: AROM WFL, strength grossly 4/5 knee extension 3/5 hip flexion with pain LLE Sensation: WNL       Communication   Communication: No difficulties  Cognition Arousal/Alertness: Awake/alert Behavior During Therapy: WFL for tasks assessed/performed Overall Cognitive Status: Within Functional Limits for tasks assessed  General Comments General comments (skin integrity, edema, etc.): family in room; report she will ride home in SUV style vehicle    Exercises      Assessment/Plan    PT Assessment Patient needs continued PT services  PT Problem List Decreased mobility;Decreased safety awareness;Decreased activity tolerance;Decreased balance;Pain;Decreased knowledge of use of DME;Decreased knowledge of precautions       PT Treatment Interventions DME instruction;Therapeutic activities;Therapeutic exercise;Gait training;Functional mobility training;Balance training;Patient/family education    PT Goals (Current goals can be found in the Care Plan section)  Acute Rehab PT Goals Patient Stated Goal: to go home PT Goal Formulation: With patient/family Time For Goal Achievement: 07/05/18 Potential to Achieve Goals: Good    Frequency Min 5X/week   Barriers to discharge        Co-evaluation               AM-PAC PT "6 Clicks" Daily Activity  Outcome Measure Difficulty turning over in bed (including adjusting bedclothes, sheets and blankets)?: Unable Difficulty moving from lying on back to sitting on the side of the bed? : Unable Difficulty sitting down on and standing up from a chair with arms (e.g., wheelchair, bedside commode, etc,.)?: Unable Help needed moving to and from a bed to chair (including a wheelchair)?: A Little Help needed walking in hospital room?: A Little Help needed climbing 3-5 steps with a railing? : A Little 6 Click Score: 12    End of Session Equipment Utilized During Treatment: Back brace Activity Tolerance: Patient tolerated treatment well Patient left: with call bell/phone within reach;in chair;with family/visitor present   PT Visit Diagnosis: Other abnormalities of gait and mobility (R26.89);Difficulty in walking, not elsewhere classified (R26.2)    Time: 1420-1446 PT Time Calculation (min) (ACUTE ONLY): 26 min   Charges:   PT Evaluation $PT Eval Low Complexity: 1 Low PT Treatments $Gait Training: 8-22 mins        Magda Kiel, [place] Acute Rehabilitation Services [phone] 06/28/2018   Reginia Naas 06/28/2018, 3:14 PM

## 2018-06-29 LAB — CBC WITH DIFFERENTIAL/PLATELET
Abs Immature Granulocytes: 0.1 10*3/uL (ref 0.0–0.1)
BASOS ABS: 0.1 10*3/uL (ref 0.0–0.1)
Basophils Relative: 0 %
EOS PCT: 0 %
Eosinophils Absolute: 0 10*3/uL (ref 0.0–0.7)
HCT: 34.6 % — ABNORMAL LOW (ref 36.0–46.0)
HEMOGLOBIN: 11.5 g/dL — AB (ref 12.0–15.0)
Immature Granulocytes: 1 %
LYMPHS ABS: 1.5 10*3/uL (ref 0.7–4.0)
LYMPHS PCT: 10 %
MCH: 30.3 pg (ref 26.0–34.0)
MCHC: 33.2 g/dL (ref 30.0–36.0)
MCV: 91.3 fL (ref 78.0–100.0)
MONO ABS: 1.3 10*3/uL — AB (ref 0.1–1.0)
Monocytes Relative: 9 %
Neutro Abs: 12.4 10*3/uL — ABNORMAL HIGH (ref 1.7–7.7)
Neutrophils Relative %: 80 %
Platelets: 230 10*3/uL (ref 150–400)
RBC: 3.79 MIL/uL — ABNORMAL LOW (ref 3.87–5.11)
RDW: 13 % (ref 11.5–15.5)
WBC: 15.4 10*3/uL — AB (ref 4.0–10.5)

## 2018-06-29 LAB — GLUCOSE, CAPILLARY: Glucose-Capillary: 127 mg/dL — ABNORMAL HIGH (ref 70–99)

## 2018-06-29 MED ORDER — SODIUM CHLORIDE 0.9 % IV BOLUS
500.0000 mL | Freq: Once | INTRAVENOUS | Status: AC
  Administered 2018-06-29: 500 mL via INTRAVENOUS

## 2018-06-29 NOTE — Plan of Care (Signed)
  Problem: Activity: Goal: Ability to tolerate increased activity will improve Outcome: Not Progressing   Problem: Bowel/Gastric: Goal: Gastrointestinal status for postoperative course will improve Outcome: Not Progressing   Problem: Clinical Measurements: Goal: Diagnostic test results will improve Outcome: Not Progressing   Problem: Pain Management: Goal: Pain level will decrease Outcome: Not Progressing

## 2018-06-29 NOTE — Progress Notes (Signed)
Inpatient Rehabilitation  Per PT request, patient was screened by Casey Fye for appropriateness for an Inpatient Acute Rehab consult.  At this time we are recommending an Inpatient Rehab consult.  Please order if you are agreeable.    Erin Campos, M.A., CCC/SLP Admission Coordinator  [facility]  Cell [phone]  

## 2018-06-29 NOTE — Progress Notes (Signed)
RN received call from [facility] at [facility] that the patient's heart monitor had been disconnected. RN immediately went to patient's room. Upon arrival patient was on the bedside commode with assistance from the patient's husband, who at 2315 fell in the patient's room going to the bathroom, hit his head and refused to be seen in the ED). RN had educated and reinforced on multiple occasions that if the patient needed to use the [facility] or go to the bathroom that the patient or spouse MUST press the call bell for assistance. RN did not want patient's spouse involved in ambulating patient period. Patient was NOT permitted to ambulate without assistance from a team member. As RN was trying to reenforce this protocol patient began to get weaker and could not get off the bedside commode stating "she could not feel her legs". As RN was trying to help patient off bedside commode patient became limp and unresponsive. RN called for back up and reconnected patient's pulse ox and cardiac monitoring. Patient would not react to painful stimuli. Patient BP dropped to 100/60, HR elevated to 120's was diaphoretic with respirations in the 30's. It took 4 team members to lift her off the bedside commode and back to bed. The abrupt movement back into bed aroused the patient briefly, passed out again but was responsive to painful stimuli. Patient was oriented but still quite drowsy within 5 minutes of getting back into bed. Provider on call ordered a 12 lead EKG which showed NSR. Patient tachy with a softer BP than baseline and still drowsy at shift change but A&Ox4. Dayshift RN made aware of all of nights events.

## 2018-06-29 NOTE — Progress Notes (Signed)
Occupational Therapy Treatment Patient Details Name: Erin Campos MRN: [id number] DOB: 08/29/1956 Today's Date: 06/29/2018    History of present illness Pt is a 62 y/o female now s/p bil L4-5 laminotomy/foraminotomies and PLIF L4-5. PMHx includes anxiety, depression, fibromyalgia, HTN, SVT, DM, DDD   OT comments  Patient presents with L hip/LE pain this afternoon, hypotensive BP, and decreased activity tolerance.  Requires min assist for bed mobility and bed to recliner transfers, min assist for LB dressing.  Good recall of back precautions, requires cueing adhere to functionally.  Supine BP 114/53, seated BP 106/69, standing 97/65, standing x 3 min 91/70, seated after a few min 100/49; asymptomatic throughout. Due to pain and mobility today, updated dc recommendations to CIR (pt also reports she will need to take care of her husband at dc) in order to optimize independence with ADLs and mobility.  Will continue to follow.      Follow Up Recommendations  CIR    Equipment Recommendations  None recommended by OT    Recommendations for Other Services Rehab consult    Precautions / Restrictions Precautions Precautions: Back Required Braces or Orthoses: Spinal Brace Spinal Brace: Lumbar corset;Applied in sitting position Restrictions Weight Bearing Restrictions: No       Mobility Bed Mobility Overal bed mobility: Needs Assistance Bed Mobility: Rolling;Sidelying to Sit Rolling: Min assist Sidelying to sit: Min assist       General bed mobility comments: min assist for log rolling technique, pt progressing legs towards EOB before completing log roll, min assist to support trunk into sitting   Transfers Overall transfer level: Needs assistance Equipment used: Rolling [place] (2 wheeled) Transfers: Sit to/from Stand Sit to Stand: Min assist         General transfer comment: min assist to support into standing, able to stand on first attempt this afternoon; cueing for hand  placement with increased time and effort     Balance Overall balance assessment: Needs assistance Sitting-balance support: Feet supported;No upper extremity supported Sitting balance-Leahy Scale: Fair Sitting balance - Comments: no UE support required in recliner, 1 UE at EOB    Standing balance support: Bilateral upper extremity supported Standing balance-Leahy Scale: Poor Standing balance comment: reliant on external and RW support                           ADL either performed or assessed with clinical judgement   ADL Overall ADL's : Needs assistance/impaired                     Lower Body Dressing: Minimal assistance;Cueing for back precautions;Sit to/from stand Lower Body Dressing Details (indicate cue type and reason): seated supported and able to perform figure 4 technique with increased effort but no increased pain; min assist when standing for balance Toilet Transfer: Minimal assistance;Stand-pivot;Cueing for safety;RW(simulated to recliner) Toilet Transfer Details (indicate cue type and reason): cueing for hand placement and safety         Functional mobility during ADLs: Minimal assistance;Rolling [place] General ADL Comments: pt with slow guarded movement, limited by pain in L LE today; hypotensive BP.      Vision   Vision Assessment?: No apparent visual deficits   Perception     Praxis      Cognition Arousal/Alertness: Awake/alert Behavior During Therapy: Anxious Overall Cognitive Status: Within Functional Limits for tasks assessed  Exercises     Shoulder Instructions       General Comments pt educated on upright positioning, sitting in recliner and back precautions     Pertinent Vitals/ Pain       Pain Assessment: Faces Faces Pain Scale: Hurts whole lot Pain Location: left leg from buttocks to foot Pain Descriptors / Indicators: Grimacing;Guarding Pain Intervention(s):  Limited activity within patient's tolerance;Repositioned;Monitored during session;Heat applied  Home Living                                          Prior Functioning/Environment              Frequency  Min 2X/week        Progress Toward Goals  OT Goals(current goals can now be found in the care plan section)  Progress towards OT goals: Progressing toward goals  Acute Rehab OT Goals Patient Stated Goal: decrease left leg radicular pain OT Goal Formulation: With patient Time For Goal Achievement: 07/12/18 Potential to Achieve Goals: Good  Plan Discharge plan needs to be updated;Frequency remains appropriate    Co-evaluation                 AM-PAC PT "6 Clicks" Daily Activity     Outcome Measure   Help from another person eating meals?: None Help from another person taking care of personal grooming?: A Little Help from another person toileting, which includes using toliet, bedpan, or urinal?: A Little Help from another person bathing (including washing, rinsing, drying)?: A Little Help from another person to put on and taking off regular upper body clothing?: A Little Help from another person to put on and taking off regular lower body clothing?: A Little 6 Click Score: 19    End of Session Equipment Utilized During Treatment: Gait belt;Back brace  OT Visit Diagnosis: Other abnormalities of gait and mobility (R26.89);Unsteadiness on feet (R26.81)   Activity Tolerance Patient tolerated treatment well   Patient Left in chair;with call bell/phone within reach;with family/visitor present   Nurse Communication Mobility status;Other (comment)(BP)        Time: 0263-7858 OT Time Calculation (min): 34 min  Charges: OT General Charges $OT Visit: 1 Visit OT Treatments $Self Care/Home Management : 23-37 mins  Delight Stare, [place] Pager [phone] Office [phone]    Delight Stare 06/29/2018, 3:24  PM

## 2018-06-29 NOTE — Progress Notes (Signed)
Patient ID: Erin Campos, female   DOB: [birth year], 62 y.o.   MRN: [id number] Patient doing much better leg pain improved from preop.  Back pain manageable had a syncopal episode earlier this morning.  Temperature 100 heart rate 101 blood pressure 117/66   strength 5 out of 5 wound clean dry and intact  Episode of vasovagal syncope.  Will check CBC give her fluid bolus blast with physical and Occupational Therapy.

## 2018-06-29 NOTE — Progress Notes (Signed)
Physical Therapy Treatment Patient Details Name: Erin Campos MRN: [id number] DOB: [birth year] Today's Date: 06/29/2018    History of Present Illness Pt is a 62 y/o female now s/p bil L4-5 laminotomy/foraminotomies and PLIF L4-5. PMHx includes anxiety, depression, fibromyalgia, HTN, SVT, DM, DDD    PT Comments    Pt with significant regression of mobility today.  She was only able to walk ~3' with RW and mod assist chair following closely.  She is reporting significant L LE pain.  Serial vitals taken due to syncopal event this AM and her BPs are soft.  Pt lightheaded in standing.  See vitals flow sheet for details.  PT changing recommendation to CIR if she qualifies as she has taken a significant step backwards in her mobility.  PT will continue to follow acutely for safe mobility progression  Follow Up Recommendations  CIR     Equipment Recommendations  None recommended by PT    Recommendations for Other Services Rehab consult     Precautions / Restrictions Precautions Precautions: Back Required Braces or Orthoses: Spinal Brace Spinal Brace: Lumbar corset;Applied in sitting position    Mobility  Bed Mobility Overal bed mobility: Needs Assistance Bed Mobility: Rolling;Sidelying to Sit Rolling: Min assist Sidelying to sit: Min assist       General bed mobility comments: Min assist to help pt roll to her right side completely before progressing bil legs over EOB, separate support needed for trunk to come to sitting.   Transfers Overall transfer level: Needs assistance Equipment used: Rolling [place] (2 wheeled) Transfers: Sit to/from Stand Sit to Stand: Mod assist;From elevated surface         General transfer comment: Mod assist to support trunk to come to standing EOB with 4 attempts to successfully stand up.   Ambulation/Gait Ambulation/Gait assistance: Mod assist;+2 safety/equipment Gait Distance (Feet): 3 Feet Assistive device: Rolling [place] (2  wheeled) Gait Pattern/deviations: Step-to pattern;Antalgic     General Gait Details: Pt only able to walk ~3 ' with RW due to left leg pain with WB, pt also lightheaded.  BPs soft          Balance Overall balance assessment: Needs assistance Sitting-balance support: Feet supported;Bilateral upper extremity supported Sitting balance-Leahy Scale: Fair Sitting balance - Comments: Pt needs bil hands to unweight left hip EOB, leaning to the right.  Postural control: Right lateral lean Standing balance support: Bilateral upper extremity supported Standing balance-Leahy Scale: Poor Standing balance comment: needs external assit from RW and therapist.                             Cognition Arousal/Alertness: Awake/alert Behavior During Therapy: Restless;Anxious;Impulsive Overall Cognitive Status: Within Functional Limits for tasks assessed                                           General Comments General comments (skin integrity, edema, etc.): Educated pt on nerve flossing activities (ankle pumps, gentle knee bends in supine or gentle LAQs in sitting EOB.       Pertinent Vitals/Pain Pain Assessment: Faces Faces Pain Scale: Hurts worst Pain Location: left leg from buttocks to foot Pain Descriptors / Indicators: Grimacing;Guarding Pain Intervention(s): Limited activity within patient's tolerance;Monitored during session;Repositioned;RN gave pain meds during session           PT Goals (current goals can  now be found in the care plan section) Acute Rehab PT Goals Patient Stated Goal: decrease left leg radicular pain Progress towards PT goals: Not progressing toward goals - comment(increased L LE pain today)    Frequency    Min 5X/week      PT Plan Discharge plan needs to be updated       AM-PAC PT "6 Clicks" Daily Activity  Outcome Measure  Difficulty turning over in bed (including adjusting bedclothes, sheets and blankets)?:  Unable Difficulty moving from lying on back to sitting on the side of the bed? : Unable Difficulty sitting down on and standing up from a chair with arms (e.g., wheelchair, bedside commode, etc,.)?: Unable Help needed moving to and from a bed to chair (including a wheelchair)?: A Lot Help needed walking in hospital room?: A Lot Help needed climbing 3-5 steps with a railing? : Total 6 Click Score: 8    End of Session Equipment Utilized During Treatment: Back brace;Gait belt Activity Tolerance: Patient limited by pain Patient left: in chair;with call bell/phone within reach;with family/visitor present;with chair alarm set Nurse Communication: Mobility status PT Visit Diagnosis: Muscle weakness (generalized) (M62.81);Difficulty in walking, not elsewhere classified (R26.2);Pain;Other symptoms and signs involving the nervous system (R29.898) Pain - Right/Left: Left Pain - part of body: Leg     Time: 1470-9295 PT Time Calculation (min) (ACUTE ONLY): 39 min  Charges:  $Therapeutic Activity: 38-52 mins            Jamice Carreno B. Xin Klawitter, PT, DPT  Acute Rehabilitation [phone] pager #[phone] office            06/29/2018, 11:07 AM

## 2018-06-30 ENCOUNTER — Inpatient Hospital Stay (HOSPITAL_COMMUNITY): Payer: PPO

## 2018-06-30 LAB — URINALYSIS, ROUTINE W REFLEX MICROSCOPIC
Bilirubin Urine: NEGATIVE
Glucose, UA: NEGATIVE mg/dL
Ketones, ur: NEGATIVE mg/dL
Leukocytes, UA: NEGATIVE
Nitrite: NEGATIVE
Protein, ur: NEGATIVE mg/dL
SPECIFIC GRAVITY, URINE: 1.016 (ref 1.005–1.030)
pH: 7 (ref 5.0–8.0)

## 2018-06-30 NOTE — Progress Notes (Signed)
Physical Therapy Treatment Patient Details Name: Erin Campos MRN: [id number] DOB: 08/12/1956 Today's Date: 06/30/2018    History of Present Illness Pt is a 62 y/o female now s/p bil L4-5 laminotomy/foraminotomies and PLIF L4-5. PMHx includes anxiety, depression, fibromyalgia, HTN, SVT, DM, DDD    PT Comments    Pt with 10/10 L LE pain this date inhibiting OOB mobility. Attempted to stand and transfer to [facility] per patient request however s/p 10 tries pt unable due to pain. Pt reports she feels like her hamstring is being pulled in half when she stands however she can do complete L knee extension in sitting without difficulty. Per RN pt is to go for CT scan today. PT to return as able as appropriate.    Follow Up Recommendations  CIR     Equipment Recommendations  None recommended by PT    Recommendations for Other Services Rehab consult     Precautions / Restrictions Precautions Precautions: Back Precaution Booklet Issued: Yes (comment) Precaution Comments: reviewed verbally with patient Required Braces or Orthoses: Spinal Brace Spinal Brace: Lumbar corset;Applied in sitting position Restrictions Weight Bearing Restrictions: No    Mobility  Bed Mobility Overal bed mobility: Needs Assistance Bed Mobility: Rolling;Sidelying to Sit;Sit to Sidelying Rolling: Min assist Sidelying to sit: Min assist     Sit to sidelying: Min assist General bed mobility comments: significant increase in time, verbal cues for precautions, pt able to bring LES up onto bed, pt able to push with R ue into sitting  Transfers Overall transfer level: Needs assistance               General transfer comment: attempted to stand  8 times however pt unable to tolerate due to pain in L LE. when asked how it feels pt states "it feels like my hamstring is this short (about an inch) and when i stand it's being ripped apart" pt instructed to place a lot of weight through the R LE to push up. Pt  tearful and in pain and asked to lay down despite encouragement  Ambulation/Gait                 Stairs             Wheelchair Mobility    Modified Rankin (Stroke Patients Only)       Balance Overall balance assessment: Needs assistance Sitting-balance support: Feet supported;Bilateral upper extremity supported Sitting balance-Leahy Scale: Fair Sitting balance - Comments: pt with increased pain in back and L LE without UE support       Standing balance comment: unable to stand today                            Cognition Arousal/Alertness: Awake/alert Behavior During Therapy: Anxious Overall Cognitive Status: Within Functional Limits for tasks assessed                                 General Comments: pt very self limiting, asked to use the bathroom but then unable to get up and wants to lay down and use bed pan, pt educated on why it was important to get up to [facility]      Exercises      General Comments General comments (skin integrity, edema, etc.): pt re-educated on back precautions      Pertinent Vitals/Pain      Home Living  Prior Function            PT Goals (current goals can now be found in the care plan section) Acute Rehab PT Goals Patient Stated Goal: decrease left leg radicular pain Progress towards PT goals: Not progressing toward goals - comment    Frequency    Min 5X/week      PT Plan Discharge plan needs to be updated    Co-evaluation              AM-PAC PT "6 Clicks" Daily Activity  Outcome Measure  Difficulty turning over in bed (including adjusting bedclothes, sheets and blankets)?: Unable Difficulty moving from lying on back to sitting on the side of the bed? : Unable Difficulty sitting down on and standing up from a chair with arms (e.g., wheelchair, bedside commode, etc,.)?: Unable Help needed moving to and from a bed to chair (including a wheelchair)?: A  Lot Help needed walking in hospital room?: A Lot Help needed climbing 3-5 steps with a railing? : Total 6 Click Score: 8    End of Session Equipment Utilized During Treatment: Back brace;Gait belt Activity Tolerance: Patient limited by pain Patient left: in bed;with call bell/phone within reach;with family/visitor present Nurse Communication: Mobility status PT Visit Diagnosis: Muscle weakness (generalized) (M62.81);Difficulty in walking, not elsewhere classified (R26.2);Pain;Other symptoms and signs involving the nervous system (R29.898) Pain - Right/Left: Left Pain - part of body: Leg     Time: 9735-3299 PT Time Calculation (min) (ACUTE ONLY): 18 min  Charges:  $Therapeutic Activity: 8-22 mins                     Kittie Plater, PT, DPT Acute Rehabilitation Services Pager #: [phone] Office #: [phone]    Berline Lopes 06/30/2018, 12:07 PM

## 2018-06-30 NOTE — Progress Notes (Signed)
Postop day 3.  Patient complains of worsening left lower extremity radicular pain.  Pain radiates into her left buttocks posterior thigh and lateral leg.  No weakness.  She does have some numbness on the disposition of her pain.  She reports her back pain is minimal.  Postoperative radicular pain.  Plan CT scan lumbar spine to rule out any type of hardware issue.  Continue current management otherwise.

## 2018-06-30 NOTE — Progress Notes (Signed)
Pt has home CPAP unit.  Pt doesn't need assistance

## 2018-07-01 DIAGNOSIS — E669 Obesity, unspecified: Secondary | ICD-10-CM

## 2018-07-01 DIAGNOSIS — G8929 Other chronic pain: Secondary | ICD-10-CM

## 2018-07-01 DIAGNOSIS — D72829 Elevated white blood cell count, unspecified: Secondary | ICD-10-CM

## 2018-07-01 DIAGNOSIS — I471 Supraventricular tachycardia: Secondary | ICD-10-CM

## 2018-07-01 DIAGNOSIS — Z419 Encounter for procedure for purposes other than remedying health state, unspecified: Secondary | ICD-10-CM

## 2018-07-01 DIAGNOSIS — F411 Generalized anxiety disorder: Secondary | ICD-10-CM

## 2018-07-01 DIAGNOSIS — E1169 Type 2 diabetes mellitus with other specified complication: Secondary | ICD-10-CM

## 2018-07-01 DIAGNOSIS — M5442 Lumbago with sciatica, left side: Secondary | ICD-10-CM

## 2018-07-01 DIAGNOSIS — G8918 Other acute postprocedural pain: Secondary | ICD-10-CM

## 2018-07-01 DIAGNOSIS — M4316 Spondylolisthesis, lumbar region: Secondary | ICD-10-CM

## 2018-07-01 MED ORDER — ASPIRIN 325 MG PO TABS
325.0000 mg | ORAL_TABLET | Freq: Four times a day (QID) | ORAL | Status: DC | PRN
  Administered 2018-07-01: 325 mg via ORAL
  Filled 2018-07-01: qty 1

## 2018-07-01 MED ORDER — BISACODYL 10 MG RE SUPP
10.0000 mg | Freq: Every day | RECTAL | Status: DC | PRN
  Administered 2018-07-01: 10 mg via RECTAL
  Filled 2018-07-01: qty 1

## 2018-07-01 NOTE — Progress Notes (Signed)
Inpatient Rehabilitation Admissions Coordinator  I met with patient at bedside with her spouse, pastor and pastor's wife. She does not wish to discuss any rehab besides d/c home. We will sign off at this time.  Danne Baxter, RN, MSN Rehab Admissions Coordinator [phone] 07/01/2018 4:14 PM

## 2018-07-01 NOTE — Consult Note (Addendum)
Physical Medicine and Rehabilitation Consult  Reason for Consult: Lumbar stenosis Referring Physician: Dr. Arnoldo Morale.    HPI: Erin Campos is a 62 y.o. female with history of SVT, T2DM-diet controlled, OSA, back pain radiating to buttock and leg due to neurogenic claudication and work-up revealed L4-5 spondilolisthesis with spinal stenosis.  She elected to undergo bilateral L4-5 laminotomy/ foraminotomy to decompress L4 and L5 nerve root and required L4-L5 arthrodesis by Dr. Arnoldo Morale on 06/27/2018.  She was doing well with therapy until 10/05 when patient had a vasovagal episode on bedside commode with resultant weakness.  Currently limited by significant pain radiating to  LLE, anxiety impulsiveness and poor safety awareness.  CT lumbar spine done for work up and showed stable hardware. Examined and discussed with Neurosurg.  CIR recommended due to functional deficits.  Review of Systems  Gastrointestinal: Positive for constipation.  Musculoskeletal: Positive for back pain and joint pain.  Neurological: Positive for sensory change and focal weakness.  All other systems reviewed and are negative.     Past Medical History:  Diagnosis Date  . Anxiety   . Arrhythmia   . Arthritis   . Bulging lumbar disc   . Controlled diabetes mellitus ([place])    Diet controlled  . Cystocele   . DDD (degenerative disc disease), cervical   . Depression   . External hemorrhoids   . Facial basal cell cancer   . Fibromyalgia    Diagnosed in 2010  . Heart attack ([place])    Resulted on EKG -2015  . High cholesterol   . History of kidney stones   . HTN (hypertension)   . Rectocele   . Recurrent UTI   . Sleep apnea    CPAP  . Spleen anomaly    compressed  . SVT (supraventricular tachycardia) ([facility])     Past Surgical History:  Procedure Laterality Date  . BASAL CELL CARCINOMA EXCISION     face  . bladder tack  2004  . CARDIAC CATHETERIZATION    . COSMETIC SURGERY  2014   Lip  .  CYSTOSCOPY W/ RETROGRADES Bilateral 10/04/2015   Procedure: CYSTOSCOPY WITH RETROGRADE PYELOGRAM;  Surgeon: Hollice Espy, MD;  Location: [facility] ORS;  Service: Urology;  Laterality: Bilateral;  . Heart ablation    . KIDNEY STONE SURGERY     stents  . VAGINAL HYSTERECTOMY      Family History  Problem Relation Age of Onset  . Kidney Stones Father   . Hematuria Father   . Heart failure Father   . Heart failure Mother        Father  . Melanoma Mother   . Diabetes Mother   . Diabetes Sister   . Colon cancer Maternal Aunt   . Breast cancer Maternal Aunt 67       one aunt and one great aunt  . Diabetes Maternal Grandmother   . Ovarian cancer Neg Hx     Social History:  Married. reports that she quit smoking about 19 years ago. Her smoking use included cigarettes. She smoked 0.25 packs per day. She has never used smokeless tobacco. She reports that she does not drink alcohol or use drugs.    Allergies  Allergen Reactions  . Ivp Dye [Iodinated Diagnostic Agents] Anaphylaxis and Other (See Comments)    "chest pains and irregular heartbeat"," cannot tolerate intravenous"  . Sulfa Antibiotics Hives and Shortness Of Breath  . Demerol [Meperidine] Other (See Comments)    "seizures"  .  Flu Virus Vaccine Other (See Comments)    High fever, flushing, hypertension   . Nsaids Palpitations and Other (See Comments)    "GI problems", after long periods will cause irregular heart beat   . Prednisone Other (See Comments) and Hypertension    Hypertension, flushing  . Ketorolac Tromethamine Palpitations    Medications Prior to Admission  Medication Sig Dispense Refill  . acetaminophen (TYLENOL) 325 MG tablet Take 650 mg by mouth daily as needed for moderate pain.    Marland Kitchen aspirin EC 81 MG tablet Take 81 mg by mouth daily.    Marland Kitchen atenolol (TENORMIN) 25 MG tablet Take 25 mg by mouth daily. My take a second 25 mg dose as needed for PVC    . Calcium Polycarbophil (FIBERCON PO) Take 1 Dose by mouth at  bedtime.    . clonazePAM (KLONOPIN) 0.5 MG tablet Take 0.5 mg by mouth 2 (two) times daily as needed for anxiety.  0  . cyclobenzaprine (FLEXERIL) 5 MG tablet Take 5-10 mg by mouth 2 (two) times daily as needed for muscle spasms.  2  . docusate sodium (COLACE) 250 MG capsule Take 250 mg by mouth daily.    Marland Kitchen estradiol (ESTRACE) 0.1 MG/GM vaginal cream Place 1 Applicatorful vaginally every Thursday.    . fenofibrate (TRICOR) 145 MG tablet Take 145 mg by mouth at bedtime.   4  . KLOR-CON M20 20 MEQ tablet Take 20 mEq by mouth daily. May take a second 10 meq dose at night as needed for leg cramps  3  . losartan (COZAAR) 50 MG tablet Take 50 mg by mouth daily.  5  . Magnesium 500 MG CAPS Take 500 mg by mouth daily.     . Melatonin 5 MG CAPS Take 5-10 mg by mouth at bedtime as needed (sleep).    . niacin 500 MG tablet Take 500 mg by mouth at bedtime.    . nortriptyline (PAMELOR) 75 MG capsule Take 75 mg by mouth at bedtime.     Marland Kitchen oxyCODONE (OXY IR/ROXICODONE) 5 MG immediate release tablet Take 5 mg by mouth 3 (three) times daily as needed for severe pain.   0  . polyethylene glycol (MIRALAX / GLYCOLAX) packet Take 17 g by mouth daily.    Marland Kitchen rOPINIRole (REQUIP) 0.5 MG tablet Take 0.5 mg by mouth at bedtime as needed (restless legs).   2  . temazepam (RESTORIL) 15 MG capsule Take 15 mg by mouth daily as needed for sleep (restless legs).   0  . triamterene-hydrochlorothiazide (MAXZIDE-25) 37.5-25 MG per tablet Take 1 tablet by mouth every morning.  3  . estradiol (ESTRING) 2 MG vaginal ring Place 2 mg vaginally every 3 (three) months. follow package directions (Patient not taking: Reported on 06/14/2018) 1 each 12  . glucose blood test strip       Home: Home Living Family/patient expects to be discharged to:: Private residence Living Arrangements: Spouse/significant other Available Help at Discharge: Family Type of Home: House Home Access: Stairs to enter [employer] of Steps: 1 step onto  [place]: One level Bathroom Shower/Tub: Multimedia programmer: Handicapped height Home Equipment: Bedside commode, Shower seat, Environmental consultant - 2 wheels  Functional History: Prior Function Level of Independence: Independent, Independent with assistive device(s) Comments: occasionally using RW just prior to surgery Functional Status:  Mobility: Bed Mobility Overal bed mobility: Needs Assistance Bed Mobility: Rolling, Sidelying to Sit, Sit to Sidelying Rolling: Min assist Sidelying to sit: Min assist Sit to  sidelying: Min assist General bed mobility comments: significant increase in time, verbal cues for precautions, pt able to bring LES up onto bed, pt able to push with R ue into sitting Transfers Overall transfer level: Needs assistance Equipment used: Rolling [place] (2 wheeled) Transfers: Sit to/from Stand Sit to Stand: Min assist General transfer comment: attempted to stand  8 times however pt unable to tolerate due to pain in L LE. when asked how it feels pt states "it feels like my hamstring is this short (about an inch) and when i stand it's being ripped apart" pt instructed to place a lot of weight through the R LE to push up. Pt tearful and in pain and asked to lay down despite encouragement Ambulation/Gait Ambulation/Gait assistance: Mod assist, +2 safety/equipment Gait Distance (Feet): 3 Feet Assistive device: Rolling [place] (2 wheeled) Gait Pattern/deviations: Step-to pattern, Antalgic General Gait Details: Pt only able to walk ~3 ' with RW due to left leg pain with WB, pt also lightheaded.  BPs soft    ADL: ADL Overall ADL's : Needs assistance/impaired Eating/Feeding: Independent, Sitting Grooming: Min guard, Minimal assistance, Standing, Wash/dry hands Grooming Details (indicate cue type and reason): minA progressed to minguard for standing balance Upper Body Bathing: Set up, Sitting Lower Body Bathing: Min guard, Sit to/from stand Lower Body Bathing  Details (indicate cue type and reason): educated on using shower seat initially for task completion at home for increased safety, independence, and increased adherence to back precautions Upper Body Dressing : Minimal assistance, Sitting Upper Body Dressing Details (indicate cue type and reason): minA for brace management; educated pt and family regarding technique for proper donning/doffing Lower Body Dressing: Minimal assistance, Cueing for back precautions, Sit to/from stand Lower Body Dressing Details (indicate cue type and reason): seated supported and able to perform figure 4 technique with increased effort but no increased pain; min assist when standing for balance Toilet Transfer: Minimal assistance, Stand-pivot, Cueing for safety, RW(simulated to recliner) Toilet Transfer Details (indicate cue type and reason): cueing for hand placement and safety Toileting- Clothing Manipulation and Hygiene: Minimal assistance, Sit to/from stand Toileting - Clothing Manipulation Details (indicate cue type and reason): assist for gown management; pt better able to perform peri-care using lateral leans (vs standing) while maintaining back precautions Functional mobility during ADLs: Minimal assistance, Rolling [place] General ADL Comments: pt with slow guarded movement, limited by pain in L LE today; hypotensive BP.   Cognition: Cognition Overall Cognitive Status: Within Functional Limits for tasks assessed Orientation Level: Oriented X4 Cognition Arousal/Alertness: Awake/alert Behavior During Therapy: Anxious Overall Cognitive Status: Within Functional Limits for tasks assessed General Comments: pt very self limiting, asked to use the bathroom but then unable to get up and wants to lay down and use bed pan, pt educated on why it was important to get up to [facility]   Blood pressure 116/85, pulse 93, temperature (!) 100.8 F (38.2 C), temperature source Oral, resp. rate 16, height 5\' 7"  (1.702 m), weight  90.7 kg, SpO2 100 %. Physical Exam  Constitutional: She is oriented to person, place, and time. She appears well-developed.  Obese  HENT:  Head: Normocephalic and atraumatic.  Eyes: EOM are normal. Right eye exhibits no discharge. Left eye exhibits no discharge.  Neck: Normal range of motion. Neck supple.  Cardiovascular: Normal rate and regular rhythm.  Respiratory: Effort normal and breath sounds normal.  GI: Soft. Bowel sounds are normal.  Musculoskeletal:  No edema or tenderness in extremities  Neurological: She is alert  and oriented to person, place, and time.  Motor: B/l UE 5/5 proximal to distal RLE: HF, KE 2+/5, ADF 4+/5 LLE: HF, KE 2-/5, ADF 4+/5  Skin: Skin is warm and dry.  Psychiatric: She has a normal mood and affect. Her behavior is normal. Thought content normal.    Results for orders placed or performed during the hospital encounter of 06/27/18 (from the past 24 hour(s))  Urinalysis, Routine w reflex microscopic     Status: Abnormal   Collection Time: 06/30/18  1:30 PM  Result Value Ref Range   Color, Urine YELLOW YELLOW   APPearance HAZY (A) CLEAR   Specific Gravity, Urine 1.016 1.005 - 1.030   pH 7.0 5.0 - 8.0   Glucose, UA NEGATIVE NEGATIVE mg/dL   Hgb urine dipstick SMALL (A) NEGATIVE   Bilirubin Urine NEGATIVE NEGATIVE   Ketones, ur NEGATIVE NEGATIVE mg/dL   Protein, ur NEGATIVE NEGATIVE mg/dL   Nitrite NEGATIVE NEGATIVE   Leukocytes, UA NEGATIVE NEGATIVE   RBC / HPF 0-5 0 - 5 RBC/hpf   WBC, UA 0-5 0 - 5 WBC/hpf   Bacteria, UA RARE (A) NONE SEEN   Squamous Epithelial / LPF 0-5 0 - 5   Mucus PRESENT    Ct Lumbar Spine Wo Contrast  Result Date: 06/30/2018 CLINICAL DATA:  Worsening left buttock and leg pain following lumbar surgery 3 days ago. EXAM: CT LUMBAR SPINE WITHOUT CONTRAST TECHNIQUE: Multidetector CT imaging of the lumbar spine was performed without intravenous contrast administration. Multiplanar CT image reconstructions were also generated.  COMPARISON:  Lumbar spine MRI 04/30/2018 FINDINGS: Segmentation: 5 lumbar type vertebrae. Rudimentary ribs at T12. Alignment: Reduced L4-5 anterolisthesis following interval fusion. Slight upper lumbar levoscoliosis. Vertebrae: Sequelae of interval L4-5 PLIF are identified. The tips of the L5 pedicle screws slightly extend beyond the anterolateral margin of the L5 vertebral body. There is no abnormal lucency about the screws. The L4-5 interbody implant appears well positioned. No acute fracture or suspicious osseous lesion is identified. Paraspinal and other soft tissues: Partially visualized dense left adrenal gland calcification, chronic based on multiple prior abdominal CTs. Postoperative fluid and gas in the posterior lumbar soft tissues. Few locules of gas in the spinal canal. Disc levels: T11-12: Mild left facet arthrosis without stenosis. T12-L1: Mild facet arthrosis and shallow left foraminal disc protrusion without evidence of significant stenosis, unchanged. L1-2: Mild disc bulging and facet arthrosis without evidence of significant stenosis, unchanged. L2-3: Circumferential disc bulging greater to the right and mild facet hypertrophy result in mild spinal stenosis and mild right greater than left lateral recess stenosis without significant neural foraminal stenosis, unchanged. L3-4: Mild circumferential disc bulging and moderate to severe facet hypertrophy result in mild-to-moderate spinal and left greater than right lateral recess stenosis without evidence of significant neural foraminal stenosis, unchanged. L4-5: Interval laminectomies, left greater than right facetectomies, and fusion. Mild endplate spurring anteriorly in the left neural foramen with perhaps mild residual left neural foraminal stenosis laterally. L5-S1: Mild-to-moderate right and moderate to severe left facet arthrosis without stenosis, unchanged. IMPRESSION: 1. Interval L4-5 PLIF. No evidence of hardware complication or acute  fracture. 2. Unchanged disc and facet degeneration elsewhere. Electronically Signed   By: Logan Bores M.D.   On: 06/30/2018 14:33    Assessment/Plan: Diagnosis: Lumbar stenosis s/p decompression Labs and images (see above) independently reviewed.  Records reviewed and summated above.  1. Does the need for close, 24 hr/day medical supervision in concert with the patient's rehab needs make it unreasonable for  this patient to be served in a less intensive setting? Potentially 2. Co-Morbidities requiring supervision/potential complications: SVT, T2 DM (Monitor in accordance with exercise and adjust meds as necessary), OSA (monitor for signs/symptoms of fluid overload), acute on chronic radiating back pain (Biofeedback training with therapies to help reduce reliance on opiate pain medications, monitor pain control during therapies, and sedation at rest and titrate to maximum efficacy to ensure participation and gains in therapies), anxiety (ensure anxiety and resulting apprehension do not limit functional progress; consider prn medications if warranted), leukocytosis (cont to monitor for signs and symptoms of infection, further workup if indicated) 3. Due to bladder management, bowel management, safety, skin/wound care, disease management, pain management and patient education, does the patient require 24 hr/day rehab nursing? No 4. Does the patient require coordinated care of a physician, rehab nurse, PT (1-2 hrs/day, 5 days/week) and OT (1-2 hrs/day, 5 days/week) to address physical and functional deficits in the context of the above medical diagnosis(es)? Yes Addressing deficits in the following areas: balance, endurance, locomotion, strength, transferring, bowel/bladder control, bathing, dressing, toileting and psychosocial support 5. Can the patient actively participate in an intensive therapy program of at least 3 hrs of therapy per day at least 5 days per week? Yes 6. The potential for patient to  make measurable gains while on inpatient rehab is good 7. Anticipated functional outcomes upon discharge from inpatient rehab are supervision  with PT, supervision with OT, n/a with SLP. 8. Estimated rehab length of stay to reach the above functional goals is: 6-8 days. 9. Anticipated D/C setting: Home 10. Anticipated post D/C treatments: HH therapy and Home excercise program 11. Overall Rehab/Functional Prognosis: excellent  RECOMMENDATIONS: This patient's condition is appropriate for continued rehabilitative care in the following setting: Recommend CIR, however, pt refusing, requesting to go home.  Recommend home with HH.  Patient has agreed to participate in recommended program. Potentially Note that insurance prior authorization may be required for reimbursement for recommended care.  Comment: Rehab Admissions Coordinator to follow up.   I have personally performed a face to face diagnostic evaluation, including, but not limited to relevant history and physical exam findings, of this patient and developed relevant assessment and plan.  Additionally, I have reviewed and concur with the physician assistant's documentation above.   Delice Lesch, MD, ABPMR Bary Leriche, PA-C 07/01/2018

## 2018-07-01 NOTE — Progress Notes (Signed)
Physical Therapy Treatment Patient Details Name: Erin Campos MRN: [id number] DOB: 1956/08/31 Today's Date: 07/01/2018    History of Present Illness Pt is a 62 y/o female now s/p bil L4-5 laminotomy/foraminotomies and PLIF L4-5. PMHx includes anxiety, depression, fibromyalgia, HTN, SVT, DM, DDD    PT Comments    Pt is sidelying on arrival and eager to mobilize as she reports her L LE is feeling better but remains painful.  Pt unable to mobilize to bathroom due to urgency to go and require commode chair be brought to her.  She has support from husband at home.  At this time she is requiring min assistance for all mobilities.  She was unable to wipe her self after using the commode.   Continue to recommend CIR therapies at this time to allow for intensive rehab before returning home to private residence with support from spouse.      Follow Up Recommendations  CIR(till feel that CIR is an appropriate recommendation based on performance.  However she is refusing and will require HHPT and an aide at d/c.  )     Equipment Recommendations  None recommended by PT    Recommendations for Other Services Rehab consult     Precautions / Restrictions Precautions Precautions: Back Precaution Booklet Issued: Yes (comment) Precaution Comments: reviewed verbally with patient Required Braces or Orthoses: Spinal Brace Spinal Brace: Lumbar corset;Applied in sitting position Restrictions Weight Bearing Restrictions: No    Mobility  Bed Mobility Overal bed mobility: Needs Assistance Bed Mobility: Sidelying to Sit;Rolling Rolling: Min assist Sidelying to sit: Min assist       General bed mobility comments: Pt in partial sidelying to the R required cues to roll to the R to transition into complete sidelying.  Pt is slow and guarded with mobility.  Reqired min assistance to elevate trunk into sitting.    Transfers Overall transfer level: Needs assistance Equipment used: Rolling Bressman (2  wheeled) Transfers: Sit to/from Stand Sit to Stand: Min assist         General transfer comment: Pt slow and unscuccessful to complete transfer on first attempt due to LLE pain.  Pt required cues hand placement and min assistance to boost into standing.  Cues for foot placement of L side to reduce pain.  Pt presents with poor eccentric loading returning to seated surface.    Ambulation/Gait Ambulation/Gait assistance: Min assist;+2 safety/equipment Gait Distance (Feet): 6 Feet(attempted to ambulate into her bathroom but she needed to void before completing distance.  required placement of BSC behind patient for use.  Post toileting performed 40 ft gait trial.  ) Assistive device: Rolling Rasheed (2 wheeled) Gait Pattern/deviations: Step-to pattern;Antalgic     General Gait Details: Cues for sequencing, adjusted RW for improved fit.  Pt is slow and guarded with mobility.  Educated on UE use in L stance phase to alleviate pain.  Complaints of mild dizziness.  Did not assess BP as MD entered room and unconcerned with presentatioin.     Stairs             Wheelchair Mobility    Modified Rankin (Stroke Patients Only)       Balance Overall balance assessment: Needs assistance Sitting-balance support: Feet supported;Bilateral upper extremity supported Sitting balance-Leahy Scale: Fair       Standing balance-Leahy Scale: Fair  Cognition Arousal/Alertness: Awake/alert Behavior During Therapy: Anxious Overall Cognitive Status: Impaired/Different from baseline Area of Impairment: Safety/judgement                         Safety/Judgement: Decreased awareness of safety;Decreased awareness of deficits     General Comments: Pt continues to progress but reports she is feeling much better , she does not understand her limitations fully at this time.        Exercises      General Comments        Pertinent Vitals/Pain Pain  Assessment: Faces Faces Pain Scale: Hurts little more Pain Location: left leg from buttocks to foot, reports pain is improved  Pain Descriptors / Indicators: Grimacing;Guarding Pain Intervention(s): Monitored during session;Repositioned    Home Living                      Prior Function            PT Goals (current goals can now be found in the care plan section) Acute Rehab PT Goals Patient Stated Goal: decrease left leg radicular pain Potential to Achieve Goals: Good Progress towards PT goals: Progressing toward goals    Frequency    Min 5X/week      PT Plan Current plan remains appropriate    Co-evaluation              AM-PAC PT "6 Clicks" Daily Activity  Outcome Measure  Difficulty turning over in bed (including adjusting bedclothes, sheets and blankets)?: Unable Difficulty moving from lying on back to sitting on the side of the bed? : Unable Difficulty sitting down on and standing up from a chair with arms (e.g., wheelchair, bedside commode, etc,.)?: Unable Help needed moving to and from a bed to chair (including a wheelchair)?: A Lot Help needed walking in hospital room?: A Lot Help needed climbing 3-5 steps with a railing? : Total 6 Click Score: 8    End of Session Equipment Utilized During Treatment: Back brace;Gait belt Activity Tolerance: Patient limited by pain Patient left: in bed;with call bell/phone within reach;with family/visitor present Nurse Communication: Mobility status PT Visit Diagnosis: Muscle weakness (generalized) (M62.81);Difficulty in walking, not elsewhere classified (R26.2);Pain;Other symptoms and signs involving the nervous system (R29.898) Pain - Right/Left: Left Pain - part of body: Leg     Time: 1027-1100 PT Time Calculation (min) (ACUTE ONLY): 33 min  Charges:  $Gait Training: 8-22 mins $Therapeutic Activity: 8-22 mins                     Governor Rooks, PTA Acute Rehabilitation Services Pager  [phone] Office [phone]     Erin Campos [name] 07/01/2018, 11:16 AM

## 2018-07-01 NOTE — Progress Notes (Signed)
Subjective: The patient is alert and pleasant.  Her husband is at the bedside.  She looks well.  She states she normally takes aspirin with the niacin to prevent a flushing sensation.  She really wants to resume her aspirin.  We discussed rehabilitation.  She was not interested.  Her leg pain is much better.  Objective: Vital signs in last 24 hours: Temp:  [98.8 F (37.1 C)-100.8 F (38.2 C)] 100.8 F (38.2 C) (10/06 1957) Pulse Rate:  [84-119] 93 (10/07 0908) Resp:  [11-16] 16 (10/07 0810) BP: (116-149)/(62-85) 116/85 (10/07 0908) SpO2:  [95 %-100 %] 100 % (10/07 0810) Estimated body mass index is 31.32 kg/m as calculated from the following:   Height as of this encounter: 5\' 7"  (1.702 m).   Weight as of this encounter: 90.7 kg.   Intake/Output from previous day: 10/06 0701 - 10/07 0700 In: 42.6 [I.V.:42.6] Out: -  Intake/Output this shift: Total I/O In: 120 [P.O.:120] Out: -   Physical exam patient is alert and pleasant.  Her strength is grossly normal in her lower extremities.  Lab Results: Recent Labs    06/29/18 0809  WBC 15.4*  HGB 11.5*  HCT 34.6*  PLT 230   BMET No results for input(s): NA, K, CL, CO2, GLUCOSE, BUN, CREATININE, CALCIUM in the last 72 hours.  Studies/Results: Ct Lumbar Spine Wo Contrast  Result Date: 06/30/2018 CLINICAL DATA:  Worsening left buttock and leg pain following lumbar surgery 3 days ago. EXAM: CT LUMBAR SPINE WITHOUT CONTRAST TECHNIQUE: Multidetector CT imaging of the lumbar spine was performed without intravenous contrast administration. Multiplanar CT image reconstructions were also generated. COMPARISON:  Lumbar spine MRI 04/30/2018 FINDINGS: Segmentation: 5 lumbar type vertebrae. Rudimentary ribs at T12. Alignment: Reduced L4-5 anterolisthesis following interval fusion. Slight upper lumbar levoscoliosis. Vertebrae: Sequelae of interval L4-5 PLIF are identified. The tips of the L5 pedicle screws slightly extend beyond the anterolateral  margin of the L5 vertebral body. There is no abnormal lucency about the screws. The L4-5 interbody implant appears well positioned. No acute fracture or suspicious osseous lesion is identified. Paraspinal and other soft tissues: Partially visualized dense left adrenal gland calcification, chronic based on multiple prior abdominal CTs. Postoperative fluid and gas in the posterior lumbar soft tissues. Few locules of gas in the spinal canal. Disc levels: T11-12: Mild left facet arthrosis without stenosis. T12-L1: Mild facet arthrosis and shallow left foraminal disc protrusion without evidence of significant stenosis, unchanged. L1-2: Mild disc bulging and facet arthrosis without evidence of significant stenosis, unchanged. L2-3: Circumferential disc bulging greater to the right and mild facet hypertrophy result in mild spinal stenosis and mild right greater than left lateral recess stenosis without significant neural foraminal stenosis, unchanged. L3-4: Mild circumferential disc bulging and moderate to severe facet hypertrophy result in mild-to-moderate spinal and left greater than right lateral recess stenosis without evidence of significant neural foraminal stenosis, unchanged. L4-5: Interval laminectomies, left greater than right facetectomies, and fusion. Mild endplate spurring anteriorly in the left neural foramen with perhaps mild residual left neural foraminal stenosis laterally. L5-S1: Mild-to-moderate right and moderate to severe left facet arthrosis without stenosis, unchanged. IMPRESSION: 1. Interval L4-5 PLIF. No evidence of hardware complication or acute fracture. 2. Unchanged disc and facet degeneration elsewhere. Electronically Signed   By: Logan Bores M.D.   On: 06/30/2018 14:33    Assessment/Plan: Postop day #4: The patient is doing well.  She is not interested in rehab.  She will likely go home tomorrow.  Her lumbar  CT looks good.  LOS: 4 days     Ophelia Charter 07/01/2018, 11:48  AM

## 2018-07-01 NOTE — Progress Notes (Signed)
Patient has home CPAP only required help with water, patient stated she does not need help placing mask on for sleep. No distress noted at this time.

## 2018-07-02 MED ORDER — OXYCODONE HCL 5 MG PO TABS: 5.0000 mg | ORAL_TABLET | ORAL | 0 refills | Status: DC | PRN

## 2018-07-02 NOTE — Discharge Summary (Signed)
Physician Discharge Summary  Patient ID: Erin Campos MRN: [id number] DOB/AGE: 31-Dec-1955 62 y.o.  Admit date: 06/27/2018 Discharge date: 07/02/2018  Admission Diagnoses: Lumbar spondylolisthesis, lumbar stenosis, lumbar radiculopathy, neurogenic claudication, lumbago  Discharge Diagnoses: The same Active Problems:   Spondylolisthesis of lumbar region   Post-operative pain   Chronic bilateral low back pain with left-sided sciatica   SVT (supraventricular tachycardia) ([place])   Diabetes mellitus type 2 in obese (HCC)   Generalized anxiety disorder   Leukocytosis   Surgery, elective   Discharged Condition: good  Hospital Course: I performed an L4-5 decompression, nstrumentation, and fusion on the patient on 06/27/2018.  Surgery went well.  The patient's postoperative course was remarkable for pain.  Complained of left leg pain and a CAT scan was obtained which looked good.  Her pain resolved.  On 07/02/2018 the patient requested discharge home.  She and her husband, were given written and oral discharge instructions.  All her questions were answered.  Consults: PT, OT Significant Diagnostic Studies: Lumbar CT Treatments: L4-5 decompression, instrumentation and fusion. Discharge Exam: Blood pressure (!) 115/51, pulse 80, temperature 99 F (37.2 C), temperature source Oral, resp. rate 15, height 5\' 7"  (1.702 m), weight 90.7 kg, SpO2 100 %. The patient is alert and pleasant.  Her strength is normal her wound is healing well.  Disposition: Home  Discharge Instructions    Call MD for:  difficulty breathing, headache or visual disturbances   Complete by:  As directed    Call MD for:  extreme fatigue   Complete by:  As directed    Call MD for:  hives   Complete by:  As directed    Call MD for:  persistant dizziness or light-headedness   Complete by:  As directed    Call MD for:  persistant nausea and vomiting   Complete by:  As directed    Call MD for:  redness, tenderness, or  signs of infection (pain, swelling, redness, odor or green/yellow discharge around incision site)   Complete by:  As directed    Call MD for:  severe uncontrolled pain   Complete by:  As directed    Call MD for:  temperature >100.4   Complete by:  As directed    Diet - low sodium heart healthy   Complete by:  As directed    Discharge instructions   Complete by:  As directed    Call [phone] for a followup appointment. Take a stool softener while you are using pain medications.   Driving Restrictions   Complete by:  As directed    Do not drive for 2 weeks.   Increase activity slowly   Complete by:  As directed    Lifting restrictions   Complete by:  As directed    Do not lift more than 5 pounds. No excessive bending or twisting.   May shower / Bathe   Complete by:  As directed    Remove the dressing for 3 days after surgery.  You may shower, but leave the incision alone.   No dressing needed   Complete by:  As directed      Allergies as of 07/02/2018      Reactions   Ivp Dye [iodinated Diagnostic Agents] Anaphylaxis, Other (See Comments)   "chest pains and irregular heartbeat"," cannot tolerate intravenous"   Sulfa Antibiotics Hives, Shortness Of Breath   Demerol [meperidine] Other (See Comments)   "seizures"   Flu Virus Vaccine Other (See Comments)  High fever, flushing, hypertension    Nsaids Palpitations, Other (See Comments)   "GI problems", after long periods will cause irregular heart beat    Prednisone Other (See Comments), Hypertension   Hypertension, flushing   Ketorolac Tromethamine Palpitations      Medication List    STOP taking these medications   acetaminophen 325 MG tablet Commonly known as:  TYLENOL     TAKE these medications   aspirin EC 81 MG tablet Take 81 mg by mouth daily.   atenolol 25 MG tablet Commonly known as:  TENORMIN Take 25 mg by mouth daily. My take a second 25 mg dose as needed for PVC   clonazePAM 0.5 MG tablet Commonly  known as:  KLONOPIN Take 0.5 mg by mouth 2 (two) times daily as needed for anxiety.   cyclobenzaprine 5 MG tablet Commonly known as:  FLEXERIL Take 5-10 mg by mouth 2 (two) times daily as needed for muscle spasms.   docusate sodium 250 MG capsule Commonly known as:  COLACE Take 250 mg by mouth daily.   estradiol 0.1 MG/GM vaginal cream Commonly known as:  ESTRACE Place 1 Applicatorful vaginally every Thursday. What changed:  Another medication with the same name was removed. Continue taking this medication, and follow the directions you see here.   fenofibrate 145 MG tablet Commonly known as:  TRICOR Take 145 mg by mouth at bedtime.   FIBERCON PO Take 1 Dose by mouth at bedtime.   glucose blood test strip   KLOR-CON M20 20 MEQ tablet Generic drug:  potassium chloride SA Take 20 mEq by mouth daily. May take a second 10 meq dose at night as needed for leg cramps   losartan 50 MG tablet Commonly known as:  COZAAR Take 50 mg by mouth daily.   Magnesium 500 MG Caps Take 500 mg by mouth daily.   Melatonin 5 MG Caps Take 5-10 mg by mouth at bedtime as needed (sleep).   niacin 500 MG tablet Take 500 mg by mouth at bedtime.   nortriptyline 75 MG capsule Commonly known as:  PAMELOR Take 75 mg by mouth at bedtime.   oxyCODONE 5 MG immediate release tablet Commonly known as:  Oxy IR/ROXICODONE Take 1 tablet (5 mg total) by mouth every 4 (four) hours as needed for moderate pain ((score 4 to 6)). What changed:    when to take this  reasons to take this   polyethylene glycol packet Commonly known as:  MIRALAX / GLYCOLAX Take 17 g by mouth daily.   rOPINIRole 0.5 MG tablet Commonly known as:  REQUIP Take 0.5 mg by mouth at bedtime as needed (restless legs).   temazepam 15 MG capsule Commonly known as:  RESTORIL Take 15 mg by mouth daily as needed for sleep (restless legs).   triamterene-hydrochlorothiazide 37.5-25 MG tablet Commonly known as:  MAXZIDE-25 Take 1  tablet by mouth every morning.        Signed: Ophelia Charter 07/02/2018, 7:57 AM

## 2018-07-02 NOTE — Care Management Note (Signed)
Case Management Note  Patient Details  Name: CIRCE CHILTON MRN: [id number] Date of Birth: [birth year]  Subjective/Objective:   Pt is a 62 y/o female now s/p bil L4-5 laminotomy/foraminotomies and PLIF L4-5.   PTA, pt independent with assistive devices, lives with spouse.                   Action/Plan: PT/OT recommending CIR, but pt refusing.  [place] services recommended, but pt also refuses HH follow up.  She states spouse is available 24h/7.  She denies any DME needs for home.    Expected Discharge Date:  07/02/18               Expected Discharge Plan:  [place]  In-House Referral:     Discharge planning Services  CM Consult  Post Acute Care Choice:    Choice offered to:     DME Arranged:    DME Agency:     HH Arranged:  Patient Refused [place] Agency:     Status of Service:  Completed, signed off  If discussed at [employer] of Stay Meetings, dates discussed:    Additional Comments:  Reinaldo Raddle, RN, BSN  Trauma/Neuro ICU Case Manager [phone]

## 2018-07-02 NOTE — Progress Notes (Signed)
Occupational Therapy Treatment Patient Details Name: Erin Campos MRN: [id number] DOB: [birth year] Today's Date: 07/02/2018    History of present illness Pt is a 62 y/o female now s/p bil L4-5 laminotomy/foraminotomies and PLIF L4-5. PMHx includes anxiety, depression, fibromyalgia, HTN, SVT, DM, DDD   OT comments  Pt completed dressing for home with spouse present. Pt with difficulty with transition from sitting and standing with LB pulling up skirt. Pt able to complete but unsteady with task. Pt and spouse with no questions. Pt could benefit from further therapy due to balance deficits.    Follow Up Recommendations  Home health OT    Equipment Recommendations  3 in 1 bedside commode(pt plans to personally buy [facility])    Recommendations for Other Services      Precautions / Restrictions Precautions Precautions: Back Precaution Comments: able to recall back precautions and apply this session Required Braces or Orthoses: Spinal Brace Spinal Brace: Lumbar corset;Applied in sitting position       Mobility Bed Mobility               General bed mobility comments: EOB on arrival unsupported eating without brace on  Transfers Overall transfer level: Needs assistance Equipment used: Rolling Vigeant (2 wheeled) Transfers: Sit to/from Stand Sit to Stand: Min guard         General transfer comment: pt needs incr time to power up and incr time to problem solve hand placement    Balance Overall balance assessment: Needs assistance Sitting-balance support: Feet supported Sitting balance-Leahy Scale: Good     Standing balance support: Bilateral upper extremity supported;During functional activity Standing balance-Leahy Scale: Fair Standing balance comment: relies on RW heavily                           ADL either performed or assessed with clinical judgement   ADL Overall ADL's : Needs assistance/impaired Eating/Feeding: Independent                    Lower Body Dressing: Minimal assistance;Sit to/from stand Lower Body Dressing Details (indicate cue type and reason): pt able to figure 4 at the EOB and encouraged to dress L L efirs tand then R LE Toilet Transfer: Min guard;RW           Functional mobility during ADLs: Min guard;Rolling [place] General ADL Comments: Pt dressing for home, educated on don of brace when unsupported at eob and to stay inside the RW. pt lifting RW and not pushing the RW     Vision       Perception     Praxis      Cognition Arousal/Alertness: Awake/alert Behavior During Therapy: WFL for tasks assessed/performed Overall Cognitive Status: Within Functional Limits for tasks assessed                                          Exercises     Shoulder Instructions       General Comments incision dry and open to air    Pertinent Vitals/ Pain       Pain Assessment: Faces Pain Location: 2 Pain Descriptors / Indicators: Grimacing;Operative site guarding Pain Intervention(s): Monitored during session;Repositioned  Home Living  Prior Functioning/Environment              Frequency  Min 2X/week        Progress Toward Goals  OT Goals(current goals can now be found in the care plan section)  Progress towards OT goals: Progressing toward goals  Acute Rehab OT Goals Patient Stated Goal: to go home today OT Goal Formulation: With patient Time For Goal Achievement: 07/12/18 Potential to Achieve Goals: Good ADL Goals Pt Will Perform Grooming: (met) Pt Will Perform Lower Body Bathing: with supervision;sit to/from stand Pt Will Perform Upper Body Dressing: with set-up;sitting Pt Will Perform Lower Body Dressing: with supervision;sit to/from stand Pt Will Transfer to Toilet: with supervision;ambulating;bedside commode Pt Will Perform Toileting - Clothing Manipulation and hygiene: with supervision;sitting/lateral  leans;sit to/from stand Additional ADL Goal #1: Pt will perform bed mobility at supervision level as precursor to ADL task.  Plan Discharge plan needs to be updated    Co-evaluation                 AM-PAC PT "6 Clicks" Daily Activity     Outcome Measure   Help from another person eating meals?: None Help from another person taking care of personal grooming?: A Little Help from another person toileting, which includes using toliet, bedpan, or urinal?: A Little Help from another person bathing (including washing, rinsing, drying)?: A Little Help from another person to put on and taking off regular upper body clothing?: A Little Help from another person to put on and taking off regular lower body clothing?: A Little 6 Click Score: 19    End of Session Equipment Utilized During Treatment: Back brace  OT Visit Diagnosis: Other abnormalities of gait and mobility (R26.89);Unsteadiness on feet (R26.81)   Activity Tolerance Patient tolerated treatment well   Patient Left in chair;with call bell/phone within reach;with chair alarm set;with family/visitor present   Nurse Communication Mobility status;Precautions        Time: 7628-3151 OT Time Calculation (min): 22 min  Charges: OT General Charges $OT Visit: 1 Visit OT Treatments $Self Care/Home Management : 8-22 mins   Jeri Modena, OTR/L  Acute Rehabilitation Services Pager: [phone] Office: [phone] .    Parke Poisson B 07/02/2018, 9:29 AM

## 2018-07-04 ENCOUNTER — Other Ambulatory Visit: Payer: Self-pay

## 2018-07-04 NOTE — Patient Outreach (Deleted)
[facility] [facility]) Care Management  [place]  07/04/2018  JOLANDA MCCANN [birth year] [phone]  62 year old female outreached by [place] services for a 30 day post discharge medication review.  PMHx includes, but not limited to, hypertension, supraventricular tachycardia, rectocele, type 2 diabetes mellitus and bipolar disorder.   Unsuccessful telephone call attempt #1 to patient.    -HIPAA compliant voicemail left requesting a return call.      Plan:  I will make another outreach attempt to patient within 3-4 business days.   Joetta Manners, PharmD Clinical Pharmacist [place] [phone]

## 2018-07-04 NOTE — Patient Outreach (Signed)
[place] [facility]) Care Management  [place]  07/04/2018  Erin Campos [birth year] [id number]  62 year old female outreached by [place] services for a 30 day post discharge medication review.  PMHx includes, but not limited to, hypertension, supraventricular tachycardia, rectocele, type 2 diabetes mellitus and bipolar disorder.   Unsuccessful telephone call attempt #1 to patient.    -HIPAA compliant voicemail left requesting a return call.      Plan:  I will make another outreach attempt to patient within 3-4 business days.   Joetta Manners, PharmD Clinical Pharmacist [place] [phone]  Addendum: Incoming call received from Erin Campos and HIPAA identifiers verified.   Subjective: Erin Campos states that she can tell that she has less pressure in her back. She reports that she is having a lot of nerve pain on her left side through her buttocks and down her leg.  She States that she checks her glucose every 2-3 days.  Patient reports that she is a diet controlled diabetic and  has not taken diabetes medications since she lost 100 lbs.   Objective:  HgA1c 5.9% on 06/18/18 SCr 1.13 mg/dL on 06/28/18  Current Medications: Current Outpatient Medications  Medication Sig Dispense Refill  . aspirin EC 81 MG tablet Take 81 mg by mouth at bedtime.     Marland Kitchen atenolol (TENORMIN) 25 MG tablet Take 25 mg by mouth daily. My take a second 25 mg dose as needed for PVC    . Calcium Polycarbophil (FIBERCON PO) Take 1 Dose by mouth at bedtime.    . clonazePAM (KLONOPIN) 0.5 MG tablet Take 0.5 mg by mouth 2 (two) times daily as needed for anxiety.  0  . cyclobenzaprine (FLEXERIL) 5 MG tablet Take 5-10 mg by mouth 2 (two) times daily as needed for muscle spasms.  2  . docusate sodium (COLACE) 250 MG capsule Take 250 mg by mouth daily.    Marland Kitchen estradiol (ESTRACE) 0.1 MG/GM vaginal cream Place 1 Applicatorful vaginally every Thursday.    . fenofibrate  (TRICOR) 145 MG tablet Take 145 mg by mouth at bedtime.   4  . glucose blood test strip     . KLOR-CON M20 20 MEQ tablet Take 20 mEq by mouth daily. May take a second 10 meq dose at night as needed for leg cramps  3  . losartan (COZAAR) 50 MG tablet Take 50 mg by mouth daily.  5  . Magnesium 500 MG CAPS Take 500 mg by mouth daily.     . Melatonin 5 MG CAPS Take 5 mg by mouth at bedtime as needed (sleep).     . niacin 500 MG tablet Take 500 mg by mouth at bedtime.    . nortriptyline (PAMELOR) 75 MG capsule Take 75 mg by mouth at bedtime.     Marland Kitchen oxyCODONE (OXY IR/ROXICODONE) 5 MG immediate release tablet Take 1 tablet (5 mg total) by mouth every 4 (four) hours as needed for moderate pain ((score 4 to 6)). 30 tablet 0  . polyethylene glycol (MIRALAX / GLYCOLAX) packet Take 17 g by mouth daily.    Marland Kitchen rOPINIRole (REQUIP) 0.5 MG tablet Take 0.5 mg by mouth at bedtime as needed (restless legs).   2  . temazepam (RESTORIL) 15 MG capsule Take 15 mg by mouth daily as needed for sleep (restless legs).   0  . triamterene-hydrochlorothiazide (MAXZIDE-25) 37.5-25 MG per tablet Take 1 tablet by mouth every morning.  3   No current facility-administered  medications for this visit.     Functional Status: In your present state of health, do you have any difficulty performing the following activities: 06/27/2018 06/18/2018  Hearing? N N  Vision? N N  Difficulty concentrating or making decisions? N N  Walking or climbing stairs? Y Y  Dressing or bathing? N N  Doing errands, shopping? N -  Some recent data might be hidden    Fall/Depression Screening: No flowsheet data found. No flowsheet data found.  ASSESSMENT: Date Discharged from Hospital: 07/02/18 Date Medication Reconciliation Performed: 07/04/2018  Medications Discontinued at Discharge:   acetaminophen  New Medications at Discharge:  Oxycodone IR -pt had old prescription  Patient was recently discharged from hospital and all medications have  been reviewed  Drugs sorted by system:  Neurologic/Psychologic:clonazepam, temazepam, nortriptyline, ropinirole  Cardiovascular: aspirin, atenolol, fenofibrate, potassium chloride, losartan, niacin, triamterene/hydrochlorthiazide  Gastrointestinal: calcium polycarbophil, docusate sodium, polyethylene glycol  Topical: estradiol vaginal   Pain: oxycodone IR  Vitamins/Minerals: magnesium   Miscellaneous: cyclobenzaprine, melatonin  Gaps in therapy:  Per guideline directed therapy, diabetic patients should be on statin therapy.  Please consider adding.   Drug interactions:  Co-administration of potassium chloride with a potassium sparing diuretic may lead to hyperkalemia, possibly leading to cardiac arrhythmias.    PLAN: Route note to PCP, Dr. Ellison Hughs.  Joetta Manners, PharmD Clinical Pharmacist [place] [phone]

## 2018-07-08 ENCOUNTER — Other Ambulatory Visit: Payer: Self-pay

## 2018-07-08 NOTE — Patient Outreach (Signed)
[place] [facility]) Care Management  07/08/2018  Erin Campos [birth year] [id number]   Referral Date: 07/08/18 Referral Source: Self referral Referral Reason: Patient has questions.   Outreach Attempt:Spoke with patient.  She is able to verify HIPAA.  Patient reports that she has questions about some things that happened during her hospitalization.  She states she is doing good post-op and taking things day by day.  Patient reports that she has help in the home and declines any needs.  Discussed with patient that there is a patient experience line that she could call to ask specific questions.  She verbalized understanding and stated she was not sure where to start with getting her questions answered.  CM gave patient the patient experience line number [phone].  She verbalized understanding and thankful for the quick response.     Plan: RN CM will close case.     Jone Baseman, RN, MSN [facility] Care Management Care Management Coordinator Direct Line [phone] Toll Free: [phone]  Fax: [phone]

## 2018-07-09 ENCOUNTER — Ambulatory Visit: Payer: PPO

## 2018-07-10 ENCOUNTER — Other Ambulatory Visit: Payer: Self-pay

## 2018-07-10 NOTE — Patient Outreach (Signed)
[place] [facility]) Care Management  07/10/2018  AOI KOUNS 08/21/1956 [id number]   EMMI- General Discharge RED ON EMMI ALERT Day # 4 Date: 07/09/18 Red Alert Reason:  Questions about discharge papers? Yes  Know who to call about changes in condition? No  Other questions/problems? Yes    Outreach attempt: no answer.  HIPAA compliant voice message left.   Plan: RN CM will attempt patient again within 4 business days and send letter.    Jone Baseman, RN, MSN [facility] Care Management Care Management Coordinator Direct Line [phone] Toll Free: [phone]  Fax: [phone]

## 2018-07-11 ENCOUNTER — Other Ambulatory Visit: Payer: Self-pay

## 2018-07-11 NOTE — Patient Outreach (Signed)
[place] [facility]) Care Management  07/11/2018  TAELYR JANTZ 1955/10/03 [id number]   EMMI- General Discharge RED ON EMMI ALERT Day # 4 Date:07/09/18 Red Alert Reason: Questions about discharge papers? Yes  Know who to call about changes in condition? No  Other questions/problems? Yes    Outreach attempt: Spoke with patient.  Discussed red alerts with patient.  She states that the concerns shared on previous call where the same things she recorded for the EMMI.  She states that she is waiting for call back from patient outreach.  Also advised patient that there is an online form she could fill out.  Website given to patient to use as she would like.  Patient voices no other questions or concerns.    Plan: RN CM will close case at this time.  Jone Baseman, RN, MSN [place] Management Care Management Coordinator Direct Line [phone] Cell [phone] Toll Free: [phone]  Fax: [phone]

## 2018-07-12 ENCOUNTER — Ambulatory Visit: Payer: PPO

## 2018-07-19 DIAGNOSIS — M5416 Radiculopathy, lumbar region: Secondary | ICD-10-CM | POA: Diagnosis not present

## 2018-07-31 DIAGNOSIS — G2581 Restless legs syndrome: Secondary | ICD-10-CM | POA: Diagnosis not present

## 2018-07-31 DIAGNOSIS — F330 Major depressive disorder, recurrent, mild: Secondary | ICD-10-CM | POA: Diagnosis not present

## 2018-07-31 DIAGNOSIS — K219 Gastro-esophageal reflux disease without esophagitis: Secondary | ICD-10-CM | POA: Diagnosis not present

## 2018-09-05 ENCOUNTER — Emergency Department
Admission: EM | Admit: 2018-09-05 | Discharge: 2018-09-05 | Disposition: A | Discharge status: Home / Self Care | Payer: PPO | Attending: Emergency Medicine | Admitting: Emergency Medicine

## 2018-09-05 DIAGNOSIS — F330 Major depressive disorder, recurrent, mild: Secondary | ICD-10-CM | POA: Diagnosis not present

## 2018-09-05 DIAGNOSIS — K625 Hemorrhage of anus and rectum: Secondary | ICD-10-CM | POA: Diagnosis present

## 2018-09-05 DIAGNOSIS — I10 Essential (primary) hypertension: Secondary | ICD-10-CM | POA: Diagnosis not present

## 2018-09-05 DIAGNOSIS — E119 Type 2 diabetes mellitus without complications: Secondary | ICD-10-CM | POA: Diagnosis not present

## 2018-09-05 DIAGNOSIS — Z85828 Personal history of other malignant neoplasm of skin: Secondary | ICD-10-CM | POA: Diagnosis not present

## 2018-09-05 DIAGNOSIS — I252 Old myocardial infarction: Secondary | ICD-10-CM | POA: Insufficient documentation

## 2018-09-05 DIAGNOSIS — Z87891 Personal history of nicotine dependence: Secondary | ICD-10-CM | POA: Diagnosis not present

## 2018-09-05 DIAGNOSIS — K219 Gastro-esophageal reflux disease without esophagitis: Secondary | ICD-10-CM | POA: Diagnosis not present

## 2018-09-05 DIAGNOSIS — Z79899 Other long term (current) drug therapy: Secondary | ICD-10-CM | POA: Diagnosis not present

## 2018-09-05 DIAGNOSIS — Z7982 Long term (current) use of aspirin: Secondary | ICD-10-CM | POA: Insufficient documentation

## 2018-09-05 DIAGNOSIS — K649 Unspecified hemorrhoids: Secondary | ICD-10-CM | POA: Diagnosis not present

## 2018-09-05 DIAGNOSIS — G2581 Restless legs syndrome: Secondary | ICD-10-CM | POA: Diagnosis not present

## 2018-09-05 LAB — COMPREHENSIVE METABOLIC PANEL
ALT: 16 U/L (ref 0–44)
AST: 19 U/L (ref 15–41)
Albumin: 4 g/dL (ref 3.5–5.0)
Alkaline Phosphatase: 63 U/L (ref 38–126)
Anion gap: 7 (ref 5–15)
BUN: 23 mg/dL (ref 8–23)
CO2: 26 mmol/L (ref 22–32)
CREATININE: 1.24 mg/dL — AB (ref 0.44–1.00)
Calcium: 9.3 mg/dL (ref 8.9–10.3)
Chloride: 107 mmol/L (ref 98–111)
GFR calc non Af Amer: 47 mL/min — ABNORMAL LOW (ref >60)
GFR, EST AFRICAN AMERICAN: 54 mL/min — AB (ref >60)
Glucose, Bld: 118 mg/dL — ABNORMAL HIGH (ref 70–99)
Potassium: 3.3 mmol/L — ABNORMAL LOW (ref 3.5–5.1)
Sodium: 140 mmol/L (ref 135–145)
Total Bilirubin: 0.5 mg/dL (ref 0.3–1.2)
Total Protein: 7.5 g/dL (ref 6.5–8.1)

## 2018-09-05 LAB — CBC
HEMATOCRIT: 39.2 % (ref 36.0–46.0)
Hemoglobin: 12.9 g/dL (ref 12.0–15.0)
MCH: 29.5 pg (ref 26.0–34.0)
MCHC: 32.9 g/dL (ref 30.0–36.0)
MCV: 89.7 fL (ref 80.0–100.0)
Platelets: 300 10*3/uL (ref 150–400)
RBC: 4.37 MIL/uL (ref 3.87–5.11)
RDW: 13.2 % (ref 11.5–15.5)
WBC: 10 10*3/uL (ref 4.0–10.5)
nRBC: 0 % (ref 0.0–0.2)

## 2018-09-05 MED ORDER — HYDROCORTISONE ACETATE 25 MG RE SUPP: 25.0000 mg | Freq: Two times a day (BID) | RECTAL | 0 refills | Status: AC

## 2018-09-05 MED ORDER — HYDROCORTISONE ACETATE 25 MG RE SUPP
25.0000 mg | Freq: Once | RECTAL | Status: AC
  Administered 2018-09-05: 25 mg via RECTAL
  Filled 2018-09-05: qty 1

## 2018-09-05 NOTE — ED Notes (Addendum)
Pt noticed bright red blood in stool today. Husband at bedside. Dr. Owens Shark at bedside, checked pt bottom. PT alert and oriented x 4. PT denies pain.

## 2018-09-05 NOTE — ED Triage Notes (Signed)
See paper chart for downtime charting 

## 2018-09-05 NOTE — ED Provider Notes (Signed)
[facility] Emergency Department Provider Note _________________   I have reviewed the triage vital signs and the nursing notes.   HISTORY  Chief Complaint Rectal Bleeding    HPI Erin Campos is a 62 y.o. female with below list of chronic medical conditions presents to the emergency department secondary to bright red blood per rectum x2 episodes.  Patient states that she had an episode on Monday and then subsequently tonight.  She states she had discomfort with the episode on Monday however none tonight.  Patient denies any abdominal pain no fever.  Patient states that she had a colonoscopy performed 5 years ago which had a small benign polyp that time which was removed.  In addition patient denies any known history of diverticulosis or diverticulitis.   Past Medical History:  Diagnosis Date  . Anxiety   . Arrhythmia   . Arthritis   . Bulging lumbar disc   . Controlled diabetes mellitus ([place])    Diet controlled  . Cystocele   . DDD (degenerative disc disease), cervical   . Depression   . External hemorrhoids   . Facial basal cell cancer   . Fibromyalgia    Diagnosed in 2010  . Heart attack ([place])    Resulted on EKG -2015  . High cholesterol   . History of kidney stones   . HTN (hypertension)   . Rectocele   . Recurrent UTI   . Sleep apnea    CPAP  . Spleen anomaly    compressed  . SVT (supraventricular tachycardia) [facility])     Patient Active Problem List   Diagnosis Date Noted  . Post-operative pain   . Chronic bilateral low back pain with left-sided sciatica   . SVT (supraventricular tachycardia) ([place])   . Diabetes mellitus type 2 in obese ([place])   . Generalized anxiety disorder   . Leukocytosis   . Surgery, elective   . Spondylolisthesis of lumbar region 06/27/2018  . Supraventricular tachycardia ([place]) 11/16/2015  . Incomplete rectal prolapse 11/16/2015  . Adrenal nodule ([place]) 09/12/2015  . Bipolar I disorder ([place]) 08/26/2015    . Essential (primary) hypertension 08/26/2015  . Arthralgia of hip 08/26/2015  . H/O disease 08/26/2015  . Decreased potassium in the blood 08/26/2015  . Idiopathic peripheral neuropathy 08/26/2015  . Rectocele 08/26/2015  . Midline cystocele 08/26/2015  . Vaginal atrophy 08/26/2015  . Recurrent UTI 08/24/2015  . Microscopic hematuria 08/24/2015  . Atrophic vaginitis 08/24/2015  . Degeneration of intervertebral disc of lumbar region 11/20/2014  . Combined fat and carbohydrate induced hyperlipemia 11/20/2014  . Type 2 diabetes mellitus ([place]) 02/13/2013  . Family history of thyroid disease 02/13/2013  . Hypomagnesemia 02/13/2013  . Degenerative arthritis of hip 02/06/2013    Past Surgical History:  Procedure Laterality Date  . BASAL CELL CARCINOMA EXCISION     face  . bladder tack  2004  . CARDIAC CATHETERIZATION    . COSMETIC SURGERY  2014   Lip  . CYSTOSCOPY W/ RETROGRADES Bilateral 10/04/2015   Procedure: CYSTOSCOPY WITH RETROGRADE PYELOGRAM;  Surgeon: Hollice Espy, MD;  Location: [facility] ORS;  Service: Urology;  Laterality: Bilateral;  . Heart ablation    . KIDNEY STONE SURGERY     stents  . VAGINAL HYSTERECTOMY      Prior to Admission medications   Medication Sig Start Date End Date Taking? Authorizing Provider  aspirin EC 81 MG tablet Take 81 mg by mouth at bedtime.     [provider]  atenolol (TENORMIN) 25 MG tablet Take 25 mg by mouth daily. My take a second 25 mg dose as needed for PVC 09/20/14   [provider]  Calcium Polycarbophil (FIBERCON PO) Take 1 Dose by mouth at bedtime.    [provider]  clonazePAM (KLONOPIN) 0.5 MG tablet Take 0.5 mg by mouth 2 (two) times daily as needed for anxiety. 04/30/18   [provider]  cyclobenzaprine (FLEXERIL) 5 MG tablet Take 5-10 mg by mouth 2 (two) times daily as needed for muscle spasms. 06/03/18   [provider]  docusate sodium (COLACE) 250 MG capsule Take 250 mg by mouth  daily.    [provider]  estradiol (ESTRACE) 0.1 MG/GM vaginal cream Place 1 Applicatorful vaginally every Thursday.    [provider]  fenofibrate (TRICOR) 145 MG tablet Take 145 mg by mouth at bedtime.  08/06/14   [provider]  glucose blood test strip  05/09/13   [provider]  KLOR-CON M20 20 MEQ tablet Take 20 mEq by mouth daily. May take a second 10 meq dose at night as needed for leg cramps 08/06/14   [provider]  losartan (COZAAR) 50 MG tablet Take 50 mg by mouth daily. 09/01/14   [provider]  Magnesium 500 MG CAPS Take 500 mg by mouth daily.     [provider]  Melatonin 5 MG CAPS Take 5 mg by mouth at bedtime as needed (sleep).     [provider]  niacin 500 MG tablet Take 500 mg by mouth at bedtime.    [provider]  nortriptyline (PAMELOR) 75 MG capsule Take 75 mg by mouth at bedtime.  09/15/14   [provider]  oxyCODONE (OXY IR/ROXICODONE) 5 MG immediate release tablet Take 1 tablet (5 mg total) by mouth every 4 (four) hours as needed for moderate pain ((score 4 to 6)). 07/02/18   Newman Pies, MD  polyethylene glycol [facility] Floria Raveling) packet Take 17 g by mouth daily.    [provider]  rOPINIRole (REQUIP) 0.5 MG tablet Take 0.5 mg by mouth at bedtime as needed (restless legs).  03/25/18   [provider]  temazepam (RESTORIL) 15 MG capsule Take 15 mg by mouth daily as needed for sleep (restless legs).  05/02/18   [provider]  triamterene-hydrochlorothiazide (MAXZIDE-25) 37.5-25 MG per tablet Take 1 tablet by mouth every morning. 08/06/14   [provider]    Allergies Ivp dye [iodinated diagnostic agents]; Sulfa antibiotics; Demerol [meperidine]; Flu virus vaccine; Nsaids; Prednisone; and Ketorolac tromethamine  Family History  Problem Relation Age of Onset  . Kidney Stones Father   . Hematuria Father   . Heart failure Father    . Heart failure Mother        Father  . Melanoma Mother   . Diabetes Mother   . Diabetes Sister   . Colon cancer Maternal Aunt   . Breast cancer Maternal Aunt 61       one aunt and one great aunt  . Diabetes Maternal Grandmother   . Ovarian cancer Neg Hx     Social History Social History   Tobacco Use  . Smoking status: Former Smoker    Packs/day: 0.25    Types: Cigarettes    Last attempt to quit: 2000    Years since quitting: 19.9  . Smokeless tobacco: Never Used  . Tobacco comment: quit 20 years  Substance Use Topics  . Alcohol use:  No    Alcohol/week: 0.0 standard drinks  . Drug use: No    Review of Systems Constitutional: No fever/chills Eyes: No visual changes. ENT: No sore throat. Cardiovascular: Denies chest pain. Respiratory: Denies shortness of breath. Gastrointestinal: No abdominal pain.  No nausea, no vomiting.  No diarrhea.  No constipation.  Positive for bright red blood per rectum. Genitourinary: Negative for dysuria. Musculoskeletal: Negative for neck pain.  Negative for back pain. Integumentary: Negative for rash. Neurological: Negative for headaches, focal weakness or numbness.  ____________________________________________   PHYSICAL EXAM:  VITAL SIGNS: ED Triage Vitals [09/05/18 0257]  Enc Vitals Group     BP      Pulse      Resp      Temp      Temp src      SpO2      Weight      Height      Head Circumference      Peak Flow      Pain Score 0     Pain Loc      Pain Edu?      Excl. in [place]?     Constitutional: Alert and oriented. Well appearing and in no acute distress. Eyes: Conjunctivae are normal.  Head: Atraumatic. Mouth/Throat: Mucous membranes are moist.  Oropharynx non-erythematous. Neck: No stridor.   Cardiovascular: Normal rate, regular rhythm. Good peripheral circulation. Grossly normal heart sounds. Respiratory: Normal respiratory effort.  No retractions. Lungs CTAB. Gastrointestinal: Soft and nontender. No  distention.  Hemorrhoids noted at the 5 and 7 o'clock position with evidence of recent bleeding Musculoskeletal: No lower extremity tenderness nor edema. No gross deformities of extremities. Neurologic:  Normal speech and language. No gross focal neurologic deficits are appreciated.  Skin:  Skin is warm, dry and intact. No rash noted. Psychiatric: Mood and affect are normal. Speech and behavior are normal.  ____________________________________________   LABS (all labs ordered are listed, but only abnormal results are displayed)  Labs Reviewed  COMPREHENSIVE METABOLIC PANEL - Abnormal; Notable for the following components:      Result Value   Potassium 3.3 (*)    Glucose, Bld 118 (*)    Creatinine, Ser 1.24 (*)    GFR calc non Af Amer 47 (*)    GFR calc Af Amer 54 (*)    All other components within normal limits  CBC     Procedures   ____________________________________________   INITIAL IMPRESSION / ASSESSMENT AND PLAN / ED COURSE  As part of my medical decision making, I reviewed the following data within the electronic MEDICAL RECORD NUMBER  62 year old female present with above-stated history and physical exam consistent with rectal bleeding secondary to external hemorrhoids.  Patient given Anusol will be prescribed same for home.  ____________________________________________  FINAL CLINICAL IMPRESSION(S) / ED DIAGNOSES  Final diagnoses:  Hemorrhoids, unspecified hemorrhoid type     MEDICATIONS GIVEN DURING THIS VISIT:  Medications  hydrocortisone (ANUSOL-HC) suppository 25 mg (25 mg Rectal Given 09/05/18 0352)     ED Discharge Orders    None       Note:  This document was prepared using Dragon voice recognition software and may include unintentional dictation errors.    Gregor Hams, MD 09/05/18 [phone]

## 2018-09-25 HISTORY — PX: CERVICAL FUSION: SHX112

## 2018-10-07 DIAGNOSIS — F330 Major depressive disorder, recurrent, mild: Secondary | ICD-10-CM | POA: Diagnosis not present

## 2018-10-15 DIAGNOSIS — M48062 Spinal stenosis, lumbar region with neurogenic claudication: Secondary | ICD-10-CM | POA: Diagnosis not present

## 2018-10-15 DIAGNOSIS — I10 Essential (primary) hypertension: Secondary | ICD-10-CM | POA: Diagnosis not present

## 2018-10-15 DIAGNOSIS — M545 Low back pain: Secondary | ICD-10-CM | POA: Diagnosis not present

## 2018-10-15 DIAGNOSIS — Z6833 Body mass index (BMI) 33.0-33.9, adult: Secondary | ICD-10-CM | POA: Diagnosis not present

## 2018-10-28 DIAGNOSIS — M545 Low back pain: Secondary | ICD-10-CM | POA: Diagnosis not present

## 2018-10-28 DIAGNOSIS — R262 Difficulty in walking, not elsewhere classified: Secondary | ICD-10-CM | POA: Diagnosis not present

## 2018-11-05 DIAGNOSIS — R262 Difficulty in walking, not elsewhere classified: Secondary | ICD-10-CM | POA: Diagnosis not present

## 2018-11-05 DIAGNOSIS — M545 Low back pain: Secondary | ICD-10-CM | POA: Diagnosis not present

## 2018-11-11 DIAGNOSIS — J019 Acute sinusitis, unspecified: Secondary | ICD-10-CM | POA: Diagnosis not present

## 2018-11-11 DIAGNOSIS — R05 Cough: Secondary | ICD-10-CM | POA: Diagnosis not present

## 2018-11-11 DIAGNOSIS — B9689 Other specified bacterial agents as the cause of diseases classified elsewhere: Secondary | ICD-10-CM | POA: Diagnosis not present

## 2018-11-13 DIAGNOSIS — L578 Other skin changes due to chronic exposure to nonionizing radiation: Secondary | ICD-10-CM | POA: Diagnosis not present

## 2018-11-18 DIAGNOSIS — R262 Difficulty in walking, not elsewhere classified: Secondary | ICD-10-CM | POA: Diagnosis not present

## 2018-11-18 DIAGNOSIS — M545 Low back pain: Secondary | ICD-10-CM | POA: Diagnosis not present

## 2018-11-20 DIAGNOSIS — M545 Low back pain: Secondary | ICD-10-CM | POA: Diagnosis not present

## 2018-11-20 DIAGNOSIS — R262 Difficulty in walking, not elsewhere classified: Secondary | ICD-10-CM | POA: Diagnosis not present

## 2018-11-25 ENCOUNTER — Other Ambulatory Visit: Payer: Self-pay | Admitting: Family Medicine

## 2018-11-25 DIAGNOSIS — Z1231 Encounter for screening mammogram for malignant neoplasm of breast: Secondary | ICD-10-CM

## 2018-11-28 DIAGNOSIS — R262 Difficulty in walking, not elsewhere classified: Secondary | ICD-10-CM | POA: Diagnosis not present

## 2018-11-28 DIAGNOSIS — M545 Low back pain: Secondary | ICD-10-CM | POA: Diagnosis not present

## 2018-12-02 DIAGNOSIS — R262 Difficulty in walking, not elsewhere classified: Secondary | ICD-10-CM | POA: Diagnosis not present

## 2018-12-02 DIAGNOSIS — M545 Low back pain: Secondary | ICD-10-CM | POA: Diagnosis not present

## 2018-12-04 DIAGNOSIS — R262 Difficulty in walking, not elsewhere classified: Secondary | ICD-10-CM | POA: Diagnosis not present

## 2018-12-04 DIAGNOSIS — M545 Low back pain: Secondary | ICD-10-CM | POA: Diagnosis not present

## 2018-12-09 ENCOUNTER — Other Ambulatory Visit: Payer: Self-pay

## 2018-12-09 ENCOUNTER — Ambulatory Visit
Admission: RE | Admit: 2018-12-09 | Discharge: 2018-12-09 | Disposition: A | Discharge status: Home / Self Care | Payer: PPO | Source: Ambulatory Visit | Attending: Family Medicine | Admitting: Family Medicine

## 2018-12-09 DIAGNOSIS — Z1231 Encounter for screening mammogram for malignant neoplasm of breast: Secondary | ICD-10-CM | POA: Insufficient documentation

## 2018-12-13 DIAGNOSIS — R262 Difficulty in walking, not elsewhere classified: Secondary | ICD-10-CM | POA: Diagnosis not present

## 2018-12-13 DIAGNOSIS — M545 Low back pain: Secondary | ICD-10-CM | POA: Diagnosis not present

## 2018-12-19 DIAGNOSIS — R262 Difficulty in walking, not elsewhere classified: Secondary | ICD-10-CM | POA: Diagnosis not present

## 2018-12-19 DIAGNOSIS — M545 Low back pain: Secondary | ICD-10-CM | POA: Diagnosis not present

## 2018-12-20 DIAGNOSIS — M542 Cervicalgia: Secondary | ICD-10-CM | POA: Diagnosis not present

## 2018-12-30 ENCOUNTER — Other Ambulatory Visit: Payer: Self-pay | Admitting: Neurosurgery

## 2018-12-30 DIAGNOSIS — M47812 Spondylosis without myelopathy or radiculopathy, cervical region: Secondary | ICD-10-CM

## 2018-12-31 DIAGNOSIS — Z794 Long term (current) use of insulin: Secondary | ICD-10-CM | POA: Diagnosis not present

## 2018-12-31 DIAGNOSIS — E1122 Type 2 diabetes mellitus with diabetic chronic kidney disease: Secondary | ICD-10-CM | POA: Diagnosis not present

## 2018-12-31 DIAGNOSIS — E785 Hyperlipidemia, unspecified: Secondary | ICD-10-CM | POA: Diagnosis not present

## 2018-12-31 DIAGNOSIS — Z6835 Body mass index (BMI) 35.0-35.9, adult: Secondary | ICD-10-CM | POA: Diagnosis not present

## 2018-12-31 DIAGNOSIS — N183 Chronic kidney disease, stage 3 unspecified: Secondary | ICD-10-CM | POA: Diagnosis not present

## 2018-12-31 DIAGNOSIS — E1121 Type 2 diabetes mellitus with diabetic nephropathy: Secondary | ICD-10-CM | POA: Diagnosis not present

## 2019-01-09 DIAGNOSIS — M545 Low back pain: Secondary | ICD-10-CM | POA: Diagnosis not present

## 2019-01-09 DIAGNOSIS — R262 Difficulty in walking, not elsewhere classified: Secondary | ICD-10-CM | POA: Diagnosis not present

## 2019-01-17 ENCOUNTER — Ambulatory Visit
Admission: RE | Admit: 2019-01-17 | Discharge: 2019-01-17 | Disposition: A | Discharge status: Home / Self Care | Payer: PPO | Source: Ambulatory Visit | Attending: Neurosurgery | Admitting: Neurosurgery

## 2019-01-17 ENCOUNTER — Other Ambulatory Visit: Payer: Self-pay

## 2019-01-17 DIAGNOSIS — M542 Cervicalgia: Secondary | ICD-10-CM | POA: Diagnosis not present

## 2019-01-17 DIAGNOSIS — M5412 Radiculopathy, cervical region: Secondary | ICD-10-CM | POA: Diagnosis not present

## 2019-01-17 DIAGNOSIS — M47812 Spondylosis without myelopathy or radiculopathy, cervical region: Secondary | ICD-10-CM | POA: Diagnosis not present

## 2019-01-20 DIAGNOSIS — M542 Cervicalgia: Secondary | ICD-10-CM | POA: Diagnosis not present

## 2019-01-20 DIAGNOSIS — M5412 Radiculopathy, cervical region: Secondary | ICD-10-CM | POA: Diagnosis not present

## 2019-01-21 DIAGNOSIS — M48062 Spinal stenosis, lumbar region with neurogenic claudication: Secondary | ICD-10-CM | POA: Diagnosis not present

## 2019-01-21 DIAGNOSIS — M542 Cervicalgia: Secondary | ICD-10-CM | POA: Diagnosis not present

## 2019-01-29 DIAGNOSIS — M5412 Radiculopathy, cervical region: Secondary | ICD-10-CM | POA: Diagnosis not present

## 2019-01-29 DIAGNOSIS — M542 Cervicalgia: Secondary | ICD-10-CM | POA: Diagnosis not present

## 2019-02-04 DIAGNOSIS — M5412 Radiculopathy, cervical region: Secondary | ICD-10-CM | POA: Diagnosis not present

## 2019-02-04 DIAGNOSIS — M542 Cervicalgia: Secondary | ICD-10-CM | POA: Diagnosis not present

## 2019-02-05 DIAGNOSIS — R000 Tachycardia, unspecified: Secondary | ICD-10-CM | POA: Diagnosis not present

## 2019-02-05 DIAGNOSIS — I471 Supraventricular tachycardia: Secondary | ICD-10-CM | POA: Diagnosis not present

## 2019-02-06 DIAGNOSIS — M5412 Radiculopathy, cervical region: Secondary | ICD-10-CM | POA: Diagnosis not present

## 2019-02-06 DIAGNOSIS — M4802 Spinal stenosis, cervical region: Secondary | ICD-10-CM | POA: Diagnosis not present

## 2019-02-06 DIAGNOSIS — M542 Cervicalgia: Secondary | ICD-10-CM | POA: Diagnosis not present

## 2019-02-11 DIAGNOSIS — M542 Cervicalgia: Secondary | ICD-10-CM | POA: Diagnosis not present

## 2019-02-11 DIAGNOSIS — M5412 Radiculopathy, cervical region: Secondary | ICD-10-CM | POA: Diagnosis not present

## 2019-02-13 DIAGNOSIS — M542 Cervicalgia: Secondary | ICD-10-CM | POA: Diagnosis not present

## 2019-02-13 DIAGNOSIS — M5412 Radiculopathy, cervical region: Secondary | ICD-10-CM | POA: Diagnosis not present

## 2019-02-18 DIAGNOSIS — M542 Cervicalgia: Secondary | ICD-10-CM | POA: Diagnosis not present

## 2019-02-18 DIAGNOSIS — M5412 Radiculopathy, cervical region: Secondary | ICD-10-CM | POA: Diagnosis not present

## 2019-02-19 DIAGNOSIS — M542 Cervicalgia: Secondary | ICD-10-CM | POA: Diagnosis not present

## 2019-02-21 DIAGNOSIS — M542 Cervicalgia: Secondary | ICD-10-CM | POA: Diagnosis not present

## 2019-02-21 DIAGNOSIS — M5412 Radiculopathy, cervical region: Secondary | ICD-10-CM | POA: Diagnosis not present

## 2019-02-25 DIAGNOSIS — M542 Cervicalgia: Secondary | ICD-10-CM | POA: Diagnosis not present

## 2019-02-25 DIAGNOSIS — M5412 Radiculopathy, cervical region: Secondary | ICD-10-CM | POA: Diagnosis not present

## 2019-02-27 DIAGNOSIS — M542 Cervicalgia: Secondary | ICD-10-CM | POA: Diagnosis not present

## 2019-03-03 DIAGNOSIS — M542 Cervicalgia: Secondary | ICD-10-CM | POA: Diagnosis not present

## 2019-03-03 DIAGNOSIS — M5412 Radiculopathy, cervical region: Secondary | ICD-10-CM | POA: Diagnosis not present

## 2019-03-05 DIAGNOSIS — M542 Cervicalgia: Secondary | ICD-10-CM | POA: Diagnosis not present

## 2019-03-05 DIAGNOSIS — M5412 Radiculopathy, cervical region: Secondary | ICD-10-CM | POA: Diagnosis not present

## 2019-03-10 DIAGNOSIS — M5412 Radiculopathy, cervical region: Secondary | ICD-10-CM | POA: Diagnosis not present

## 2019-03-10 DIAGNOSIS — M542 Cervicalgia: Secondary | ICD-10-CM | POA: Diagnosis not present

## 2019-03-14 DIAGNOSIS — M5030 Other cervical disc degeneration, unspecified cervical region: Secondary | ICD-10-CM | POA: Diagnosis not present

## 2019-03-20 DIAGNOSIS — M5030 Other cervical disc degeneration, unspecified cervical region: Secondary | ICD-10-CM | POA: Diagnosis not present

## 2019-03-20 DIAGNOSIS — M542 Cervicalgia: Secondary | ICD-10-CM | POA: Diagnosis not present

## 2019-03-20 DIAGNOSIS — M5412 Radiculopathy, cervical region: Secondary | ICD-10-CM | POA: Diagnosis not present

## 2019-03-27 DIAGNOSIS — M5030 Other cervical disc degeneration, unspecified cervical region: Secondary | ICD-10-CM | POA: Diagnosis not present

## 2019-04-01 DIAGNOSIS — M542 Cervicalgia: Secondary | ICD-10-CM | POA: Diagnosis not present

## 2019-04-01 DIAGNOSIS — M5412 Radiculopathy, cervical region: Secondary | ICD-10-CM | POA: Diagnosis not present

## 2019-04-03 DIAGNOSIS — M542 Cervicalgia: Secondary | ICD-10-CM | POA: Diagnosis not present

## 2019-04-03 DIAGNOSIS — M545 Low back pain: Secondary | ICD-10-CM | POA: Diagnosis not present

## 2019-04-03 DIAGNOSIS — M47812 Spondylosis without myelopathy or radiculopathy, cervical region: Secondary | ICD-10-CM | POA: Diagnosis not present

## 2019-04-04 DIAGNOSIS — M542 Cervicalgia: Secondary | ICD-10-CM | POA: Diagnosis not present

## 2019-04-04 DIAGNOSIS — M5412 Radiculopathy, cervical region: Secondary | ICD-10-CM | POA: Diagnosis not present

## 2019-04-09 DIAGNOSIS — M5412 Radiculopathy, cervical region: Secondary | ICD-10-CM | POA: Diagnosis not present

## 2019-04-09 DIAGNOSIS — M7662 Achilles tendinitis, left leg: Secondary | ICD-10-CM | POA: Diagnosis not present

## 2019-04-09 DIAGNOSIS — M542 Cervicalgia: Secondary | ICD-10-CM | POA: Diagnosis not present

## 2019-04-16 DIAGNOSIS — G4733 Obstructive sleep apnea (adult) (pediatric): Secondary | ICD-10-CM | POA: Diagnosis not present

## 2019-04-29 DIAGNOSIS — M5412 Radiculopathy, cervical region: Secondary | ICD-10-CM | POA: Diagnosis not present

## 2019-04-29 DIAGNOSIS — M542 Cervicalgia: Secondary | ICD-10-CM | POA: Diagnosis not present

## 2019-05-01 ENCOUNTER — Encounter: Payer: PPO | Admitting: Obstetrics and Gynecology

## 2019-05-07 DIAGNOSIS — M542 Cervicalgia: Secondary | ICD-10-CM | POA: Diagnosis not present

## 2019-05-07 DIAGNOSIS — M5412 Radiculopathy, cervical region: Secondary | ICD-10-CM | POA: Diagnosis not present

## 2019-05-12 DIAGNOSIS — M47812 Spondylosis without myelopathy or radiculopathy, cervical region: Secondary | ICD-10-CM | POA: Diagnosis not present

## 2019-05-15 DIAGNOSIS — M5412 Radiculopathy, cervical region: Secondary | ICD-10-CM | POA: Diagnosis not present

## 2019-05-15 DIAGNOSIS — M542 Cervicalgia: Secondary | ICD-10-CM | POA: Diagnosis not present

## 2019-05-20 DIAGNOSIS — I10 Essential (primary) hypertension: Secondary | ICD-10-CM | POA: Diagnosis not present

## 2019-05-20 DIAGNOSIS — M542 Cervicalgia: Secondary | ICD-10-CM | POA: Diagnosis not present

## 2019-05-20 DIAGNOSIS — M48062 Spinal stenosis, lumbar region with neurogenic claudication: Secondary | ICD-10-CM | POA: Diagnosis not present

## 2019-05-20 DIAGNOSIS — M4316 Spondylolisthesis, lumbar region: Secondary | ICD-10-CM | POA: Diagnosis not present

## 2019-05-30 DIAGNOSIS — M542 Cervicalgia: Secondary | ICD-10-CM | POA: Diagnosis not present

## 2019-05-30 DIAGNOSIS — M5412 Radiculopathy, cervical region: Secondary | ICD-10-CM | POA: Diagnosis not present

## 2019-06-03 DIAGNOSIS — M5412 Radiculopathy, cervical region: Secondary | ICD-10-CM | POA: Diagnosis not present

## 2019-06-03 DIAGNOSIS — M542 Cervicalgia: Secondary | ICD-10-CM | POA: Diagnosis not present

## 2019-06-10 ENCOUNTER — Telehealth: Payer: Self-pay | Admitting: Obstetrics and Gynecology

## 2019-06-10 DIAGNOSIS — M542 Cervicalgia: Secondary | ICD-10-CM | POA: Diagnosis not present

## 2019-06-10 NOTE — Telephone Encounter (Signed)
Pt called and lvm stating that she needs to reschedule apt. Called pt back to schedule apt had to lvm.

## 2019-06-12 DIAGNOSIS — M5412 Radiculopathy, cervical region: Secondary | ICD-10-CM | POA: Diagnosis not present

## 2019-06-12 DIAGNOSIS — M542 Cervicalgia: Secondary | ICD-10-CM | POA: Diagnosis not present

## 2019-06-19 DIAGNOSIS — M5412 Radiculopathy, cervical region: Secondary | ICD-10-CM | POA: Diagnosis not present

## 2019-06-19 DIAGNOSIS — M542 Cervicalgia: Secondary | ICD-10-CM | POA: Diagnosis not present

## 2019-06-25 ENCOUNTER — Encounter: Payer: PPO | Admitting: Obstetrics and Gynecology

## 2019-06-26 DIAGNOSIS — M542 Cervicalgia: Secondary | ICD-10-CM | POA: Diagnosis not present

## 2019-06-26 DIAGNOSIS — M5412 Radiculopathy, cervical region: Secondary | ICD-10-CM | POA: Diagnosis not present

## 2019-07-01 ENCOUNTER — Ambulatory Visit (INDEPENDENT_AMBULATORY_CARE_PROVIDER_SITE_OTHER): Payer: PPO | Admitting: Obstetrics and Gynecology

## 2019-07-01 ENCOUNTER — Encounter: Payer: Self-pay | Admitting: Obstetrics and Gynecology

## 2019-07-01 ENCOUNTER — Other Ambulatory Visit: Payer: Self-pay

## 2019-07-01 VITALS — BP 128/79 | HR 89 | Ht 68.0 in | Wt 215.7 lb

## 2019-07-01 DIAGNOSIS — N8111 Cystocele, midline: Secondary | ICD-10-CM | POA: Diagnosis not present

## 2019-07-01 DIAGNOSIS — N816 Rectocele: Secondary | ICD-10-CM | POA: Diagnosis not present

## 2019-07-01 NOTE — Progress Notes (Signed)
HPI:      [name] is a 63 y.o. [phone] who LMP was No LMP recorded. Patient has had a hysterectomy.  Subjective:   She presents today to discuss surgery for cystocele rectocele.  She previously had a hysterectomy with anterior repair.  She states that she has issues getting her stream started and that she occasionally splints for bowel movements.  She is frustrated with this process and is considering surgery. She is currently sexually active. She uses estrogen vaginally 1 time per week. She reports no urine loss at this time, uses multiple stool softeners and fiber to keep bowel movements functioning.    Hx: The following portions of the patient's history were reviewed and updated as appropriate:             She  has a past medical history of Anxiety, Arrhythmia, Arthritis, Bulging lumbar disc, Controlled diabetes mellitus ([place]), Cystocele, DDD (degenerative disc disease), cervical, Depression, External hemorrhoids, Facial basal cell cancer, Fibromyalgia, Heart attack ([place]), High cholesterol, History of kidney stones, HTN (hypertension), Rectocele, Recurrent UTI, Sleep apnea, Spleen anomaly, and SVT (supraventricular tachycardia) ([place]). She does not have any pertinent problems on file. She  has a past surgical history that includes Heart ablation; Kidney stone surgery; Cosmetic surgery (2014); Excision basal cell carcinoma; bladder tack (2004); Vaginal hysterectomy; Cystoscopy w/ retrogrades (Bilateral, 10/04/2015); and Cardiac catheterization. Her family history includes Breast cancer (age of onset: 21) in her maternal aunt; Colon cancer in her maternal aunt; Diabetes in her maternal grandmother, mother, and sister; Heart failure in her father and mother; Hematuria in her father; Kidney Stones in her father; Melanoma in her mother. She  reports that she quit smoking about 20 years ago. Her smoking use included cigarettes. She smoked 0.25 packs per day. She has never used smokeless  tobacco. She reports that she does not drink alcohol or use drugs. She has a current medication list which includes the following prescription(s): aspirin ec, atenolol, calcium polycarbophil, clonazepam, docusate sodium, estradiol, fenofibrate, glucose blood, klor-con m20, losartan, magnesium, melatonin, niacin, nortriptyline, polyethylene glycol, ropinirole, triamterene-hydrochlorothiazide, cyclobenzaprine, oxycodone, and temazepam. She is allergic to ivp dye [iodinated diagnostic agents]; sulfa antibiotics; demerol [meperidine]; flu virus vaccine; nsaids; prednisone; and ketorolac tromethamine.       Review of Systems:  Review of Systems  Constitutional: Denied constitutional symptoms, night sweats, recent illness, fatigue, fever, insomnia and weight loss.  Eyes: Denied eye symptoms, eye pain, photophobia, vision change and visual disturbance.  Ears/Nose/Throat/Neck: Denied ear, nose, throat or neck symptoms, hearing loss, nasal discharge, sinus congestion and sore throat.  Cardiovascular: Denied cardiovascular symptoms, arrhythmia, chest pain/pressure, edema, exercise intolerance, orthopnea and palpitations.  Respiratory: Denied pulmonary symptoms, asthma, pleuritic pain, productive sputum, cough, dyspnea and wheezing.  Gastrointestinal: Denied, gastro-esophageal reflux, melena, nausea and vomiting.  Genitourinary: See HPI for additional information.  Musculoskeletal: Denied musculoskeletal symptoms, stiffness, swelling, muscle weakness and myalgia.  Dermatologic: Denied dermatology symptoms, rash and scar.  Neurologic: Denied neurology symptoms, dizziness, headache, neck pain and syncope.  Psychiatric: Denied psychiatric symptoms, anxiety and depression.  Endocrine: Denied endocrine symptoms including hot flashes and night sweats.   Meds:   Current Outpatient Medications on File Prior to Visit  Medication Sig Dispense Refill  . aspirin EC 81 MG tablet Take 81 mg by mouth at bedtime.      Marland Kitchen atenolol (TENORMIN) 25 MG tablet Take 25 mg by mouth daily. My take a second 25 mg dose as needed for PVC    .  Calcium Polycarbophil (FIBERCON PO) Take 1 Dose by mouth at bedtime.    . clonazePAM (KLONOPIN) 0.5 MG tablet Take 0.5 mg by mouth 2 (two) times daily as needed for anxiety.  0  . docusate sodium (COLACE) 250 MG capsule Take 250 mg by mouth daily.    Marland Kitchen estradiol (ESTRACE) 0.1 MG/GM vaginal cream Place 1 Applicatorful vaginally every Thursday.    . fenofibrate (TRICOR) 145 MG tablet Take 145 mg by mouth at bedtime.   4  . glucose blood test strip     . KLOR-CON M20 20 MEQ tablet Take 20 mEq by mouth daily. May take a second 10 meq dose at night as needed for leg cramps  3  . losartan (COZAAR) 50 MG tablet Take 50 mg by mouth daily.  5  . Magnesium 500 MG CAPS Take 500 mg by mouth daily.     . Melatonin 5 MG CAPS Take 5 mg by mouth at bedtime as needed (sleep).     . niacin 500 MG tablet Take 500 mg by mouth at bedtime.    . nortriptyline (PAMELOR) 75 MG capsule Take 75 mg by mouth at bedtime.     . polyethylene glycol (MIRALAX / GLYCOLAX) packet Take 17 g by mouth daily.    Marland Kitchen rOPINIRole (REQUIP) 0.5 MG tablet Take 0.5 mg by mouth at bedtime as needed (restless legs).   2  . triamterene-hydrochlorothiazide (MAXZIDE-25) 37.5-25 MG per tablet Take 1 tablet by mouth every morning.  3  . cyclobenzaprine (FLEXERIL) 5 MG tablet Take 5-10 mg by mouth 2 (two) times daily as needed for muscle spasms.  2  . oxyCODONE (OXY IR/ROXICODONE) 5 MG immediate release tablet Take 1 tablet (5 mg total) by mouth every 4 (four) hours as needed for moderate pain ((score 4 to 6)). (Patient not taking: Reported on 07/01/2019) 30 tablet 0  . temazepam (RESTORIL) 15 MG capsule Take 15 mg by mouth daily as needed for sleep (restless legs).   0   No current facility-administered medications on file prior to visit.     Objective:     Vitals:   07/01/19 0845  BP: 128/79  Pulse: 89     Physical  examination   Pelvic:   Vulva: Normal appearance.  No lesions.  Vagina: No lesions or abnormalities noted.  Support:  Second-degree cystocele first half intact second half significant relaxation.  Third-degree rectocele.  Some vaginal cuff descensus.  Urethra No masses tenderness or scarring.  Meatus Normal size without lesions or prolapse.  Cervix:  Surgically absent  Anus: Normal exam.  No lesions.  Perineum: Normal exam.  No lesions.        Bimanual   Uterus:  Surgically absent  Adnexae: No masses.  Non-tender to palpation.  Cul-de-sac: Negative for abnormality.      Assessment:    [id number] Patient Active Problem List   Diagnosis Date Noted  . Post-operative pain   . Chronic bilateral low back pain with left-sided sciatica   . SVT (supraventricular tachycardia) ([place])   . Diabetes mellitus type 2 in obese ([place])   . Generalized anxiety disorder   . Leukocytosis   . Surgery, elective   . Spondylolisthesis of lumbar region 06/27/2018  . Supraventricular tachycardia ([place]) 11/16/2015  . Incomplete rectal prolapse 11/16/2015  . Adrenal nodule ([place]) 09/12/2015  . Bipolar I disorder ([place]) 08/26/2015  . Essential (primary) hypertension 08/26/2015  . Arthralgia of hip 08/26/2015  . H/O disease 08/26/2015  . Decreased potassium in  the blood 08/26/2015  . Idiopathic peripheral neuropathy 08/26/2015  . Rectocele 08/26/2015  . Midline cystocele 08/26/2015  . Vaginal atrophy 08/26/2015  . Recurrent UTI 08/24/2015  . Microscopic hematuria 08/24/2015  . Atrophic vaginitis 08/24/2015  . Degeneration of intervertebral disc of lumbar region 11/20/2014  . Combined fat and carbohydrate induced hyperlipemia 11/20/2014  . Type 2 diabetes mellitus ([place]) 02/13/2013  . Family history of thyroid disease 02/13/2013  . Hypomagnesemia 02/13/2013  . Degenerative arthritis of hip 02/06/2013     1. Midline cystocele   2. Rectocele     Patient symptomatic with both cystocele and rectocele.   Has difficulty starting her urine stream.  Multiple laxatives to keep bowel functioning.  Patiently occasionally splints both bladder and rectum for function.  Using vaginal estrogen 1 time per week.  Patient sexually active.   Plan:            1.  Options discussed but patient most interested in hearing about surgical options.  Cystocele rectocele discussed urethral sling procedure discussed difficulty of anterior repair with previous anterior repair discussed. Patient to schedule follow-up when she has decided upon management.  Possible preop appointment Orders No orders of the defined types were placed in this encounter.   No orders of the defined types were placed in this encounter.     F/U  No follow-ups on file. I spent 28 minutes involved in the care of this patient of which greater than 50% was spent discussing cystocele rectocele, difficulty with urination and bowel movements, urinary incontinence, use of vaginal estrogen, surgical options.  All questions answered  Finis Bud, M.D. 07/01/2019 9:45 AM

## 2019-07-01 NOTE — Progress Notes (Signed)
Patient comes in today for follow up on her rectocele. She is having trouble emptying her bladder. She would like to discuss surgery.

## 2019-07-03 DIAGNOSIS — Z0000 Encounter for general adult medical examination without abnormal findings: Secondary | ICD-10-CM | POA: Diagnosis not present

## 2019-07-03 DIAGNOSIS — M5136 Other intervertebral disc degeneration, lumbar region: Secondary | ICD-10-CM | POA: Diagnosis not present

## 2019-07-03 DIAGNOSIS — K219 Gastro-esophageal reflux disease without esophagitis: Secondary | ICD-10-CM | POA: Diagnosis not present

## 2019-07-03 DIAGNOSIS — G2581 Restless legs syndrome: Secondary | ICD-10-CM | POA: Diagnosis not present

## 2019-07-03 DIAGNOSIS — E782 Mixed hyperlipidemia: Secondary | ICD-10-CM | POA: Diagnosis not present

## 2019-07-03 DIAGNOSIS — F330 Major depressive disorder, recurrent, mild: Secondary | ICD-10-CM | POA: Diagnosis not present

## 2019-07-03 DIAGNOSIS — G4733 Obstructive sleep apnea (adult) (pediatric): Secondary | ICD-10-CM | POA: Diagnosis not present

## 2019-07-03 DIAGNOSIS — M5412 Radiculopathy, cervical region: Secondary | ICD-10-CM | POA: Diagnosis not present

## 2019-07-03 DIAGNOSIS — I10 Essential (primary) hypertension: Secondary | ICD-10-CM | POA: Diagnosis not present

## 2019-07-03 DIAGNOSIS — E1121 Type 2 diabetes mellitus with diabetic nephropathy: Secondary | ICD-10-CM | POA: Diagnosis not present

## 2019-07-10 DIAGNOSIS — R509 Fever, unspecified: Secondary | ICD-10-CM | POA: Diagnosis not present

## 2019-07-10 DIAGNOSIS — M5412 Radiculopathy, cervical region: Secondary | ICD-10-CM | POA: Diagnosis not present

## 2019-07-15 DIAGNOSIS — M5412 Radiculopathy, cervical region: Secondary | ICD-10-CM | POA: Diagnosis not present

## 2019-07-15 DIAGNOSIS — M542 Cervicalgia: Secondary | ICD-10-CM | POA: Diagnosis not present

## 2019-07-17 DIAGNOSIS — M5412 Radiculopathy, cervical region: Secondary | ICD-10-CM | POA: Diagnosis not present

## 2019-07-17 DIAGNOSIS — M542 Cervicalgia: Secondary | ICD-10-CM | POA: Diagnosis not present

## 2019-07-23 DIAGNOSIS — M542 Cervicalgia: Secondary | ICD-10-CM | POA: Diagnosis not present

## 2019-07-23 DIAGNOSIS — M5412 Radiculopathy, cervical region: Secondary | ICD-10-CM | POA: Diagnosis not present

## 2019-07-24 DIAGNOSIS — M542 Cervicalgia: Secondary | ICD-10-CM | POA: Diagnosis not present

## 2019-08-07 ENCOUNTER — Telehealth: Payer: Self-pay

## 2019-08-07 NOTE — Telephone Encounter (Signed)
Pt called to cancel surgery with Dr. Amalia Hailey.  Pt states she is seing neurology for her neck which is more debilitating and takes priority for now.  Pt request let her know if she needs to contact preop or do anything further to cancel surgery.

## 2019-08-08 DIAGNOSIS — R7989 Other specified abnormal findings of blood chemistry: Secondary | ICD-10-CM | POA: Diagnosis not present

## 2019-08-11 NOTE — Telephone Encounter (Signed)
Pt called to be certain surgery has been canceled as requested on previous call 08/07/19.

## 2019-08-12 ENCOUNTER — Encounter: Payer: PPO | Admitting: Obstetrics and Gynecology

## 2019-08-12 NOTE — Telephone Encounter (Signed)
CM to cancel.

## 2019-08-12 NOTE — Telephone Encounter (Signed)
Pt is having a lot of neck pain d/t a compressed nerve.   She wants to r/s her surgery to a later date. She is uncertain what date. She will call be back when she knows.  I have removed her from the surgery schedule. I moved [place] case up. Thanks.  CM

## 2019-08-28 DIAGNOSIS — D485 Neoplasm of uncertain behavior of skin: Secondary | ICD-10-CM | POA: Diagnosis not present

## 2019-08-28 DIAGNOSIS — M48062 Spinal stenosis, lumbar region with neurogenic claudication: Secondary | ICD-10-CM | POA: Diagnosis not present

## 2019-08-28 DIAGNOSIS — L819 Disorder of pigmentation, unspecified: Secondary | ICD-10-CM | POA: Diagnosis not present

## 2019-08-28 DIAGNOSIS — D229 Melanocytic nevi, unspecified: Secondary | ICD-10-CM | POA: Diagnosis not present

## 2019-08-28 DIAGNOSIS — M542 Cervicalgia: Secondary | ICD-10-CM | POA: Diagnosis not present

## 2019-08-28 DIAGNOSIS — L821 Other seborrheic keratosis: Secondary | ICD-10-CM | POA: Diagnosis not present

## 2019-08-28 DIAGNOSIS — L820 Inflamed seborrheic keratosis: Secondary | ICD-10-CM | POA: Diagnosis not present

## 2019-08-28 DIAGNOSIS — I10 Essential (primary) hypertension: Secondary | ICD-10-CM | POA: Diagnosis not present

## 2019-08-28 DIAGNOSIS — L814 Other melanin hyperpigmentation: Secondary | ICD-10-CM | POA: Diagnosis not present

## 2019-08-28 DIAGNOSIS — Z6833 Body mass index (BMI) 33.0-33.9, adult: Secondary | ICD-10-CM | POA: Diagnosis not present

## 2019-09-01 ENCOUNTER — Inpatient Hospital Stay: Admit: 2019-09-01 | Payer: PPO | Admitting: Obstetrics and Gynecology

## 2019-09-01 SURGERY — ANTERIOR (CYSTOCELE) AND POSTERIOR REPAIR (RECTOCELE)
Anesthesia: General

## 2019-09-20 IMAGING — MR MR LUMBAR SPINE W/O CM
5 series · 33 of 48 positions shown · non-contrast
Comparison: Lumbar radiographs dated 04/09/2018 and abdominal CT
scan dated 08/24/2016 and chest x-ray dated 10/25/2014

CLINICAL DATA: Low back pain and left hip pain.

EXAM:
MRI LUMBAR SPINE WITHOUT CONTRAST
TECHNIQUE: Multiplanar, multisequence MR imaging of the lumbar spine was
performed. No intravenous contrast was administered.

[Series 2: T2 · sagittal · 4.0mm · 0.81mm/px · 5 of 15 slices shown (1 of 2)]
[im 1/15]
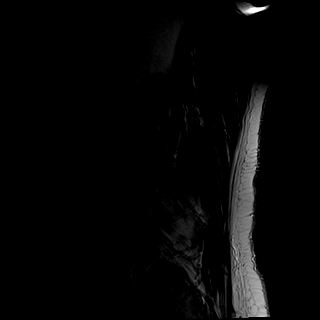
[im 4/15]
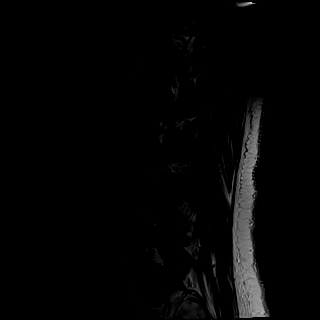
[im 8/15]
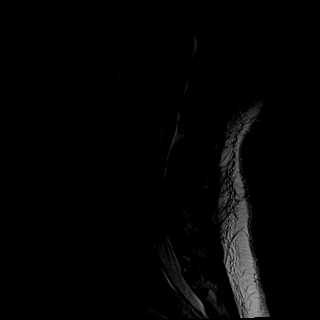
[im 11/15]
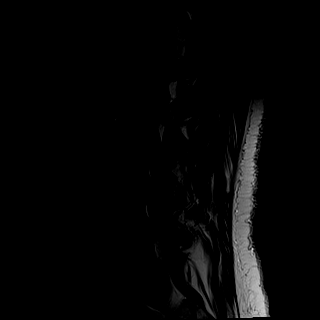
[im 15/15]
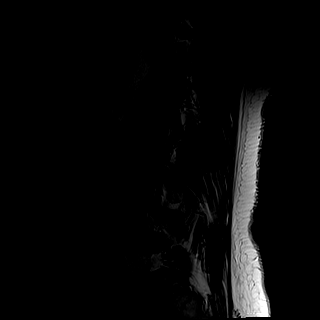

[Series 3: T1 · sagittal · 4.0mm · 0.81mm/px · 5 of 15 slices shown (1 of 2)]
[im 1/15]
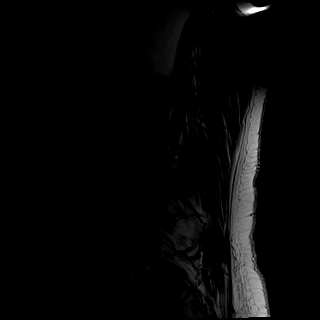
[im 4/15]
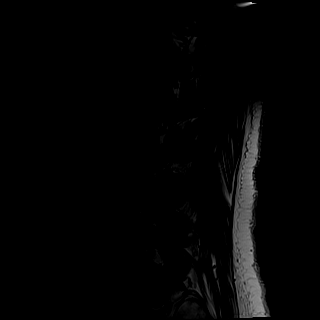
[im 8/15]
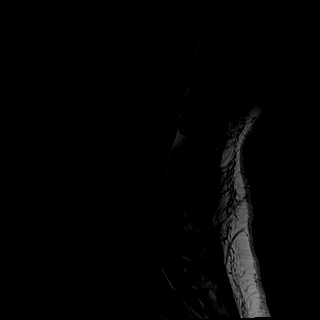
[im 11/15]
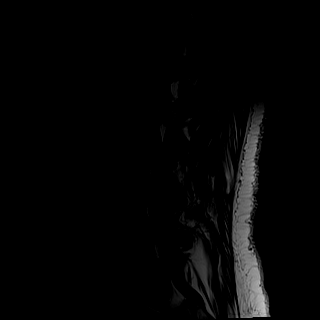
[im 15/15]
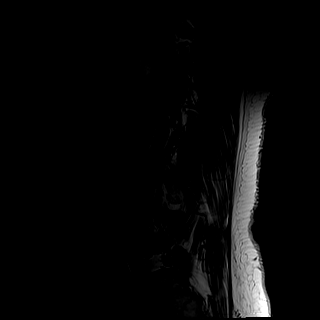

[Series 4: STIR · sagittal · 4.0mm · 0.81mm/px · 3 of 15 slices shown]
[im 1/15]
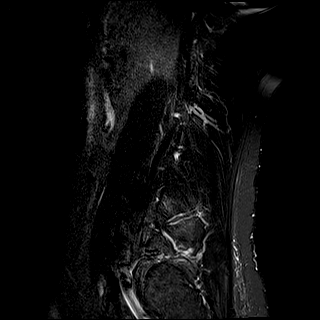
[im 3/15]
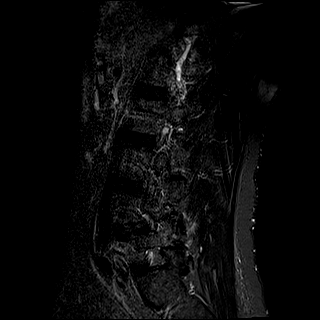
[im 6/15]
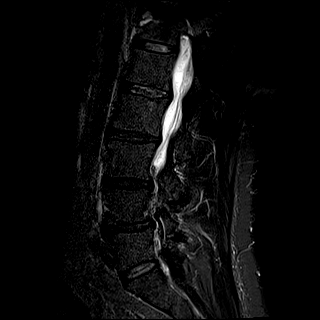

[Series 6: T2 · axial · 4.0mm · 0.78mm/px · z∈[-166,+61]mm · 10 of 42 slices shown (2 of 2)]
[im 3/42]
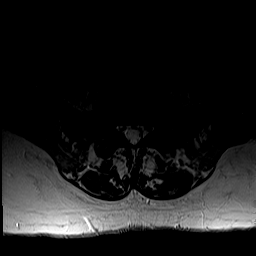
[im 6/42]
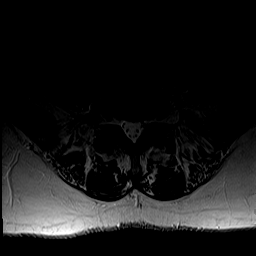
[im 9/42]
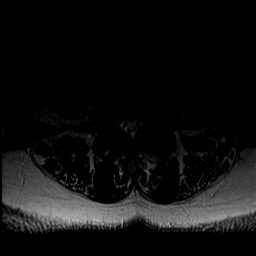
[im 14/42]
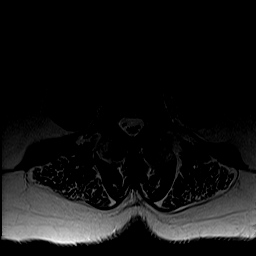
[im 20/42]
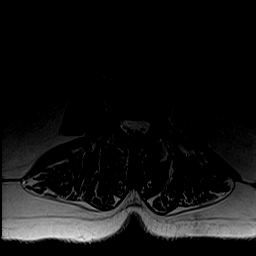
[im 22/42]
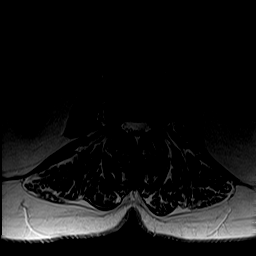
[im 25/42]
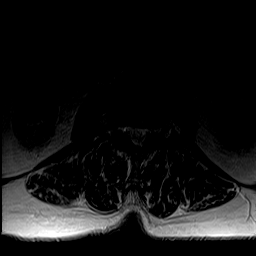
[im 31/42]
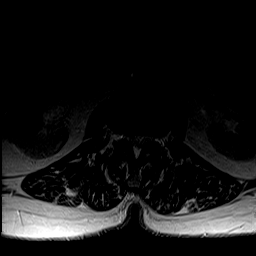
[im 36/42]
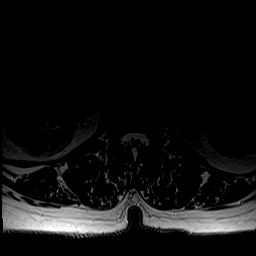
[im 42/42]
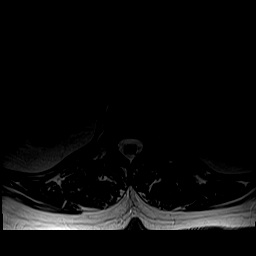

[Series 7: T1 · axial · 4.0mm · 0.39mm/px · z∈[-166,+61]mm · 10 of 42 slices shown (2 of 2)]
[im 3/42]
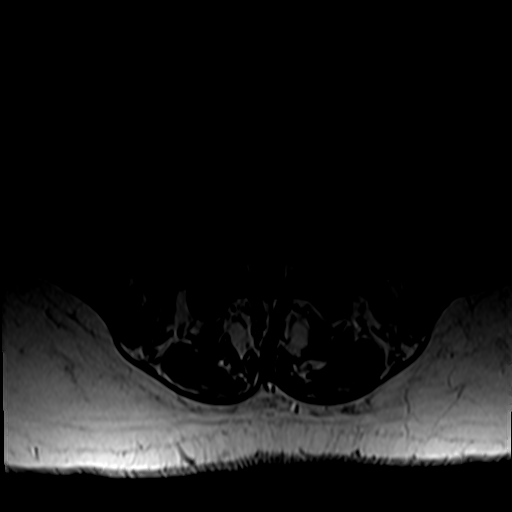
[im 6/42]
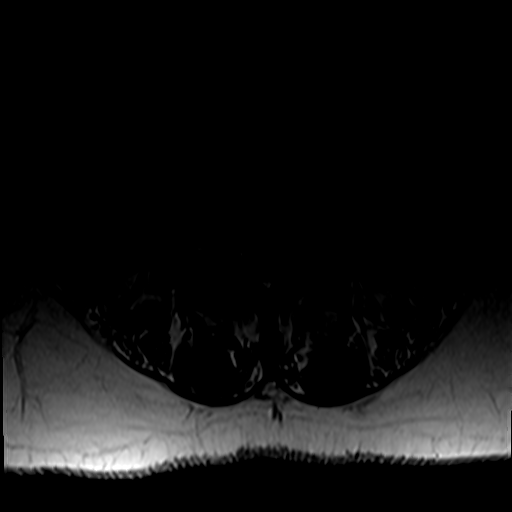
[im 9/42]
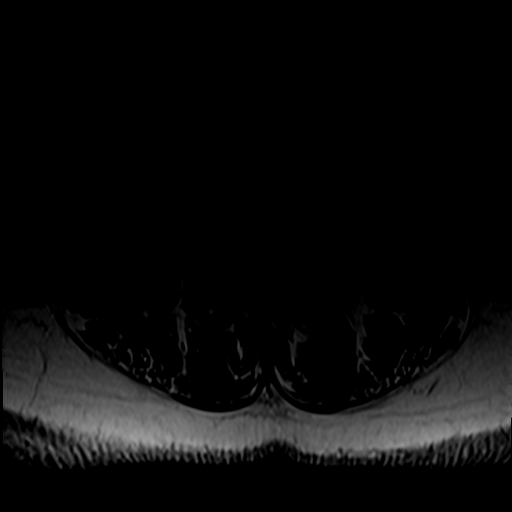
[im 14/42]
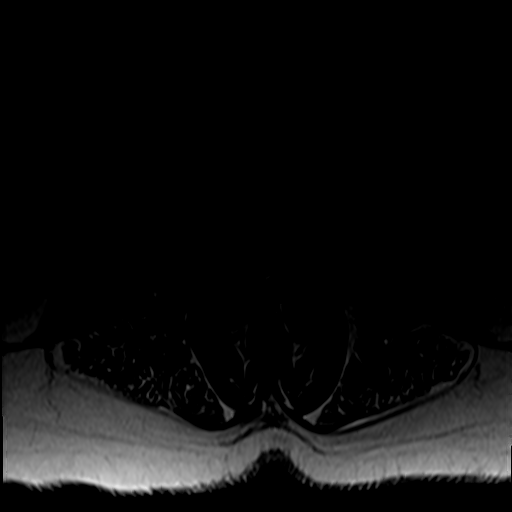
[im 20/42]
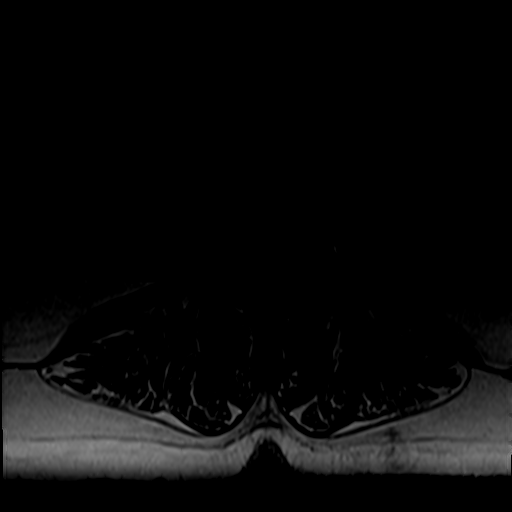
[im 22/42]
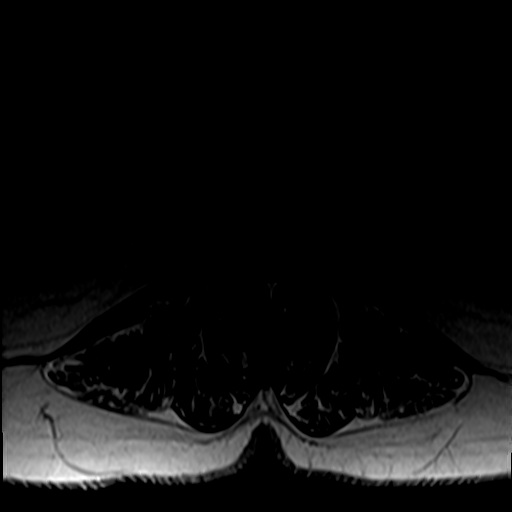
[im 25/42]
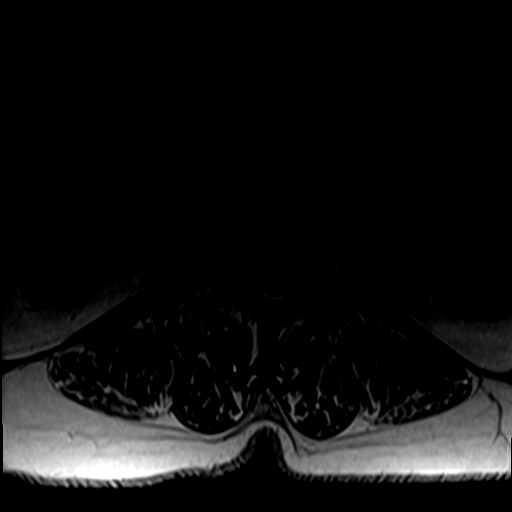
[im 31/42]
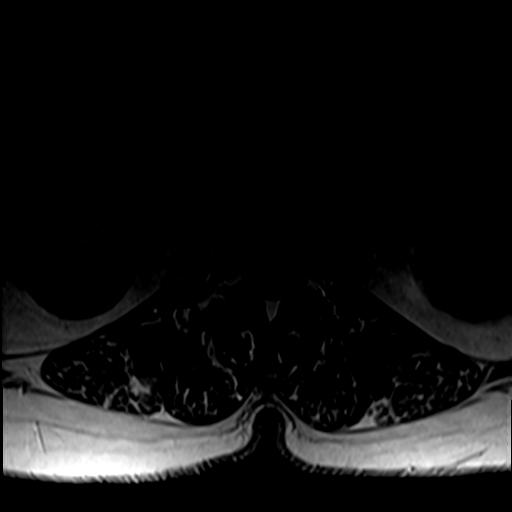
[im 36/42]
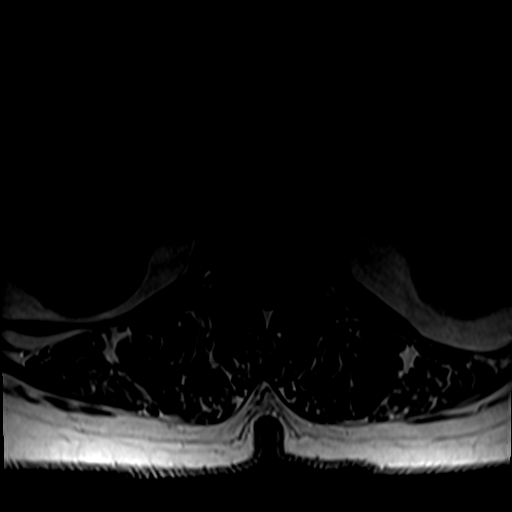
[im 42/42]
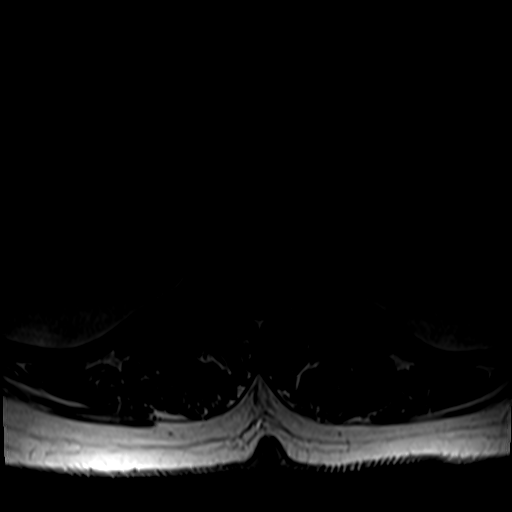

[33 of 48 positions shown; findings below may reference images not displayed]

FINDINGS: Segmentation: The patient has 11 rib-bearing vertebra. There is a
transitional T12 vertebral body. Five typical lumbar segments.

Alignment:  5 mm spondylolisthesis at L4-5.

Vertebrae:  No fracture, evidence of discitis, or bone lesion.

Conus medullaris and cauda equina: Conus extends to the L1-2 level.
Conus and cauda equina appear normal.

Paraspinal and other soft tissues: Negative.

Disc levels:

L1-2: Small broad-based disc bulge. Slight hypertrophy of the
ligamentum flavum. Minimal narrowing of the spinal canal without
focal neural impingement.

L2-3: Small broad-based disc protrusion asymmetric to the right.
Slight hypertrophy of the ligamentum flavum. Minimal narrowing of
the spinal canal without focal neural impingement.

L3-4: Small broad-based disc bulge without neural impingement.
Moderately severe bilateral facet arthritis with hypertrophy of the
facet joints which narrows both lateral recesses slightly and
slightly compresses the thecal sac.

L4-5: Severe bilateral facet arthritis with hypertrophy of the
ligamentum flavum and facet joints. 5 mm spondylolisthesis. This
creates severe spinal stenosis and severe compression of both
lateral recesses, left greater than right which could affect either
or both L5 nerves but more likely the left L5, best seen on images
32 and 33 of series 6. Slight left foraminal stenosis.

L5-S1: Normal disc. Mild to moderate bilateral facet arthritis. No
neural impingement.
IMPRESSION: 1. Severe spinal stenosis and lateral recess impingement at L4-5.
Left lateral recess is more encroached upon than the right lateral
recess.
2. Moderate severe bilateral facet arthritis at L3-4 with slight
narrowing of the lateral recesses and spinal canal.
3. Small broad-based disc protrusion asymmetric to the right at L2-3
without focal neural impingement. Slight narrowing of the spinal
canal.

## 2019-09-27 ENCOUNTER — Other Ambulatory Visit: Payer: Self-pay

## 2019-09-27 ENCOUNTER — Emergency Department: Payer: PPO

## 2019-09-27 ENCOUNTER — Emergency Department
Admission: EM | Admit: 2019-09-27 | Discharge: 2019-09-27 | Disposition: A | Discharge status: Home / Self Care | Payer: PPO | Attending: Emergency Medicine | Admitting: Emergency Medicine

## 2019-09-27 DIAGNOSIS — Z7982 Long term (current) use of aspirin: Secondary | ICD-10-CM | POA: Diagnosis not present

## 2019-09-27 DIAGNOSIS — I252 Old myocardial infarction: Secondary | ICD-10-CM | POA: Insufficient documentation

## 2019-09-27 DIAGNOSIS — Z87891 Personal history of nicotine dependence: Secondary | ICD-10-CM | POA: Insufficient documentation

## 2019-09-27 DIAGNOSIS — R42 Dizziness and giddiness: Secondary | ICD-10-CM | POA: Diagnosis not present

## 2019-09-27 DIAGNOSIS — R0602 Shortness of breath: Secondary | ICD-10-CM | POA: Diagnosis not present

## 2019-09-27 DIAGNOSIS — E119 Type 2 diabetes mellitus without complications: Secondary | ICD-10-CM | POA: Diagnosis not present

## 2019-09-27 DIAGNOSIS — Z79899 Other long term (current) drug therapy: Secondary | ICD-10-CM | POA: Insufficient documentation

## 2019-09-27 DIAGNOSIS — I10 Essential (primary) hypertension: Secondary | ICD-10-CM | POA: Diagnosis not present

## 2019-09-27 DIAGNOSIS — F419 Anxiety disorder, unspecified: Secondary | ICD-10-CM

## 2019-09-27 LAB — BASIC METABOLIC PANEL
Anion gap: 8 (ref 5–15)
BUN: 20 mg/dL (ref 8–23)
CO2: 29 mmol/L (ref 22–32)
Calcium: 10 mg/dL (ref 8.9–10.3)
Chloride: 103 mmol/L (ref 98–111)
Creatinine, Ser: 1.14 mg/dL — ABNORMAL HIGH (ref 0.44–1.00)
GFR calc Af Amer: 60 mL/min — ABNORMAL LOW (ref >60)
GFR calc non Af Amer: 51 mL/min — ABNORMAL LOW (ref >60)
Glucose, Bld: 140 mg/dL — ABNORMAL HIGH (ref 70–99)
Potassium: 3.3 mmol/L — ABNORMAL LOW (ref 3.5–5.1)
Sodium: 140 mmol/L (ref 135–145)

## 2019-09-27 LAB — TROPONIN I (HIGH SENSITIVITY)
Troponin I (High Sensitivity): 4 ng/L (ref <18)
Troponin I (High Sensitivity): 5 ng/L (ref <18)

## 2019-09-27 LAB — CBC
HCT: 37.6 % (ref 36.0–46.0)
Hemoglobin: 12.8 g/dL (ref 12.0–15.0)
MCH: 28.6 pg (ref 26.0–34.0)
MCHC: 34 g/dL (ref 30.0–36.0)
MCV: 83.9 fL (ref 80.0–100.0)
Platelets: 289 10*3/uL (ref 150–400)
RBC: 4.48 MIL/uL (ref 3.87–5.11)
RDW: 13.4 % (ref 11.5–15.5)
WBC: 8.8 10*3/uL (ref 4.0–10.5)
nRBC: 0 % (ref 0.0–0.2)

## 2019-09-27 NOTE — ED Provider Notes (Signed)
[facility] Emergency Department Provider Note  ____________________________________________  Time seen: Approximately 3:47 AM  I have reviewed the triage vital signs and the nursing notes.   HISTORY  Chief Complaint Shortness of Breath   HPI Erin Campos is a 64 y.o. female with a history of anxiety, depression, fibromyalgia, hyperlipidemia, hypertension, SVT who presents for evaluation of dizziness.  Patient reports that she took her requip and nucynta within 3 hours of each other and about 45 min later she started to fell anxious and dizzy. She reports that she used her home pulse ox and her oxygen was in the 70s and her pulse in the 30s. As soon as she stood up and started to walk around, her sats and pulse returned to normal.  Patient reports having similar episode in the past however that 1 lasted for shorter period of time when taking these 2 medications within less than 5 hours from each other.  She was afraid that it could be a serious medication side effect or something wrong with her lungs or heart which prompted her visit to the emergency room. She said she wanted to be sure it was just maybe some anxiety attack and nothing more serious. She reports that since she arrived to the emergency room she has felt back to normal.  She has no chest pain, no shortness of breath, no cough, no fever.  No syncope.  Past Medical History:  Diagnosis Date  . Anxiety   . Arrhythmia   . Arthritis   . Bulging lumbar disc   . Controlled diabetes mellitus ([place])    Diet controlled  . Cystocele   . DDD (degenerative disc disease), cervical   . Depression   . External hemorrhoids   . Facial basal cell cancer   . Fibromyalgia    Diagnosed in 2010  . Heart attack ([place])    Resulted on EKG -2015  . High cholesterol   . History of kidney stones   . HTN (hypertension)   . Rectocele   . Recurrent UTI   . Sleep apnea    CPAP  . Spleen anomaly    compressed  .  SVT (supraventricular tachycardia) [facility])     Patient Active Problem List   Diagnosis Date Noted  . Post-operative pain   . Chronic bilateral low back pain with left-sided sciatica   . SVT (supraventricular tachycardia) ([place])   . Diabetes mellitus type 2 in obese ([place])   . Generalized anxiety disorder   . Leukocytosis   . Surgery, elective   . Spondylolisthesis of lumbar region 06/27/2018  . Supraventricular tachycardia ([place]) 11/16/2015  . Incomplete rectal prolapse 11/16/2015  . Adrenal nodule ([place]) 09/12/2015  . Bipolar I disorder ([place]) 08/26/2015  . Essential (primary) hypertension 08/26/2015  . Arthralgia of hip 08/26/2015  . H/O disease 08/26/2015  . Decreased potassium in the blood 08/26/2015  . Idiopathic peripheral neuropathy 08/26/2015  . Rectocele 08/26/2015  . Midline cystocele 08/26/2015  . Vaginal atrophy 08/26/2015  . Recurrent UTI 08/24/2015  . Microscopic hematuria 08/24/2015  . Atrophic vaginitis 08/24/2015  . Degeneration of intervertebral disc of lumbar region 11/20/2014  . Combined fat and carbohydrate induced hyperlipemia 11/20/2014  . Type 2 diabetes mellitus ([place]) 02/13/2013  . Family history of thyroid disease 02/13/2013  . Hypomagnesemia 02/13/2013  . Degenerative arthritis of hip 02/06/2013    Past Surgical History:  Procedure Laterality Date  . BASAL CELL CARCINOMA EXCISION     face  .  bladder tack  2004  . CARDIAC CATHETERIZATION    . COSMETIC SURGERY  2014   Lip  . CYSTOSCOPY W/ RETROGRADES Bilateral 10/04/2015   Procedure: CYSTOSCOPY WITH RETROGRADE PYELOGRAM;  Surgeon: Hollice Espy, MD;  Location: [facility] ORS;  Service: Urology;  Laterality: Bilateral;  . Heart ablation    . KIDNEY STONE SURGERY     stents  . VAGINAL HYSTERECTOMY      Prior to Admission medications   Medication Sig Start Date End Date Taking? Authorizing Provider  aspirin EC 81 MG tablet Take 81 mg by mouth at bedtime.     [provider]  atenolol (TENORMIN)  25 MG tablet Take 25 mg by mouth daily. My take a second 25 mg dose as needed for PVC 09/20/14   [provider]  Calcium Polycarbophil (FIBERCON PO) Take 1 Dose by mouth at bedtime.    [provider]  clonazePAM (KLONOPIN) 0.5 MG tablet Take 0.5 mg by mouth 2 (two) times daily as needed for anxiety. 04/30/18   [provider]  cyclobenzaprine (FLEXERIL) 5 MG tablet Take 5-10 mg by mouth 2 (two) times daily as needed for muscle spasms. 06/03/18   [provider]  docusate sodium (COLACE) 250 MG capsule Take 250 mg by mouth daily.    [provider]  estradiol (ESTRACE) 0.1 MG/GM vaginal cream Place 1 Applicatorful vaginally every Thursday.    [provider]  fenofibrate (TRICOR) 145 MG tablet Take 145 mg by mouth at bedtime.  08/06/14   [provider]  glucose blood test strip  05/09/13   [provider]  KLOR-CON M20 20 MEQ tablet Take 20 mEq by mouth daily. May take a second 10 meq dose at night as needed for leg cramps 08/06/14   [provider]  losartan (COZAAR) 50 MG tablet Take 50 mg by mouth daily. 09/01/14   [provider]  Magnesium 500 MG CAPS Take 500 mg by mouth daily.     [provider]  Melatonin 5 MG CAPS Take 5 mg by mouth at bedtime as needed (sleep).     [provider]  niacin 500 MG tablet Take 500 mg by mouth at bedtime.    [provider]  nortriptyline (PAMELOR) 75 MG capsule Take 75 mg by mouth at bedtime.  09/15/14   [provider]  oxyCODONE (OXY IR/ROXICODONE) 5 MG immediate release tablet Take 1 tablet (5 mg total) by mouth every 4 (four) hours as needed for moderate pain ((score 4 to 6)). Patient not taking: Reported on 07/01/2019 07/02/18   Newman Pies, MD  polyethylene glycol [facility] / Floria Raveling) packet Take 17 g by mouth daily.    [provider]  rOPINIRole (REQUIP) 0.5 MG tablet Take 0.5 mg by mouth at bedtime as needed (restless  legs).  03/25/18   [provider]  temazepam (RESTORIL) 15 MG capsule Take 15 mg by mouth daily as needed for sleep (restless legs).  05/02/18   [provider]  triamterene-hydrochlorothiazide (MAXZIDE-25) 37.5-25 MG per tablet Take 1 tablet by mouth every morning. 08/06/14   [provider]    Allergies Ivp dye [iodinated diagnostic agents], Sulfa antibiotics, Demerol [meperidine], Flu virus vaccine, Nsaids, Prednisone, and Ketorolac tromethamine  Family History  Problem Relation Age of Onset  . Kidney Stones Father   . Hematuria Father   . Heart failure Father   . Heart failure Mother        Father  . Melanoma Mother   .  Diabetes Mother   . Diabetes Sister   . Colon cancer Maternal Aunt   . Breast cancer Maternal Aunt 69       one aunt and one great aunt  . Diabetes Maternal Grandmother   . Ovarian cancer Neg Hx     Social History Social History   Tobacco Use  . Smoking status: Former Smoker    Packs/day: 0.25    Types: Cigarettes    Quit date: 2000    Years since quitting: 21.0  . Smokeless tobacco: Never Used  . Tobacco comment: quit 20 years  Substance Use Topics  . Alcohol use: No    Alcohol/week: 0.0 standard drinks  . Drug use: No    Review of Systems  Constitutional: Negative for fever. + dizziness, anxious Eyes: Negative for visual changes. ENT: Negative for sore throat. Neck: No neck pain  Cardiovascular: Negative for chest pain. Respiratory: Negative for shortness of breath. Gastrointestinal: Negative for abdominal pain, vomiting or diarrhea. Genitourinary: Negative for dysuria. Musculoskeletal: Negative for back pain. Skin: Negative for rash. Neurological: Negative for headaches, weakness or numbness. Psych: No SI or HI  ____________________________________________   PHYSICAL EXAM:  VITAL SIGNS: ED Triage Vitals  Enc Vitals Group     BP 09/27/19 0016 (!) 184/82     Pulse Rate 09/27/19 0016 87     Resp 09/27/19  0016 20     Temp 09/27/19 0016 97.9 F (36.6 C)     Temp src --      SpO2 09/27/19 0016 100 %     Weight 09/27/19 0017 216 lb (98 kg)     Height 09/27/19 0017 5\' 7"  (1.702 m)     Head Circumference --      Peak Flow --      Pain Score 09/27/19 0016 6     Pain Loc --      Pain Edu? --      Excl. in [place]? --     Constitutional: Alert and oriented. Well appearing and in no apparent distress. HEENT:      Head: Normocephalic and atraumatic.         Eyes: Conjunctivae are normal. Sclera is non-icteric.       Mouth/Throat: Mucous membranes are moist.       Neck: Supple with no signs of meningismus. Cardiovascular: Regular rate and rhythm. No murmurs, gallops, or rubs. 2+ symmetrical distal pulses are present in all extremities. No JVD. Respiratory: Normal respiratory effort. Lungs are clear to auscultation bilaterally. No wheezes, crackles, or rhonchi.  Gastrointestinal: Soft, non tender, and non distended with positive bowel sounds. No rebound or guarding. Musculoskeletal: Nontender with normal range of motion in all extremities. No edema, cyanosis, or erythema of extremities. Neurologic: Normal speech and language. Face is symmetric. Moving all extremities. No gross focal neurologic deficits are appreciated. Skin: Skin is warm, dry and intact. No rash noted. Psychiatric: Mood and affect are normal. Speech and behavior are normal.  ____________________________________________   LABS (all labs ordered are listed, but only abnormal results are displayed)  Labs Reviewed  BASIC METABOLIC PANEL - Abnormal; Notable for the following components:      Result Value   Potassium 3.3 (*)    Glucose, Bld 140 (*)    Creatinine, Ser 1.14 (*)    GFR calc non Af Amer 51 (*)    GFR calc Af Amer 60 (*)    All other components within normal limits  CBC  TROPONIN I (HIGH SENSITIVITY)  TROPONIN I (HIGH SENSITIVITY)   ____________________________________________  EKG ED ECG REPORT I, Rudene Re, the attending physician, personally viewed and interpreted this ECG.  Normal sinus rhythm, rate of 85, normal intervals, left axis deviation, Q waves inferior leads.  No ST elevations or depressions.  Unchanged from prior. ____________________________________________  RADIOLOGY  I have personally reviewed the images performed during this visit and I agree with the Radiologist's read.   Interpretation by Radiologist:  DG Chest 2 View  Result Date: 09/27/2019 CLINICAL DATA:  Shortness of breath. "Slow heart rate". EXAM: CHEST - 2 VIEW COMPARISON:  Chest radiograph 10/25/2014 FINDINGS: The cardiomediastinal contours are normal. The lungs are clear. Pulmonary vasculature is normal. No consolidation, pleural effusion, or pneumothorax. No acute osseous abnormalities are seen. IMPRESSION: Unremarkable radiographs of the chest. Electronically Signed   By: Keith Rake M.D.   On: 09/27/2019 00:53     ____________________________________________   PROCEDURES  Procedure(s) performed: None Procedures Critical Care performed:  None ____________________________________________   INITIAL IMPRESSION / ASSESSMENT AND PLAN / ED COURSE   64 y.o. female with a history of anxiety, depression, fibromyalgia, hyperlipidemia, hypertension, SVT who presents for evaluation of dizziness.  Ddx anxiety vs medication side effect vs dysrhythmia.  Patient is extremely well-appearing in no distress, initially hypertensive however that improved without any intervention.  EKG showing no evidence of dysrhythmias or ischemia.  Troponin x2 -.  Chest x-ray with no evidence of infiltrate, edema, or any other abnormal findings.  Labs with no significant electrolyte derangements, stable creatinine, no leukocytosis or anemia.  Encourage patient to avoid taking the 2 medications close together to avoid the symptoms.  Recommended close follow-up with her primary care doctor.  Patient was monitored on telemetry with  no signs of dysrhythmias, no episodes of hypoxia or bradycardia.       As part of my medical decision making, I reviewed the following data within the [facility] notes reviewed and incorporated, Labs reviewed , EKG interpreted , Old EKG reviewed, Old chart reviewed, Radiograph reviewed , Notes from prior ED visits and [place] Controlled Substance Database   Please note:  Patient was evaluated in Emergency Department today for the symptoms described in the history of present illness. Patient was evaluated in the context of the global COVID-19 pandemic, which necessitated consideration that the patient might be at risk for infection with the SARS-CoV-2 virus that causes COVID-19. Institutional protocols and algorithms that pertain to the evaluation of patients at risk for COVID-19 are in a state of rapid change based on information released by regulatory bodies including the CDC and federal and state organizations. These policies and algorithms were followed during the patient's care in the ED.  Some ED evaluations and interventions may be delayed as a result of limited staffing during the pandemic.   ____________________________________________   FINAL CLINICAL IMPRESSION(S) / ED DIAGNOSES   Final diagnoses:  Anxiety  Dizziness      NEW MEDICATIONS STARTED DURING THIS VISIT:  ED Discharge Orders    None       Note:  This document was prepared using Dragon voice recognition software and may include unintentional dictation errors.    Alfred Levins, [place], MD 09/27/19 [id number]

## 2019-09-27 NOTE — ED Triage Notes (Signed)
Patient reports she is short of breath and having low heart rate and low O2 sat.  Concerned she could be having a reaction to medication.  Reports symptoms started about 40 minutes prior to arrival.

## 2019-10-02 DIAGNOSIS — G4733 Obstructive sleep apnea (adult) (pediatric): Secondary | ICD-10-CM | POA: Diagnosis not present

## 2019-10-03 DIAGNOSIS — H2513 Age-related nuclear cataract, bilateral: Secondary | ICD-10-CM | POA: Diagnosis not present

## 2019-10-20 DIAGNOSIS — M545 Low back pain: Secondary | ICD-10-CM | POA: Diagnosis not present

## 2019-10-20 DIAGNOSIS — M48062 Spinal stenosis, lumbar region with neurogenic claudication: Secondary | ICD-10-CM | POA: Diagnosis not present

## 2019-10-20 DIAGNOSIS — M542 Cervicalgia: Secondary | ICD-10-CM | POA: Diagnosis not present

## 2019-10-22 DIAGNOSIS — M542 Cervicalgia: Secondary | ICD-10-CM | POA: Diagnosis not present

## 2019-10-22 DIAGNOSIS — M545 Low back pain: Secondary | ICD-10-CM | POA: Diagnosis not present

## 2019-10-22 DIAGNOSIS — M48062 Spinal stenosis, lumbar region with neurogenic claudication: Secondary | ICD-10-CM | POA: Diagnosis not present

## 2019-10-27 DIAGNOSIS — M545 Low back pain: Secondary | ICD-10-CM | POA: Diagnosis not present

## 2019-10-27 DIAGNOSIS — M48062 Spinal stenosis, lumbar region with neurogenic claudication: Secondary | ICD-10-CM | POA: Diagnosis not present

## 2019-10-27 DIAGNOSIS — M542 Cervicalgia: Secondary | ICD-10-CM | POA: Diagnosis not present

## 2019-10-28 DIAGNOSIS — G4733 Obstructive sleep apnea (adult) (pediatric): Secondary | ICD-10-CM | POA: Diagnosis not present

## 2019-10-29 DIAGNOSIS — M542 Cervicalgia: Secondary | ICD-10-CM | POA: Diagnosis not present

## 2019-10-29 DIAGNOSIS — M545 Low back pain: Secondary | ICD-10-CM | POA: Diagnosis not present

## 2019-10-29 DIAGNOSIS — M48062 Spinal stenosis, lumbar region with neurogenic claudication: Secondary | ICD-10-CM | POA: Diagnosis not present

## 2019-10-30 DIAGNOSIS — Z20822 Contact with and (suspected) exposure to covid-19: Secondary | ICD-10-CM | POA: Diagnosis not present

## 2019-10-30 DIAGNOSIS — Z20828 Contact with and (suspected) exposure to other viral communicable diseases: Secondary | ICD-10-CM | POA: Diagnosis not present

## 2019-11-03 DIAGNOSIS — M48062 Spinal stenosis, lumbar region with neurogenic claudication: Secondary | ICD-10-CM | POA: Diagnosis not present

## 2019-11-03 DIAGNOSIS — M545 Low back pain: Secondary | ICD-10-CM | POA: Diagnosis not present

## 2019-11-03 DIAGNOSIS — M542 Cervicalgia: Secondary | ICD-10-CM | POA: Diagnosis not present

## 2019-11-05 DIAGNOSIS — M48062 Spinal stenosis, lumbar region with neurogenic claudication: Secondary | ICD-10-CM | POA: Diagnosis not present

## 2019-11-05 DIAGNOSIS — M545 Low back pain: Secondary | ICD-10-CM | POA: Diagnosis not present

## 2019-11-05 DIAGNOSIS — M542 Cervicalgia: Secondary | ICD-10-CM | POA: Diagnosis not present

## 2019-11-11 DIAGNOSIS — M48062 Spinal stenosis, lumbar region with neurogenic claudication: Secondary | ICD-10-CM | POA: Diagnosis not present

## 2019-11-11 DIAGNOSIS — M545 Low back pain: Secondary | ICD-10-CM | POA: Diagnosis not present

## 2019-11-11 DIAGNOSIS — M542 Cervicalgia: Secondary | ICD-10-CM | POA: Diagnosis not present

## 2019-11-14 ENCOUNTER — Telehealth: Payer: Self-pay | Admitting: Obstetrics and Gynecology

## 2019-11-14 DIAGNOSIS — M542 Cervicalgia: Secondary | ICD-10-CM | POA: Diagnosis not present

## 2019-11-14 DIAGNOSIS — M545 Low back pain: Secondary | ICD-10-CM | POA: Diagnosis not present

## 2019-11-14 DIAGNOSIS — M48062 Spinal stenosis, lumbar region with neurogenic claudication: Secondary | ICD-10-CM | POA: Diagnosis not present

## 2019-11-14 MED ORDER — ESTRADIOL 0.1 MG/GM VA CREA: 1.0000 | TOPICAL_CREAM | VAGINAL | 1 refills | Status: AC

## 2019-11-14 NOTE — Telephone Encounter (Signed)
Patient called saying she needs her estradiol  prescription refilled. Could you please advise this patient.  Thank you, Farrell Ours

## 2019-11-14 NOTE — Telephone Encounter (Signed)
Prescription sent to the pharmacy. LM on patients phone to let her know it was sent in.

## 2019-11-20 DIAGNOSIS — M48062 Spinal stenosis, lumbar region with neurogenic claudication: Secondary | ICD-10-CM | POA: Diagnosis not present

## 2019-11-20 DIAGNOSIS — M542 Cervicalgia: Secondary | ICD-10-CM | POA: Diagnosis not present

## 2019-11-20 DIAGNOSIS — M545 Low back pain: Secondary | ICD-10-CM | POA: Diagnosis not present

## 2019-11-21 ENCOUNTER — Other Ambulatory Visit: Payer: Self-pay | Admitting: Neurosurgery

## 2019-11-21 DIAGNOSIS — M5412 Radiculopathy, cervical region: Secondary | ICD-10-CM | POA: Diagnosis not present

## 2019-11-21 DIAGNOSIS — M542 Cervicalgia: Secondary | ICD-10-CM | POA: Diagnosis not present

## 2019-11-25 DIAGNOSIS — M48062 Spinal stenosis, lumbar region with neurogenic claudication: Secondary | ICD-10-CM | POA: Diagnosis not present

## 2019-11-25 DIAGNOSIS — M545 Low back pain: Secondary | ICD-10-CM | POA: Diagnosis not present

## 2019-11-25 DIAGNOSIS — M542 Cervicalgia: Secondary | ICD-10-CM | POA: Diagnosis not present

## 2019-11-30 ENCOUNTER — Other Ambulatory Visit: Payer: Self-pay

## 2019-11-30 ENCOUNTER — Ambulatory Visit
Admission: RE | Admit: 2019-11-30 | Discharge: 2019-11-30 | Disposition: A | Discharge status: Home / Self Care | Payer: PPO | Source: Ambulatory Visit | Attending: Neurosurgery | Admitting: Neurosurgery

## 2019-11-30 DIAGNOSIS — M5412 Radiculopathy, cervical region: Secondary | ICD-10-CM | POA: Diagnosis not present

## 2019-11-30 DIAGNOSIS — M542 Cervicalgia: Secondary | ICD-10-CM | POA: Diagnosis not present

## 2019-12-02 DIAGNOSIS — M5412 Radiculopathy, cervical region: Secondary | ICD-10-CM | POA: Diagnosis not present

## 2019-12-02 DIAGNOSIS — M542 Cervicalgia: Secondary | ICD-10-CM | POA: Diagnosis not present

## 2019-12-02 DIAGNOSIS — M48062 Spinal stenosis, lumbar region with neurogenic claudication: Secondary | ICD-10-CM | POA: Diagnosis not present

## 2019-12-02 DIAGNOSIS — M545 Low back pain: Secondary | ICD-10-CM | POA: Diagnosis not present

## 2019-12-04 DIAGNOSIS — M545 Low back pain: Secondary | ICD-10-CM | POA: Diagnosis not present

## 2019-12-04 DIAGNOSIS — M542 Cervicalgia: Secondary | ICD-10-CM | POA: Diagnosis not present

## 2019-12-04 DIAGNOSIS — M48062 Spinal stenosis, lumbar region with neurogenic claudication: Secondary | ICD-10-CM | POA: Diagnosis not present

## 2019-12-09 DIAGNOSIS — M542 Cervicalgia: Secondary | ICD-10-CM | POA: Diagnosis not present

## 2019-12-09 DIAGNOSIS — M545 Low back pain: Secondary | ICD-10-CM | POA: Diagnosis not present

## 2019-12-09 DIAGNOSIS — I10 Essential (primary) hypertension: Secondary | ICD-10-CM | POA: Diagnosis not present

## 2019-12-09 DIAGNOSIS — M48062 Spinal stenosis, lumbar region with neurogenic claudication: Secondary | ICD-10-CM | POA: Diagnosis not present

## 2019-12-09 DIAGNOSIS — M47812 Spondylosis without myelopathy or radiculopathy, cervical region: Secondary | ICD-10-CM | POA: Diagnosis not present

## 2019-12-09 DIAGNOSIS — Z6833 Body mass index (BMI) 33.0-33.9, adult: Secondary | ICD-10-CM | POA: Diagnosis not present

## 2019-12-11 DIAGNOSIS — M542 Cervicalgia: Secondary | ICD-10-CM | POA: Diagnosis not present

## 2019-12-11 DIAGNOSIS — M545 Low back pain: Secondary | ICD-10-CM | POA: Diagnosis not present

## 2019-12-11 DIAGNOSIS — M48062 Spinal stenosis, lumbar region with neurogenic claudication: Secondary | ICD-10-CM | POA: Diagnosis not present

## 2019-12-16 DIAGNOSIS — M48062 Spinal stenosis, lumbar region with neurogenic claudication: Secondary | ICD-10-CM | POA: Diagnosis not present

## 2019-12-16 DIAGNOSIS — M542 Cervicalgia: Secondary | ICD-10-CM | POA: Diagnosis not present

## 2019-12-16 DIAGNOSIS — M545 Low back pain: Secondary | ICD-10-CM | POA: Diagnosis not present

## 2019-12-17 ENCOUNTER — Other Ambulatory Visit: Payer: Self-pay | Admitting: Neurosurgery

## 2019-12-17 ENCOUNTER — Telehealth: Payer: Self-pay

## 2019-12-17 DIAGNOSIS — M542 Cervicalgia: Secondary | ICD-10-CM

## 2019-12-17 NOTE — Telephone Encounter (Signed)
Phone call to patient to verify medication list and allergies for myelogram procedure. Pt instructed to hold Nortriptyline and Nucyenta for 48hrs prior to myelogram appointment time. Pt reports an allergy to IVP contrast dye. Pt states she received it around 40 years ago and developed hives, heart palpitations, and chest pain. Per Dr. Jeralyn Ruths we will call in a 13 hour prep for the pt and proceed with the procedure. Pt verbalized understanding.  Pt also instructed that she would need a driver the day of the procedure, she would be with [place] around 2 hours the day of, and she would need to lay flat for at least 24 hours after. Pt verbalized understanding and has our contact information if she were to have any questions.

## 2019-12-18 ENCOUNTER — Telehealth: Payer: Self-pay

## 2019-12-18 DIAGNOSIS — M1710 Unilateral primary osteoarthritis, unspecified knee: Secondary | ICD-10-CM | POA: Insufficient documentation

## 2019-12-18 DIAGNOSIS — M7070 Other bursitis of hip, unspecified hip: Secondary | ICD-10-CM | POA: Insufficient documentation

## 2019-12-18 DIAGNOSIS — M5416 Radiculopathy, lumbar region: Secondary | ICD-10-CM | POA: Insufficient documentation

## 2019-12-18 DIAGNOSIS — M1610 Unilateral primary osteoarthritis, unspecified hip: Secondary | ICD-10-CM | POA: Insufficient documentation

## 2019-12-18 DIAGNOSIS — M179 Osteoarthritis of knee, unspecified: Secondary | ICD-10-CM | POA: Insufficient documentation

## 2019-12-18 DIAGNOSIS — M5417 Radiculopathy, lumbosacral region: Secondary | ICD-10-CM | POA: Insufficient documentation

## 2019-12-18 NOTE — Telephone Encounter (Signed)
Spoke with patient to clarify which pharmacy to call for 13hr prep order and to instruct her on how to take it.  Prednisone 50mg  PO 12/22/19 @ 2130, 12/23/19 @ 0330 and 0930.  She also was informed to take Benadrly 50mg  PO 12/23/19 @ 0930.  I noticed while looking at her medication list that Wellbutrin was on a list of meds "to be reconciled."  She confirmed she does take this medication, so I asked her not to take this medication 48 hours before, and 24 hours after, her myelogram, in addition to her Nucynta and Nortriptyline.

## 2019-12-22 ENCOUNTER — Other Ambulatory Visit: Payer: Self-pay | Admitting: Family Medicine

## 2019-12-22 DIAGNOSIS — Z1231 Encounter for screening mammogram for malignant neoplasm of breast: Secondary | ICD-10-CM

## 2019-12-23 ENCOUNTER — Other Ambulatory Visit: Payer: Self-pay

## 2019-12-23 ENCOUNTER — Inpatient Hospital Stay: Admission: RE | Admit: 2019-12-23 | Payer: PPO | Source: Ambulatory Visit

## 2019-12-23 ENCOUNTER — Ambulatory Visit
Admission: RE | Admit: 2019-12-23 | Discharge: 2019-12-23 | Disposition: A | Discharge status: Home / Self Care | Payer: PPO | Source: Ambulatory Visit | Attending: Neurosurgery | Admitting: Neurosurgery

## 2019-12-23 ENCOUNTER — Telehealth: Payer: Self-pay | Admitting: Radiology

## 2019-12-23 DIAGNOSIS — M542 Cervicalgia: Secondary | ICD-10-CM | POA: Diagnosis not present

## 2019-12-23 MED ORDER — DIAZEPAM 5 MG PO TABS
10.0000 mg | ORAL_TABLET | Freq: Once | ORAL | Status: AC
  Administered 2019-12-23: 10 mg via ORAL

## 2019-12-23 MED ORDER — IOPAMIDOL (ISOVUE-M 300) INJECTION 61%
10.0000 mL | Freq: Once | INTRAMUSCULAR | Status: AC | PRN
  Administered 2019-12-23: 10 mL via INTRATHECAL

## 2019-12-23 NOTE — Discharge Instructions (Signed)
Myelogram Discharge Instructions  1. Go home and rest quietly for the next 24 hours.  It is important to lie flat for the next 24 hours.  Get up only to go to the restroom.  You may lie in the bed or on a couch on your back, your stomach, your left side or your right side.  You may have one pillow under your head.  You may have pillows between your knees while you are on your side or under your knees while you are on your back.  2. DO NOT drive today.  Recline the seat as far back as it will go, while still wearing your seat belt, on the way home.  3. You may get up to go to the bathroom as needed.  You may sit up for 10 minutes to eat.  You may resume your normal diet and medications unless otherwise indicated.  Drink lots of extra fluids today and tomorrow.  4. The incidence of headache, nausea, or vomiting is about 5% (one in 20 patients).  If you develop a headache, lie flat and drink plenty of fluids until the headache goes away.  Caffeinated beverages may be helpful.  If you develop severe nausea and vomiting or a headache that does not go away with flat bed rest, call [phone].  5. You may resume normal activities after your 24 hours of bed rest is over; however, do not exert yourself strongly or do any heavy lifting tomorrow. If when you get up you have a headache when standing, go back to bed and force fluids for another 24 hours.  6. Call your physician for a follow-up appointment.  The results of your myelogram will be sent directly to your physician by the following day.  7. If you have any questions or if complications develop after you arrive home, please call [phone].  Discharge instructions have been explained to the patient.  The patient, or the person responsible for the patient, fully understands these instructions.  YOU MAY RESTART YOUR NORTRIPTYLINE, NUCYNTA AND WELLBUTRIN TOMORROW 12/24/2019 AT 10:30AM.

## 2019-12-23 NOTE — Progress Notes (Signed)
I returned a phone call from the patient at 9:40pm. She had a myelogram this morning which she reports went well. This evening she reports some variation in her heart rate (slower while lying down and returning to normal upon standing/walking) which is similar to an episode in January which prompted an ER visit. Cardiac workup was negative at that time. Tonight she denies any associated SOB, chest pain, dizziness, or other symptoms and does not personally feel that this warrants a return to the ER. I reassured the patient that this was unlikely a reaction to the myelogram as she has no symptoms of an allergic reaction (she underwent steroid prep due to a remote history of reaction to iodinated contrast). We discussed continuing to monitor at home, and that if she were to develop new symptoms (such as those listed above) that at that point I would recommend that she go to the ER for evaluation. She voiced understanding.

## 2019-12-23 NOTE — Progress Notes (Signed)
Pt has been off her nortriptyline, nucynta and wellbutrin for 48hrs for her myelogram procedure today. Pt has also taken her prednisone and benadryl 13 hr prep for her contrast allergy.

## 2019-12-30 DIAGNOSIS — M542 Cervicalgia: Secondary | ICD-10-CM | POA: Diagnosis not present

## 2019-12-30 DIAGNOSIS — M48062 Spinal stenosis, lumbar region with neurogenic claudication: Secondary | ICD-10-CM | POA: Diagnosis not present

## 2019-12-30 DIAGNOSIS — M545 Low back pain: Secondary | ICD-10-CM | POA: Diagnosis not present

## 2020-01-06 ENCOUNTER — Ambulatory Visit
Admission: RE | Admit: 2020-01-06 | Discharge: 2020-01-06 | Disposition: A | Discharge status: Home / Self Care | Payer: PPO | Source: Ambulatory Visit | Attending: Family Medicine | Admitting: Family Medicine

## 2020-01-06 ENCOUNTER — Other Ambulatory Visit: Payer: Self-pay

## 2020-01-06 DIAGNOSIS — M542 Cervicalgia: Secondary | ICD-10-CM | POA: Diagnosis not present

## 2020-01-06 DIAGNOSIS — M48062 Spinal stenosis, lumbar region with neurogenic claudication: Secondary | ICD-10-CM | POA: Diagnosis not present

## 2020-01-06 DIAGNOSIS — M545 Low back pain: Secondary | ICD-10-CM | POA: Diagnosis not present

## 2020-01-06 DIAGNOSIS — Z1231 Encounter for screening mammogram for malignant neoplasm of breast: Secondary | ICD-10-CM | POA: Diagnosis not present

## 2020-01-07 DIAGNOSIS — M1711 Unilateral primary osteoarthritis, right knee: Secondary | ICD-10-CM | POA: Diagnosis not present

## 2020-01-07 DIAGNOSIS — M65321 Trigger finger, right index finger: Secondary | ICD-10-CM | POA: Diagnosis not present

## 2020-01-08 DIAGNOSIS — M545 Low back pain: Secondary | ICD-10-CM | POA: Diagnosis not present

## 2020-01-08 DIAGNOSIS — M542 Cervicalgia: Secondary | ICD-10-CM | POA: Diagnosis not present

## 2020-01-08 DIAGNOSIS — M48062 Spinal stenosis, lumbar region with neurogenic claudication: Secondary | ICD-10-CM | POA: Diagnosis not present

## 2020-01-13 DIAGNOSIS — M48062 Spinal stenosis, lumbar region with neurogenic claudication: Secondary | ICD-10-CM | POA: Diagnosis not present

## 2020-01-13 DIAGNOSIS — M545 Low back pain: Secondary | ICD-10-CM | POA: Diagnosis not present

## 2020-01-13 DIAGNOSIS — M542 Cervicalgia: Secondary | ICD-10-CM | POA: Diagnosis not present

## 2020-01-15 DIAGNOSIS — M48062 Spinal stenosis, lumbar region with neurogenic claudication: Secondary | ICD-10-CM | POA: Diagnosis not present

## 2020-01-15 DIAGNOSIS — M545 Low back pain: Secondary | ICD-10-CM | POA: Diagnosis not present

## 2020-01-15 DIAGNOSIS — M542 Cervicalgia: Secondary | ICD-10-CM | POA: Diagnosis not present

## 2020-01-16 DIAGNOSIS — Z6833 Body mass index (BMI) 33.0-33.9, adult: Secondary | ICD-10-CM | POA: Diagnosis not present

## 2020-01-16 DIAGNOSIS — M542 Cervicalgia: Secondary | ICD-10-CM | POA: Diagnosis not present

## 2020-01-20 DIAGNOSIS — M542 Cervicalgia: Secondary | ICD-10-CM | POA: Diagnosis not present

## 2020-01-20 DIAGNOSIS — M48062 Spinal stenosis, lumbar region with neurogenic claudication: Secondary | ICD-10-CM | POA: Diagnosis not present

## 2020-01-20 DIAGNOSIS — M545 Low back pain: Secondary | ICD-10-CM | POA: Diagnosis not present

## 2020-01-22 DIAGNOSIS — M48062 Spinal stenosis, lumbar region with neurogenic claudication: Secondary | ICD-10-CM | POA: Diagnosis not present

## 2020-01-22 DIAGNOSIS — M545 Low back pain: Secondary | ICD-10-CM | POA: Diagnosis not present

## 2020-01-22 DIAGNOSIS — M542 Cervicalgia: Secondary | ICD-10-CM | POA: Diagnosis not present

## 2020-01-22 DIAGNOSIS — M47812 Spondylosis without myelopathy or radiculopathy, cervical region: Secondary | ICD-10-CM | POA: Diagnosis not present

## 2020-01-26 DIAGNOSIS — G4733 Obstructive sleep apnea (adult) (pediatric): Secondary | ICD-10-CM | POA: Diagnosis not present

## 2020-01-26 DIAGNOSIS — E1121 Type 2 diabetes mellitus with diabetic nephropathy: Secondary | ICD-10-CM | POA: Diagnosis not present

## 2020-01-26 DIAGNOSIS — M5412 Radiculopathy, cervical region: Secondary | ICD-10-CM | POA: Diagnosis not present

## 2020-01-26 DIAGNOSIS — E782 Mixed hyperlipidemia: Secondary | ICD-10-CM | POA: Diagnosis not present

## 2020-01-26 DIAGNOSIS — K219 Gastro-esophageal reflux disease without esophagitis: Secondary | ICD-10-CM | POA: Diagnosis not present

## 2020-01-26 DIAGNOSIS — M5136 Other intervertebral disc degeneration, lumbar region: Secondary | ICD-10-CM | POA: Diagnosis not present

## 2020-01-26 DIAGNOSIS — F330 Major depressive disorder, recurrent, mild: Secondary | ICD-10-CM | POA: Diagnosis not present

## 2020-01-26 DIAGNOSIS — G2581 Restless legs syndrome: Secondary | ICD-10-CM | POA: Diagnosis not present

## 2020-01-26 DIAGNOSIS — I10 Essential (primary) hypertension: Secondary | ICD-10-CM | POA: Diagnosis not present

## 2020-01-27 DIAGNOSIS — M545 Low back pain: Secondary | ICD-10-CM | POA: Diagnosis not present

## 2020-01-27 DIAGNOSIS — M48062 Spinal stenosis, lumbar region with neurogenic claudication: Secondary | ICD-10-CM | POA: Diagnosis not present

## 2020-01-27 DIAGNOSIS — M542 Cervicalgia: Secondary | ICD-10-CM | POA: Diagnosis not present

## 2020-01-27 DIAGNOSIS — E1121 Type 2 diabetes mellitus with diabetic nephropathy: Secondary | ICD-10-CM | POA: Diagnosis not present

## 2020-01-27 DIAGNOSIS — E782 Mixed hyperlipidemia: Secondary | ICD-10-CM | POA: Diagnosis not present

## 2020-02-10 DIAGNOSIS — M545 Low back pain: Secondary | ICD-10-CM | POA: Diagnosis not present

## 2020-02-10 DIAGNOSIS — M542 Cervicalgia: Secondary | ICD-10-CM | POA: Diagnosis not present

## 2020-02-10 DIAGNOSIS — M48062 Spinal stenosis, lumbar region with neurogenic claudication: Secondary | ICD-10-CM | POA: Diagnosis not present

## 2020-02-12 DIAGNOSIS — M48062 Spinal stenosis, lumbar region with neurogenic claudication: Secondary | ICD-10-CM | POA: Diagnosis not present

## 2020-02-12 DIAGNOSIS — M542 Cervicalgia: Secondary | ICD-10-CM | POA: Diagnosis not present

## 2020-02-12 DIAGNOSIS — M545 Low back pain: Secondary | ICD-10-CM | POA: Diagnosis not present

## 2020-02-17 DIAGNOSIS — M542 Cervicalgia: Secondary | ICD-10-CM | POA: Diagnosis not present

## 2020-02-17 DIAGNOSIS — M48062 Spinal stenosis, lumbar region with neurogenic claudication: Secondary | ICD-10-CM | POA: Diagnosis not present

## 2020-02-17 DIAGNOSIS — M545 Low back pain: Secondary | ICD-10-CM | POA: Diagnosis not present

## 2020-02-18 DIAGNOSIS — M79641 Pain in right hand: Secondary | ICD-10-CM | POA: Diagnosis not present

## 2020-02-18 DIAGNOSIS — G8918 Other acute postprocedural pain: Secondary | ICD-10-CM | POA: Diagnosis not present

## 2020-02-18 DIAGNOSIS — M47812 Spondylosis without myelopathy or radiculopathy, cervical region: Secondary | ICD-10-CM | POA: Diagnosis not present

## 2020-02-18 DIAGNOSIS — M542 Cervicalgia: Secondary | ICD-10-CM | POA: Diagnosis not present

## 2020-02-18 DIAGNOSIS — R200 Anesthesia of skin: Secondary | ICD-10-CM | POA: Diagnosis not present

## 2020-02-18 DIAGNOSIS — M25512 Pain in left shoulder: Secondary | ICD-10-CM | POA: Diagnosis not present

## 2020-02-18 DIAGNOSIS — M25522 Pain in left elbow: Secondary | ICD-10-CM | POA: Diagnosis not present

## 2020-02-18 DIAGNOSIS — M79642 Pain in left hand: Secondary | ICD-10-CM | POA: Diagnosis not present

## 2020-02-22 DIAGNOSIS — R509 Fever, unspecified: Secondary | ICD-10-CM | POA: Diagnosis not present

## 2020-02-22 DIAGNOSIS — J069 Acute upper respiratory infection, unspecified: Secondary | ICD-10-CM | POA: Diagnosis not present

## 2020-02-22 DIAGNOSIS — Z20822 Contact with and (suspected) exposure to covid-19: Secondary | ICD-10-CM | POA: Diagnosis not present

## 2020-03-02 DIAGNOSIS — Z79891 Long term (current) use of opiate analgesic: Secondary | ICD-10-CM | POA: Diagnosis not present

## 2020-03-02 DIAGNOSIS — Z5181 Encounter for therapeutic drug level monitoring: Secondary | ICD-10-CM | POA: Diagnosis not present

## 2020-03-02 DIAGNOSIS — M47812 Spondylosis without myelopathy or radiculopathy, cervical region: Secondary | ICD-10-CM | POA: Diagnosis not present

## 2020-03-02 DIAGNOSIS — Z79899 Other long term (current) drug therapy: Secondary | ICD-10-CM | POA: Diagnosis not present

## 2020-03-08 DIAGNOSIS — M5412 Radiculopathy, cervical region: Secondary | ICD-10-CM | POA: Diagnosis not present

## 2020-03-08 DIAGNOSIS — E782 Mixed hyperlipidemia: Secondary | ICD-10-CM | POA: Diagnosis not present

## 2020-03-08 DIAGNOSIS — E6609 Other obesity due to excess calories: Secondary | ICD-10-CM | POA: Diagnosis not present

## 2020-03-08 DIAGNOSIS — F411 Generalized anxiety disorder: Secondary | ICD-10-CM | POA: Diagnosis not present

## 2020-03-08 DIAGNOSIS — K219 Gastro-esophageal reflux disease without esophagitis: Secondary | ICD-10-CM | POA: Diagnosis not present

## 2020-03-08 DIAGNOSIS — I10 Essential (primary) hypertension: Secondary | ICD-10-CM | POA: Diagnosis not present

## 2020-03-08 DIAGNOSIS — I471 Supraventricular tachycardia: Secondary | ICD-10-CM | POA: Diagnosis not present

## 2020-03-08 DIAGNOSIS — E1121 Type 2 diabetes mellitus with diabetic nephropathy: Secondary | ICD-10-CM | POA: Diagnosis not present

## 2020-03-08 DIAGNOSIS — Z6834 Body mass index (BMI) 34.0-34.9, adult: Secondary | ICD-10-CM | POA: Diagnosis not present

## 2020-03-08 DIAGNOSIS — N189 Chronic kidney disease, unspecified: Secondary | ICD-10-CM | POA: Diagnosis not present

## 2020-03-08 DIAGNOSIS — G4733 Obstructive sleep apnea (adult) (pediatric): Secondary | ICD-10-CM | POA: Diagnosis not present

## 2020-03-09 DIAGNOSIS — M545 Low back pain: Secondary | ICD-10-CM | POA: Diagnosis not present

## 2020-03-09 DIAGNOSIS — M542 Cervicalgia: Secondary | ICD-10-CM | POA: Diagnosis not present

## 2020-03-09 DIAGNOSIS — M48062 Spinal stenosis, lumbar region with neurogenic claudication: Secondary | ICD-10-CM | POA: Diagnosis not present

## 2020-03-24 DIAGNOSIS — M48062 Spinal stenosis, lumbar region with neurogenic claudication: Secondary | ICD-10-CM | POA: Diagnosis not present

## 2020-03-24 DIAGNOSIS — M542 Cervicalgia: Secondary | ICD-10-CM | POA: Diagnosis not present

## 2020-03-24 DIAGNOSIS — M545 Low back pain: Secondary | ICD-10-CM | POA: Diagnosis not present

## 2020-03-26 DIAGNOSIS — M542 Cervicalgia: Secondary | ICD-10-CM | POA: Diagnosis not present

## 2020-03-26 DIAGNOSIS — M545 Low back pain: Secondary | ICD-10-CM | POA: Diagnosis not present

## 2020-03-26 DIAGNOSIS — M48062 Spinal stenosis, lumbar region with neurogenic claudication: Secondary | ICD-10-CM | POA: Diagnosis not present

## 2020-03-31 DIAGNOSIS — M48062 Spinal stenosis, lumbar region with neurogenic claudication: Secondary | ICD-10-CM | POA: Diagnosis not present

## 2020-03-31 DIAGNOSIS — M542 Cervicalgia: Secondary | ICD-10-CM | POA: Diagnosis not present

## 2020-03-31 DIAGNOSIS — M545 Low back pain: Secondary | ICD-10-CM | POA: Diagnosis not present

## 2020-04-01 DIAGNOSIS — M542 Cervicalgia: Secondary | ICD-10-CM | POA: Diagnosis not present

## 2020-04-05 DIAGNOSIS — M542 Cervicalgia: Secondary | ICD-10-CM | POA: Diagnosis not present

## 2020-04-05 DIAGNOSIS — M48062 Spinal stenosis, lumbar region with neurogenic claudication: Secondary | ICD-10-CM | POA: Diagnosis not present

## 2020-04-05 DIAGNOSIS — M545 Low back pain: Secondary | ICD-10-CM | POA: Diagnosis not present

## 2020-04-07 DIAGNOSIS — M48062 Spinal stenosis, lumbar region with neurogenic claudication: Secondary | ICD-10-CM | POA: Diagnosis not present

## 2020-04-07 DIAGNOSIS — M542 Cervicalgia: Secondary | ICD-10-CM | POA: Diagnosis not present

## 2020-04-07 DIAGNOSIS — M545 Low back pain: Secondary | ICD-10-CM | POA: Diagnosis not present

## 2020-04-12 DIAGNOSIS — M542 Cervicalgia: Secondary | ICD-10-CM | POA: Diagnosis not present

## 2020-04-12 DIAGNOSIS — M48062 Spinal stenosis, lumbar region with neurogenic claudication: Secondary | ICD-10-CM | POA: Diagnosis not present

## 2020-04-12 DIAGNOSIS — M545 Low back pain: Secondary | ICD-10-CM | POA: Diagnosis not present

## 2020-04-14 DIAGNOSIS — M542 Cervicalgia: Secondary | ICD-10-CM | POA: Diagnosis not present

## 2020-04-14 DIAGNOSIS — M545 Low back pain: Secondary | ICD-10-CM | POA: Diagnosis not present

## 2020-04-14 DIAGNOSIS — M48062 Spinal stenosis, lumbar region with neurogenic claudication: Secondary | ICD-10-CM | POA: Diagnosis not present

## 2020-05-03 DIAGNOSIS — M542 Cervicalgia: Secondary | ICD-10-CM | POA: Diagnosis not present

## 2020-05-03 DIAGNOSIS — M545 Low back pain: Secondary | ICD-10-CM | POA: Diagnosis not present

## 2020-05-03 DIAGNOSIS — M48062 Spinal stenosis, lumbar region with neurogenic claudication: Secondary | ICD-10-CM | POA: Diagnosis not present

## 2020-05-05 DIAGNOSIS — M48062 Spinal stenosis, lumbar region with neurogenic claudication: Secondary | ICD-10-CM | POA: Diagnosis not present

## 2020-05-05 DIAGNOSIS — M545 Low back pain: Secondary | ICD-10-CM | POA: Diagnosis not present

## 2020-05-05 DIAGNOSIS — M542 Cervicalgia: Secondary | ICD-10-CM | POA: Diagnosis not present

## 2020-05-07 DIAGNOSIS — R519 Headache, unspecified: Secondary | ICD-10-CM | POA: Diagnosis not present

## 2020-05-07 DIAGNOSIS — M5481 Occipital neuralgia: Secondary | ICD-10-CM | POA: Diagnosis not present

## 2020-05-07 DIAGNOSIS — M542 Cervicalgia: Secondary | ICD-10-CM | POA: Diagnosis not present

## 2020-05-24 DIAGNOSIS — M48062 Spinal stenosis, lumbar region with neurogenic claudication: Secondary | ICD-10-CM | POA: Diagnosis not present

## 2020-05-24 DIAGNOSIS — M545 Low back pain: Secondary | ICD-10-CM | POA: Diagnosis not present

## 2020-05-24 DIAGNOSIS — M542 Cervicalgia: Secondary | ICD-10-CM | POA: Diagnosis not present

## 2020-05-26 DIAGNOSIS — M545 Low back pain: Secondary | ICD-10-CM | POA: Diagnosis not present

## 2020-05-26 DIAGNOSIS — M47812 Spondylosis without myelopathy or radiculopathy, cervical region: Secondary | ICD-10-CM | POA: Diagnosis not present

## 2020-05-26 DIAGNOSIS — M542 Cervicalgia: Secondary | ICD-10-CM | POA: Diagnosis not present

## 2020-05-26 DIAGNOSIS — M48062 Spinal stenosis, lumbar region with neurogenic claudication: Secondary | ICD-10-CM | POA: Diagnosis not present

## 2020-05-27 DIAGNOSIS — M5412 Radiculopathy, cervical region: Secondary | ICD-10-CM | POA: Diagnosis not present

## 2020-05-27 DIAGNOSIS — M542 Cervicalgia: Secondary | ICD-10-CM | POA: Diagnosis not present

## 2020-06-02 DIAGNOSIS — M48062 Spinal stenosis, lumbar region with neurogenic claudication: Secondary | ICD-10-CM | POA: Diagnosis not present

## 2020-06-02 DIAGNOSIS — M542 Cervicalgia: Secondary | ICD-10-CM | POA: Diagnosis not present

## 2020-06-02 DIAGNOSIS — M545 Low back pain: Secondary | ICD-10-CM | POA: Diagnosis not present

## 2020-06-07 DIAGNOSIS — M542 Cervicalgia: Secondary | ICD-10-CM | POA: Diagnosis not present

## 2020-06-07 DIAGNOSIS — M545 Low back pain: Secondary | ICD-10-CM | POA: Diagnosis not present

## 2020-06-07 DIAGNOSIS — M48062 Spinal stenosis, lumbar region with neurogenic claudication: Secondary | ICD-10-CM | POA: Diagnosis not present

## 2020-06-09 DIAGNOSIS — M545 Low back pain: Secondary | ICD-10-CM | POA: Diagnosis not present

## 2020-06-09 DIAGNOSIS — M542 Cervicalgia: Secondary | ICD-10-CM | POA: Diagnosis not present

## 2020-06-09 DIAGNOSIS — M48062 Spinal stenosis, lumbar region with neurogenic claudication: Secondary | ICD-10-CM | POA: Diagnosis not present

## 2020-06-14 DIAGNOSIS — M545 Low back pain: Secondary | ICD-10-CM | POA: Diagnosis not present

## 2020-06-14 DIAGNOSIS — M542 Cervicalgia: Secondary | ICD-10-CM | POA: Diagnosis not present

## 2020-06-14 DIAGNOSIS — M48062 Spinal stenosis, lumbar region with neurogenic claudication: Secondary | ICD-10-CM | POA: Diagnosis not present

## 2020-06-14 DIAGNOSIS — M65321 Trigger finger, right index finger: Secondary | ICD-10-CM | POA: Diagnosis not present

## 2020-06-16 DIAGNOSIS — M545 Low back pain: Secondary | ICD-10-CM | POA: Diagnosis not present

## 2020-06-16 DIAGNOSIS — M542 Cervicalgia: Secondary | ICD-10-CM | POA: Diagnosis not present

## 2020-06-16 DIAGNOSIS — M48062 Spinal stenosis, lumbar region with neurogenic claudication: Secondary | ICD-10-CM | POA: Diagnosis not present

## 2020-06-21 DIAGNOSIS — M542 Cervicalgia: Secondary | ICD-10-CM | POA: Diagnosis not present

## 2020-06-21 DIAGNOSIS — M48062 Spinal stenosis, lumbar region with neurogenic claudication: Secondary | ICD-10-CM | POA: Diagnosis not present

## 2020-06-21 DIAGNOSIS — M545 Low back pain: Secondary | ICD-10-CM | POA: Diagnosis not present

## 2020-06-23 DIAGNOSIS — M545 Low back pain: Secondary | ICD-10-CM | POA: Diagnosis not present

## 2020-06-23 DIAGNOSIS — M542 Cervicalgia: Secondary | ICD-10-CM | POA: Diagnosis not present

## 2020-06-23 DIAGNOSIS — M48062 Spinal stenosis, lumbar region with neurogenic claudication: Secondary | ICD-10-CM | POA: Diagnosis not present

## 2020-06-30 DIAGNOSIS — M542 Cervicalgia: Secondary | ICD-10-CM | POA: Diagnosis not present

## 2020-06-30 DIAGNOSIS — M48062 Spinal stenosis, lumbar region with neurogenic claudication: Secondary | ICD-10-CM | POA: Diagnosis not present

## 2020-06-30 DIAGNOSIS — M545 Low back pain, unspecified: Secondary | ICD-10-CM | POA: Diagnosis not present

## 2020-07-01 ENCOUNTER — Other Ambulatory Visit: Payer: Self-pay | Admitting: Neurosurgery

## 2020-07-01 DIAGNOSIS — M542 Cervicalgia: Secondary | ICD-10-CM

## 2020-07-07 DIAGNOSIS — M542 Cervicalgia: Secondary | ICD-10-CM | POA: Diagnosis not present

## 2020-07-07 DIAGNOSIS — M48062 Spinal stenosis, lumbar region with neurogenic claudication: Secondary | ICD-10-CM | POA: Diagnosis not present

## 2020-07-12 ENCOUNTER — Encounter
Admission: RE | Admit: 2020-07-12 | Discharge: 2020-07-12 | Disposition: A | Discharge status: Home / Self Care | Payer: PPO | Source: Ambulatory Visit | Attending: Neurosurgery | Admitting: Neurosurgery

## 2020-07-12 ENCOUNTER — Ambulatory Visit
Admission: RE | Admit: 2020-07-12 | Discharge: 2020-07-12 | Disposition: A | Discharge status: Home / Self Care | Payer: PPO | Source: Ambulatory Visit | Attending: Neurosurgery | Admitting: Neurosurgery

## 2020-07-12 ENCOUNTER — Other Ambulatory Visit: Payer: Self-pay

## 2020-07-12 DIAGNOSIS — M47812 Spondylosis without myelopathy or radiculopathy, cervical region: Secondary | ICD-10-CM | POA: Diagnosis not present

## 2020-07-12 DIAGNOSIS — M542 Cervicalgia: Secondary | ICD-10-CM | POA: Diagnosis not present

## 2020-07-12 MED ORDER — TECHNETIUM TC 99M MEDRONATE IV KIT: 20.4840 | PACK | Freq: Once | INTRAVENOUS | Status: DC | PRN

## 2020-07-14 DIAGNOSIS — M48062 Spinal stenosis, lumbar region with neurogenic claudication: Secondary | ICD-10-CM | POA: Diagnosis not present

## 2020-07-14 DIAGNOSIS — M542 Cervicalgia: Secondary | ICD-10-CM | POA: Diagnosis not present

## 2020-07-19 DIAGNOSIS — M48062 Spinal stenosis, lumbar region with neurogenic claudication: Secondary | ICD-10-CM | POA: Diagnosis not present

## 2020-07-19 DIAGNOSIS — M542 Cervicalgia: Secondary | ICD-10-CM | POA: Diagnosis not present

## 2020-07-21 DIAGNOSIS — M542 Cervicalgia: Secondary | ICD-10-CM | POA: Diagnosis not present

## 2020-07-21 DIAGNOSIS — M48062 Spinal stenosis, lumbar region with neurogenic claudication: Secondary | ICD-10-CM | POA: Diagnosis not present

## 2020-07-26 DIAGNOSIS — M48062 Spinal stenosis, lumbar region with neurogenic claudication: Secondary | ICD-10-CM | POA: Diagnosis not present

## 2020-07-26 DIAGNOSIS — M542 Cervicalgia: Secondary | ICD-10-CM | POA: Diagnosis not present

## 2020-07-28 DIAGNOSIS — M542 Cervicalgia: Secondary | ICD-10-CM | POA: Diagnosis not present

## 2020-07-28 DIAGNOSIS — M48062 Spinal stenosis, lumbar region with neurogenic claudication: Secondary | ICD-10-CM | POA: Diagnosis not present

## 2020-08-04 DIAGNOSIS — M542 Cervicalgia: Secondary | ICD-10-CM | POA: Diagnosis not present

## 2020-08-04 DIAGNOSIS — M48062 Spinal stenosis, lumbar region with neurogenic claudication: Secondary | ICD-10-CM | POA: Diagnosis not present

## 2020-08-09 DIAGNOSIS — M542 Cervicalgia: Secondary | ICD-10-CM | POA: Diagnosis not present

## 2020-08-09 DIAGNOSIS — M48062 Spinal stenosis, lumbar region with neurogenic claudication: Secondary | ICD-10-CM | POA: Diagnosis not present

## 2020-08-10 DIAGNOSIS — Z01818 Encounter for other preprocedural examination: Secondary | ICD-10-CM | POA: Diagnosis not present

## 2020-08-11 DIAGNOSIS — M48062 Spinal stenosis, lumbar region with neurogenic claudication: Secondary | ICD-10-CM | POA: Diagnosis not present

## 2020-08-11 DIAGNOSIS — M542 Cervicalgia: Secondary | ICD-10-CM | POA: Diagnosis not present

## 2020-08-18 DIAGNOSIS — G4733 Obstructive sleep apnea (adult) (pediatric): Secondary | ICD-10-CM | POA: Diagnosis not present

## 2020-08-23 DIAGNOSIS — M48062 Spinal stenosis, lumbar region with neurogenic claudication: Secondary | ICD-10-CM | POA: Diagnosis not present

## 2020-08-23 DIAGNOSIS — M542 Cervicalgia: Secondary | ICD-10-CM | POA: Diagnosis not present

## 2020-08-25 DIAGNOSIS — I10 Essential (primary) hypertension: Secondary | ICD-10-CM | POA: Diagnosis not present

## 2020-08-25 DIAGNOSIS — M5136 Other intervertebral disc degeneration, lumbar region: Secondary | ICD-10-CM | POA: Diagnosis not present

## 2020-08-25 DIAGNOSIS — M47812 Spondylosis without myelopathy or radiculopathy, cervical region: Secondary | ICD-10-CM | POA: Diagnosis not present

## 2020-08-25 DIAGNOSIS — N1831 Chronic kidney disease, stage 3a: Secondary | ICD-10-CM | POA: Diagnosis not present

## 2020-08-25 DIAGNOSIS — G2581 Restless legs syndrome: Secondary | ICD-10-CM | POA: Diagnosis not present

## 2020-08-25 DIAGNOSIS — G4733 Obstructive sleep apnea (adult) (pediatric): Secondary | ICD-10-CM | POA: Diagnosis not present

## 2020-08-25 DIAGNOSIS — Z01818 Encounter for other preprocedural examination: Secondary | ICD-10-CM | POA: Diagnosis not present

## 2020-08-25 DIAGNOSIS — M542 Cervicalgia: Secondary | ICD-10-CM | POA: Diagnosis not present

## 2020-08-25 DIAGNOSIS — Z0000 Encounter for general adult medical examination without abnormal findings: Secondary | ICD-10-CM | POA: Diagnosis not present

## 2020-08-25 DIAGNOSIS — E1122 Type 2 diabetes mellitus with diabetic chronic kidney disease: Secondary | ICD-10-CM | POA: Diagnosis not present

## 2020-08-25 DIAGNOSIS — M5412 Radiculopathy, cervical region: Secondary | ICD-10-CM | POA: Diagnosis not present

## 2020-08-25 DIAGNOSIS — F330 Major depressive disorder, recurrent, mild: Secondary | ICD-10-CM | POA: Diagnosis not present

## 2020-08-25 DIAGNOSIS — K219 Gastro-esophageal reflux disease without esophagitis: Secondary | ICD-10-CM | POA: Diagnosis not present

## 2020-08-25 DIAGNOSIS — E782 Mixed hyperlipidemia: Secondary | ICD-10-CM | POA: Diagnosis not present

## 2020-08-26 DIAGNOSIS — Z01818 Encounter for other preprocedural examination: Secondary | ICD-10-CM | POA: Diagnosis not present

## 2020-08-27 DIAGNOSIS — Z7982 Long term (current) use of aspirin: Secondary | ICD-10-CM | POA: Diagnosis not present

## 2020-08-27 DIAGNOSIS — Z87442 Personal history of urinary calculi: Secondary | ICD-10-CM | POA: Diagnosis not present

## 2020-08-27 DIAGNOSIS — M5412 Radiculopathy, cervical region: Secondary | ICD-10-CM | POA: Diagnosis not present

## 2020-08-27 DIAGNOSIS — E7800 Pure hypercholesterolemia, unspecified: Secondary | ICD-10-CM | POA: Diagnosis not present

## 2020-08-27 DIAGNOSIS — E785 Hyperlipidemia, unspecified: Secondary | ICD-10-CM | POA: Diagnosis not present

## 2020-08-27 DIAGNOSIS — K219 Gastro-esophageal reflux disease without esophagitis: Secondary | ICD-10-CM | POA: Diagnosis not present

## 2020-08-27 DIAGNOSIS — M5136 Other intervertebral disc degeneration, lumbar region: Secondary | ICD-10-CM | POA: Diagnosis not present

## 2020-08-27 DIAGNOSIS — Z91041 Radiographic dye allergy status: Secondary | ICD-10-CM | POA: Diagnosis not present

## 2020-08-27 DIAGNOSIS — Z87891 Personal history of nicotine dependence: Secondary | ICD-10-CM | POA: Diagnosis not present

## 2020-08-27 DIAGNOSIS — G2581 Restless legs syndrome: Secondary | ICD-10-CM | POA: Diagnosis not present

## 2020-08-27 DIAGNOSIS — K5900 Constipation, unspecified: Secondary | ICD-10-CM | POA: Diagnosis not present

## 2020-08-27 DIAGNOSIS — M542 Cervicalgia: Secondary | ICD-10-CM | POA: Diagnosis not present

## 2020-08-27 DIAGNOSIS — N183 Chronic kidney disease, stage 3 unspecified: Secondary | ICD-10-CM | POA: Diagnosis not present

## 2020-08-27 DIAGNOSIS — E1122 Type 2 diabetes mellitus with diabetic chronic kidney disease: Secondary | ICD-10-CM | POA: Diagnosis not present

## 2020-08-27 DIAGNOSIS — G4730 Sleep apnea, unspecified: Secondary | ICD-10-CM | POA: Diagnosis not present

## 2020-08-27 DIAGNOSIS — Z882 Allergy status to sulfonamides status: Secondary | ICD-10-CM | POA: Diagnosis not present

## 2020-08-27 DIAGNOSIS — F418 Other specified anxiety disorders: Secondary | ICD-10-CM | POA: Diagnosis not present

## 2020-08-27 DIAGNOSIS — Z888 Allergy status to other drugs, medicaments and biological substances status: Secondary | ICD-10-CM | POA: Diagnosis not present

## 2020-08-27 DIAGNOSIS — Z886 Allergy status to analgesic agent status: Secondary | ICD-10-CM | POA: Diagnosis not present

## 2020-08-27 DIAGNOSIS — Z8701 Personal history of pneumonia (recurrent): Secondary | ICD-10-CM | POA: Diagnosis not present

## 2020-08-27 DIAGNOSIS — M4722 Other spondylosis with radiculopathy, cervical region: Secondary | ICD-10-CM | POA: Diagnosis not present

## 2020-08-27 DIAGNOSIS — I129 Hypertensive chronic kidney disease with stage 1 through stage 4 chronic kidney disease, or unspecified chronic kidney disease: Secondary | ICD-10-CM | POA: Diagnosis not present

## 2020-08-27 DIAGNOSIS — Z885 Allergy status to narcotic agent status: Secondary | ICD-10-CM | POA: Diagnosis not present

## 2020-09-03 DIAGNOSIS — E1122 Type 2 diabetes mellitus with diabetic chronic kidney disease: Secondary | ICD-10-CM | POA: Diagnosis not present

## 2020-09-03 DIAGNOSIS — Z87440 Personal history of urinary (tract) infections: Secondary | ICD-10-CM | POA: Diagnosis not present

## 2020-09-03 DIAGNOSIS — I129 Hypertensive chronic kidney disease with stage 1 through stage 4 chronic kidney disease, or unspecified chronic kidney disease: Secondary | ICD-10-CM | POA: Diagnosis not present

## 2020-09-03 DIAGNOSIS — Z7982 Long term (current) use of aspirin: Secondary | ICD-10-CM | POA: Diagnosis not present

## 2020-09-03 DIAGNOSIS — I471 Supraventricular tachycardia: Secondary | ICD-10-CM | POA: Diagnosis not present

## 2020-09-03 DIAGNOSIS — F418 Other specified anxiety disorders: Secondary | ICD-10-CM | POA: Diagnosis not present

## 2020-09-03 DIAGNOSIS — G43909 Migraine, unspecified, not intractable, without status migrainosus: Secondary | ICD-10-CM | POA: Diagnosis not present

## 2020-09-03 DIAGNOSIS — N200 Calculus of kidney: Secondary | ICD-10-CM | POA: Diagnosis not present

## 2020-09-03 DIAGNOSIS — M5136 Other intervertebral disc degeneration, lumbar region: Secondary | ICD-10-CM | POA: Diagnosis not present

## 2020-09-03 DIAGNOSIS — M5124 Other intervertebral disc displacement, thoracic region: Secondary | ICD-10-CM | POA: Diagnosis not present

## 2020-09-03 DIAGNOSIS — M47814 Spondylosis without myelopathy or radiculopathy, thoracic region: Secondary | ICD-10-CM | POA: Diagnosis not present

## 2020-09-03 DIAGNOSIS — M4722 Other spondylosis with radiculopathy, cervical region: Secondary | ICD-10-CM | POA: Diagnosis not present

## 2020-09-03 DIAGNOSIS — E7800 Pure hypercholesterolemia, unspecified: Secondary | ICD-10-CM | POA: Diagnosis not present

## 2020-09-03 DIAGNOSIS — M9982 Other biomechanical lesions of thoracic region: Secondary | ICD-10-CM | POA: Diagnosis not present

## 2020-09-03 DIAGNOSIS — M9981 Other biomechanical lesions of cervical region: Secondary | ICD-10-CM | POA: Diagnosis not present

## 2020-09-03 DIAGNOSIS — Z981 Arthrodesis status: Secondary | ICD-10-CM | POA: Diagnosis not present

## 2020-09-03 DIAGNOSIS — M50123 Cervical disc disorder at C6-C7 level with radiculopathy: Secondary | ICD-10-CM | POA: Diagnosis not present

## 2020-09-03 DIAGNOSIS — Z4789 Encounter for other orthopedic aftercare: Secondary | ICD-10-CM | POA: Diagnosis not present

## 2020-09-03 DIAGNOSIS — E1140 Type 2 diabetes mellitus with diabetic neuropathy, unspecified: Secondary | ICD-10-CM | POA: Diagnosis not present

## 2020-09-03 DIAGNOSIS — G2581 Restless legs syndrome: Secondary | ICD-10-CM | POA: Diagnosis not present

## 2020-09-03 DIAGNOSIS — M50122 Cervical disc disorder at C5-C6 level with radiculopathy: Secondary | ICD-10-CM | POA: Diagnosis not present

## 2020-09-03 DIAGNOSIS — M1612 Unilateral primary osteoarthritis, left hip: Secondary | ICD-10-CM | POA: Diagnosis not present

## 2020-09-03 DIAGNOSIS — G4730 Sleep apnea, unspecified: Secondary | ICD-10-CM | POA: Diagnosis not present

## 2020-09-03 DIAGNOSIS — K219 Gastro-esophageal reflux disease without esophagitis: Secondary | ICD-10-CM | POA: Diagnosis not present

## 2020-09-03 DIAGNOSIS — N183 Chronic kidney disease, stage 3 unspecified: Secondary | ICD-10-CM | POA: Diagnosis not present

## 2020-09-16 DIAGNOSIS — E1122 Type 2 diabetes mellitus with diabetic chronic kidney disease: Secondary | ICD-10-CM | POA: Diagnosis not present

## 2020-09-16 DIAGNOSIS — E782 Mixed hyperlipidemia: Secondary | ICD-10-CM | POA: Diagnosis not present

## 2020-09-16 DIAGNOSIS — N1831 Chronic kidney disease, stage 3a: Secondary | ICD-10-CM | POA: Diagnosis not present

## 2020-09-17 ENCOUNTER — Other Ambulatory Visit: Payer: Self-pay

## 2020-09-17 ENCOUNTER — Emergency Department
Admission: EM | Admit: 2020-09-17 | Discharge: 2020-09-17 | Disposition: A | Discharge status: Home / Self Care | Payer: PPO | Attending: Emergency Medicine | Admitting: Emergency Medicine

## 2020-09-17 DIAGNOSIS — Z87891 Personal history of nicotine dependence: Secondary | ICD-10-CM | POA: Insufficient documentation

## 2020-09-17 DIAGNOSIS — Z79899 Other long term (current) drug therapy: Secondary | ICD-10-CM | POA: Diagnosis not present

## 2020-09-17 DIAGNOSIS — I10 Essential (primary) hypertension: Secondary | ICD-10-CM | POA: Diagnosis not present

## 2020-09-17 DIAGNOSIS — R21 Rash and other nonspecific skin eruption: Secondary | ICD-10-CM | POA: Diagnosis present

## 2020-09-17 DIAGNOSIS — E119 Type 2 diabetes mellitus without complications: Secondary | ICD-10-CM | POA: Insufficient documentation

## 2020-09-17 DIAGNOSIS — L259 Unspecified contact dermatitis, unspecified cause: Secondary | ICD-10-CM | POA: Insufficient documentation

## 2020-09-17 DIAGNOSIS — Z7982 Long term (current) use of aspirin: Secondary | ICD-10-CM | POA: Diagnosis not present

## 2020-09-17 MED ORDER — FAMCICLOVIR 500 MG PO TABS: 500.0000 mg | ORAL_TABLET | Freq: Three times a day (TID) | ORAL | 0 refills | Status: DC

## 2020-09-17 NOTE — ED Triage Notes (Addendum)
PT to ED c/o red spot to the L side of her neck, pt concerned for shingles. PT has c-collar that she intermittently wears that was bothering this spot. Painful to touch. None of this rash noted to abd.

## 2020-09-17 NOTE — ED Provider Notes (Signed)
[facility] Emergency Department Provider Note  ____________________________________________   Event Date/Time   First MD Initiated Contact with Patient 09/17/20 2026     (approximate)  I have reviewed the triage vital signs and the nursing notes.   HISTORY  Chief Complaint Rash    HPI TAMAKA SAWIN is a 64 y.o. female presents emergency department with concerns of shingles on the left side of her neck.  Patient states she recently had surgery on her cervical spine.  States that she has been wearing a neck brace on and off and it does rub in this area.  She has no pain in the area.  No tingling.  No itchiness.  It only hurts when someone touches it.  She denies any fever or chills.    Past Medical History:  Diagnosis Date   Anxiety    Arrhythmia    Arthritis    Bulging lumbar disc    Controlled diabetes mellitus ([place])    Diet controlled   Cystocele    DDD (degenerative disc disease), cervical    Depression    External hemorrhoids    Facial basal cell cancer    Fibromyalgia    Diagnosed in 2010   Heart attack ([place])    Resulted on EKG -2015   High cholesterol    History of kidney stones    HTN (hypertension)    Rectocele    Recurrent UTI    Sleep apnea    CPAP   Spleen anomaly    compressed   SVT (supraventricular tachycardia) ([place])     Patient Active Problem List   Diagnosis Date Noted   Lumbar radiculopathy 12/18/2019   Lumbosacral radiculitis 12/18/2019   Osteoarthritis of knee 12/18/2019   Primary localized osteoarthritis of pelvic region and thigh 12/18/2019   Bursitis of hip 12/18/2019   Post-operative pain    Chronic bilateral low back pain with left-sided sciatica    SVT (supraventricular tachycardia) ([place])    Diabetes mellitus type 2 in obese (HCC)    Generalized anxiety disorder    Leukocytosis    Surgery, elective    Spondylolisthesis of lumbar region 06/27/2018   Kidney stone  03/07/2017   Palpitations 03/07/2017   SOB (shortness of breath) 03/07/2017   Obstructive sleep apnea (adult) (pediatric) 08/11/2016   Supraventricular tachycardia ([place]) 11/16/2015   Incomplete rectal prolapse 11/16/2015   Adrenal nodule ([place]) 09/12/2015   Bipolar I disorder ([place]) 08/26/2015   Essential (primary) hypertension 08/26/2015   Arthralgia of hip 08/26/2015   H/O disease 08/26/2015   Decreased potassium in the blood 08/26/2015   Idiopathic peripheral neuropathy 08/26/2015   Rectocele 08/26/2015   Midline cystocele 08/26/2015   Vaginal atrophy 08/26/2015   Cystocele 08/26/2015   Recurrent UTI 08/24/2015   Microscopic hematuria 08/24/2015   Atrophic vaginitis 08/24/2015   Migraine without status migrainosus, not intractable 06/25/2015   Other chronic diseases of tonsils and adenoids (CODE) 06/25/2015   Periodic limb movement disorder 06/25/2015   Symptomatic PVCs 03/19/2015   Degeneration of intervertebral disc of lumbar region 11/20/2014   Combined fat and carbohydrate induced hyperlipemia 11/20/2014   Anxiety 11/20/2014   Type 2 diabetes mellitus ([place]) 02/13/2013   Family history of thyroid disease 02/13/2013   Hypomagnesemia 02/13/2013   Degenerative arthritis of hip 02/06/2013    Past Surgical History:  Procedure Laterality Date   BASAL CELL CARCINOMA EXCISION     face   bladder tack  2004   CARDIAC CATHETERIZATION  CERVICAL FUSION  2020   COSMETIC SURGERY  2014   Lip   CYSTOSCOPY W/ RETROGRADES Bilateral 10/04/2015   Procedure: CYSTOSCOPY WITH RETROGRADE PYELOGRAM;  Surgeon: Hollice Espy, MD;  Location: [facility] ORS;  Service: Urology;  Laterality: Bilateral;   Heart ablation     KIDNEY STONE SURGERY     stents   VAGINAL HYSTERECTOMY      Prior to Admission medications   Medication Sig Start Date End Date Taking? Authorizing Provider  aspirin EC 81 MG tablet Take 81 mg by mouth at bedtime.     [provider]  atenolol (TENORMIN) 25 MG tablet Take 25 mg by mouth daily. My take a second 25 mg dose as needed for PVC 09/20/14   [provider]  buPROPion (WELLBUTRIN XL) 300 MG 24 hr tablet Take 300 mg by mouth daily. 11/24/19   [provider]  Calcium Polycarbophil (FIBERCON PO) Take 1 Dose by mouth at bedtime.    [provider]  clonazePAM (KLONOPIN) 0.5 MG tablet Take 0.5 mg by mouth 2 (two) times daily as needed for anxiety. 04/30/18   [provider]  cyclobenzaprine (FLEXERIL) 5 MG tablet Take 5-10 mg by mouth 2 (two) times daily as needed for muscle spasms. 06/03/18   [provider]  docusate sodium (COLACE) 250 MG capsule Take 250 mg by mouth daily.    [provider]  estradiol (ESTRACE) 0.1 MG/GM vaginal cream Place 1 Applicatorful vaginally every Thursday. 11/20/19   Harlin Heys, MD  famciclovir (FAMVIR) 500 MG tablet Take 1 tablet (500 mg total) by mouth 3 (three) times daily. 09/17/20   Lu Paradise, Linden Dolin, PA-C  fenofibrate (TRICOR) 145 MG tablet Take 145 mg by mouth at bedtime.  08/06/14   [provider]  glucose blood test strip  05/09/13   [provider]  KLOR-CON M20 20 MEQ tablet Take 20 mEq by mouth daily. May take a second 10 meq dose at night as needed for leg cramps 08/06/14   [provider]  losartan (COZAAR) 50 MG tablet Take 50 mg by mouth daily. 09/01/14   [provider]  Magnesium 500 MG CAPS Take 500 mg by mouth daily.     [provider]  Melatonin 5 MG CAPS Take 5 mg by mouth at bedtime as needed (sleep).     [provider]  niacin 500 MG tablet Take 500 mg by mouth at bedtime.    [provider]  nortriptyline (PAMELOR) 75 MG capsule Take 75 mg by mouth at bedtime.  09/15/14   [provider]  oxyCODONE (OXY IR/ROXICODONE) 5 MG immediate release tablet Take 1 tablet (5 mg total) by mouth every 4 (four) hours as needed for moderate pain ((score 4 to  6)). Patient not taking: Reported on 12/17/2019 07/02/18   Newman Pies, MD  polyethylene glycol [facility] / Floria Raveling) packet Take 17 g by mouth daily.    [provider]  rOPINIRole (REQUIP) 0.5 MG tablet Take 0.5 mg by mouth at bedtime as needed (restless legs).  03/25/18   [provider]  tapentadol (NUCYNTA) 50 MG tablet Take 75 mg by mouth.    [provider]  temazepam (RESTORIL) 15 MG capsule Take 15 mg by mouth daily as needed for sleep (restless legs).  05/02/18   [provider]  triamterene-hydrochlorothiazide (MAXZIDE-25) 37.5-25 MG per tablet Take 1 tablet by mouth every morning. 08/06/14   [provider]    Allergies Ivp dye [  iodinated diagnostic agents], Sulfa antibiotics, Demerol [meperidine], Influenza virus vaccine, Nsaids, Prednisone, and Ketorolac tromethamine  Family History  Problem Relation Age of Onset   Kidney Stones Father    Hematuria Father    Heart failure Father    Heart failure Mother        Father   Melanoma Mother    Diabetes Mother    Diabetes Sister    Colon cancer Maternal Aunt    Breast cancer Maternal Aunt 2       one aunt and one great aunt   Diabetes Maternal Grandmother    Ovarian cancer Neg Hx     Social History Social History   Tobacco Use   Smoking status: Former Smoker    Packs/day: 0.25    Types: Cigarettes    Quit date: 2000    Years since quitting: 21.9   Smokeless tobacco: Never Used   Tobacco comment: quit 20 years  Vaping Use   Vaping Use: Never used  Substance Use Topics   Alcohol use: No    Alcohol/week: 0.0 standard drinks   Drug use: No    Review of Systems  Constitutional: No fever/chills Eyes: No visual changes. ENT: No sore throat. Respiratory: Denies cough Cardiovascular: Denies chest pain Gastrointestinal: Denies abdominal pain Genitourinary: Negative for dysuria. Musculoskeletal: Negative for back pain. Skin: Positive for  rash. Psychiatric: no mood changes,     ____________________________________________   PHYSICAL EXAM:  VITAL SIGNS: ED Triage Vitals  Enc Vitals Group     BP 09/17/20 1831 (!) 160/84     Pulse Rate 09/17/20 1831 95     Resp 09/17/20 1831 18     Temp 09/17/20 1831 99 F (37.2 C)     Temp Source 09/17/20 1831 Oral     SpO2 09/17/20 1831 100 %     Weight 09/17/20 1831 200 lb (90.7 kg)     Height 09/17/20 1831 5\' 8"  (1.727 m)     Head Circumference --      Peak Flow --      Pain Score 09/17/20 1837 3     Pain Loc --      Pain Edu? --      Excl. in [place]? --     Constitutional: Alert and oriented. Well appearing and in no acute distress. Eyes: Conjunctivae are normal.  Head: Atraumatic. Nose: No congestion/rhinnorhea. Mouth/Throat: Mucous membranes are moist.   Neck:  supple no lymphadenopathy noted Cardiovascular: Normal rate, regular rhythm.  Respiratory: Normal respiratory effort.  No retractions, GU: deferred Musculoskeletal: FROM all extremities, warm and well perfused Neurologic:  Normal speech and language.  Skin:  Skin is warm, dry and intact.  Recent surgical scar noted at the posterior neck with some crusting, area below the left ear has a raised pink area but no vesicles are noted, area feels rough  psychiatric: Mood and affect are normal. Speech and behavior are normal.  ____________________________________________   LABS (all labs ordered are listed, but only abnormal results are displayed)  Labs Reviewed - No data to display ____________________________________________   ____________________________________________  RADIOLOGY    ____________________________________________   PROCEDURES  Procedure(s) performed: No  Procedures    ____________________________________________   INITIAL IMPRESSION / ASSESSMENT AND PLAN / ED COURSE  Pertinent labs & imaging results that were available during my care of the patient were reviewed by me and  considered in my medical decision making (see chart for details).   Patient is a 64 year old female presents emergency department  with concerns for shingles.  See HPI.  Physical exam shows an area on the left side of her neck that appears to be more of a contact dermatitis at this time.  Due to her not having any pain in this area I feel that it is more of a contact dermatitis.  She is to apply hydrocortisone cream to this area.  I did also give her a prescription for Famvir in case she developed some vesicles and pain in the area and additional lesions.  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  She states she will only use it if needed.  She was discharged in stable condition.     Erin Campos was evaluated in Emergency Department on 09/17/2020 for the symptoms described in the history of present illness. She was evaluated in the context of the global COVID-19 pandemic, which necessitated consideration that the patient might be at risk for infection with the SARS-CoV-2 virus that causes COVID-19. Institutional protocols and algorithms that pertain to the evaluation of patients at risk for COVID-19 are in a state of rapid change based on information released by regulatory bodies including the CDC and federal and state organizations. These policies and algorithms were followed during the patient's care in the ED.    As part of my medical decision making, I reviewed the following data within the [place] notes reviewed and incorporated, Old chart reviewed, Notes from prior ED visits and [place] Controlled Substance Database  ____________________________________________   FINAL CLINICAL IMPRESSION(S) / ED DIAGNOSES  Final diagnoses:  Acute contact dermatitis      NEW MEDICATIONS STARTED DURING THIS VISIT:  New Prescriptions   FAMCICLOVIR (FAMVIR) 500 MG TABLET    Take 1 tablet (500 mg total) by mouth 3 (three) times daily.      Note:  This document was prepared using Dragon voice recognition software and may include unintentional dictation errors.    Versie Starks, PA-C 09/17/20 2238    Blake Divine, MD 09/18/20 [phone]

## 2020-09-17 NOTE — Discharge Instructions (Addendum)
Follow-up with your regular doctor if not improving in 3 to 4 days.  If you develop more places that are similar to this along your neck start taking the antiviral.  Apply hydrocortisone to the affected area.  Return if worsening

## 2020-09-22 ENCOUNTER — Ambulatory Visit
Admission: EM | Admit: 2020-09-22 | Discharge: 2020-09-22 | Disposition: A | Discharge status: Home / Self Care | Payer: PPO

## 2020-09-22 DIAGNOSIS — B028 Zoster with other complications: Secondary | ICD-10-CM | POA: Diagnosis not present

## 2020-09-22 NOTE — ED Provider Notes (Signed)
Erin Campos    CSN: [id number] Arrival date & time: 09/22/20  [id number]      History   Chief Complaint Chief Complaint  Patient presents with  . Rash    HPI Erin Campos is a 64 y.o. female.   Patient is a 63 year old female who presents today for rash.  The rash is located to the left ear, neck with radiation to left chest and shoulder area.  She was seen at [facility] on 12/24 and diagnosed with shingles.  Started on antiviral medication.  Feels like the rash has somewhat improved but appears to be still spreading.  Describes a sharp pain in the ear that comes and goes.  Has been taking her prescribed pain medication and Tylenol as needed for pain.      Past Medical History:  Diagnosis Date  . Anxiety   . Arrhythmia   . Arthritis   . Bulging lumbar disc   . Controlled diabetes mellitus ([place])    Diet controlled  . Cystocele   . DDD (degenerative disc disease), cervical   . Depression   . External hemorrhoids   . Facial basal cell cancer   . Fibromyalgia    Diagnosed in 2010  . Heart attack ([place])    Resulted on EKG -2015  . High cholesterol   . History of kidney stones   . HTN (hypertension)   . Rectocele   . Recurrent UTI   . Sleep apnea    CPAP  . Spleen anomaly    compressed  . SVT (supraventricular tachycardia) [facility])     Patient Active Problem List   Diagnosis Date Noted  . Lumbar radiculopathy 12/18/2019  . Lumbosacral radiculitis 12/18/2019  . Osteoarthritis of knee 12/18/2019  . Primary localized osteoarthritis of pelvic region and thigh 12/18/2019  . Bursitis of hip 12/18/2019  . Post-operative pain   . Chronic bilateral low back pain with left-sided sciatica   . SVT (supraventricular tachycardia) ([place])   . Diabetes mellitus type 2 in obese ([place])   . Generalized anxiety disorder   . Leukocytosis   . Surgery, elective   . Spondylolisthesis of lumbar region 06/27/2018  . Kidney stone 03/07/2017  . Palpitations 03/07/2017  . SOB  (shortness of breath) 03/07/2017  . Obstructive sleep apnea (adult) (pediatric) 08/11/2016  . Supraventricular tachycardia ([place]) 11/16/2015  . Incomplete rectal prolapse 11/16/2015  . Adrenal nodule ([place]) 09/12/2015  . Bipolar I disorder ([place]) 08/26/2015  . Essential (primary) hypertension 08/26/2015  . Arthralgia of hip 08/26/2015  . H/O disease 08/26/2015  . Decreased potassium in the blood 08/26/2015  . Idiopathic peripheral neuropathy 08/26/2015  . Rectocele 08/26/2015  . Midline cystocele 08/26/2015  . Vaginal atrophy 08/26/2015  . Cystocele 08/26/2015  . Recurrent UTI 08/24/2015  . Microscopic hematuria 08/24/2015  . Atrophic vaginitis 08/24/2015  . Migraine without status migrainosus, not intractable 06/25/2015  . Other chronic diseases of tonsils and adenoids (CODE) 06/25/2015  . Periodic limb movement disorder 06/25/2015  . Symptomatic PVCs 03/19/2015  . Degeneration of intervertebral disc of lumbar region 11/20/2014  . Combined fat and carbohydrate induced hyperlipemia 11/20/2014  . Anxiety 11/20/2014  . Type 2 diabetes mellitus ([place]) 02/13/2013  . Family history of thyroid disease 02/13/2013  . Hypomagnesemia 02/13/2013  . Degenerative arthritis of hip 02/06/2013    Past Surgical History:  Procedure Laterality Date  . BASAL CELL CARCINOMA EXCISION     face  . bladder tack  2004  . CARDIAC CATHETERIZATION    .  CERVICAL FUSION  2020  . COSMETIC SURGERY  2014   Lip  . CYSTOSCOPY W/ RETROGRADES Bilateral 10/04/2015   Procedure: CYSTOSCOPY WITH RETROGRADE PYELOGRAM;  Surgeon: Vanna Scotland, MD;  Location: [facility] ORS;  Service: Urology;  Laterality: Bilateral;  . Heart ablation    . KIDNEY STONE SURGERY     stents  . VAGINAL HYSTERECTOMY      OB History    Gravida  2   Para  2   Term  2   Preterm      AB      Living  2     SAB      IAB      Ectopic      Multiple      Live Births  2            Home Medications    Prior to Admission  medications   Medication Sig Start Date End Date Taking? Authorizing Provider  gabapentin (NEURONTIN) 100 MG capsule Take by mouth. 09/21/20 10/21/20 Yes [provider]  aspirin EC 81 MG tablet Take 81 mg by mouth at bedtime.     [provider]  atenolol (TENORMIN) 25 MG tablet Take 25 mg by mouth daily. My take a second 25 mg dose as needed for PVC 09/20/14   [provider]  buPROPion (WELLBUTRIN XL) 300 MG 24 hr tablet Take 300 mg by mouth daily. 11/24/19   [provider]  Calcium Polycarbophil (FIBERCON PO) Take 1 Dose by mouth at bedtime.    [provider]  clonazePAM (KLONOPIN) 0.5 MG tablet Take 0.5 mg by mouth 2 (two) times daily as needed for anxiety. 04/30/18   [provider]  cyclobenzaprine (FLEXERIL) 5 MG tablet Take 5-10 mg by mouth 2 (two) times daily as needed for muscle spasms. 06/03/18   [provider]  docusate sodium (COLACE) 250 MG capsule Take 250 mg by mouth daily.    [provider]  estradiol (ESTRACE) 0.1 MG/GM vaginal cream Place 1 Applicatorful vaginally every Thursday. 11/20/19   [name], MD  famciclovir (FAMVIR) 500 MG tablet Take 1 tablet (500 mg total) by mouth 3 (three) times daily. 09/17/20   Fisher, Roselyn Bering, PA-C  fenofibrate (TRICOR) 145 MG tablet Take 145 mg by mouth at bedtime.  08/06/14   [provider]  glucose blood test strip  05/09/13   [provider]  KLOR-CON M20 20 MEQ tablet Take 20 mEq by mouth daily. May take a second 10 meq dose at night as needed for leg cramps 08/06/14   [provider]  losartan (COZAAR) 50 MG tablet Take 50 mg by mouth daily. 09/01/14   [provider]  Magnesium 500 MG CAPS Take 500 mg by mouth daily.     [provider]  Melatonin 5 MG CAPS Take 5 mg by mouth at bedtime as needed (sleep).     [provider]  niacin 500 MG tablet Take 500 mg by mouth at bedtime.    [provider]   nortriptyline (PAMELOR) 75 MG capsule Take 75 mg by mouth at bedtime.  09/15/14   [provider]  oxyCODONE (OXY IR/ROXICODONE) 5 MG immediate release tablet Take 1 tablet (5 mg total) by mouth every 4 (four) hours as needed for moderate pain ((score 4 to 6)). Patient not taking: Reported on 12/17/2019 07/02/18   Tressie Stalker, MD  polyethylene glycol [facility] / Ethelene Hal) packet Take 17 g by mouth daily.  [provider]  rOPINIRole (REQUIP) 0.5 MG tablet Take 0.5 mg by mouth at bedtime as needed (restless legs).  03/25/18   [provider]  tapentadol (NUCYNTA) 50 MG tablet Take 75 mg by mouth.    [provider]  temazepam (RESTORIL) 15 MG capsule Take 15 mg by mouth daily as needed for sleep (restless legs).  05/02/18   [provider]  triamterene-hydrochlorothiazide (MAXZIDE-25) 37.5-25 MG per tablet Take 1 tablet by mouth every morning. 08/06/14   [provider]    Family History Family History  Problem Relation Age of Onset  . Kidney Stones Father   . Hematuria Father   . Heart failure Father   . Heart failure Mother        Father  . Melanoma Mother   . Diabetes Mother   . Diabetes Sister   . Colon cancer Maternal Aunt   . Breast cancer Maternal Aunt 47       one aunt and one great aunt  . Diabetes Maternal Grandmother   . Ovarian cancer Neg Hx     Social History Social History   Tobacco Use  . Smoking status: Former Smoker    Packs/day: 0.25    Types: Cigarettes    Quit date: 2000    Years since quitting: 22.0  . Smokeless tobacco: Never Used  . Tobacco comment: quit 20 years  Vaping Use  . Vaping Use: Never used  Substance Use Topics  . Alcohol use: No    Alcohol/week: 0.0 standard drinks  . Drug use: No     Allergies   Ivp dye [iodinated diagnostic agents], Sulfa antibiotics, Demerol [meperidine], Influenza virus vaccine, Nsaids, Prednisone, and Ketorolac tromethamine   Review of Systems Review of  Systems   Physical Exam Triage Vital Signs ED Triage Vitals  Enc Vitals Group     BP 09/22/20 1108 139/88     Pulse Rate 09/22/20 1108 97     Resp 09/22/20 1108 16     Temp 09/22/20 1108 98.8 F (37.1 C)     Temp Source 09/22/20 1108 Oral     SpO2 09/22/20 1108 98 %     Weight 09/22/20 1109 199 lb 15.3 oz (90.7 kg)     Height 09/22/20 1109 5\' 8"  (1.727 m)     Head Circumference --      Peak Flow --      Pain Score 09/22/20 1110 3     Pain Loc --      Pain Edu? --      Excl. in [place]? --    No data found.  Updated Vital Signs BP 139/88   Pulse 97   Temp 98.8 F (37.1 C) (Oral)   Resp 16   Ht 5\' 8"  (1.727 m)   Wt 199 lb 15.3 oz (90.7 kg)   SpO2 98%   BMI 30.40 kg/m   Visual Acuity Right Eye Distance:   Left Eye Distance:   Bilateral Distance:    Right Eye Near:   Left Eye Near:    Bilateral Near:     Physical Exam Vitals and nursing note reviewed.  Constitutional:      General: She is not in acute distress.    Appearance: Normal appearance. She is not ill-appearing, toxic-appearing or diaphoretic.  HENT:     Head: Normocephalic.     Nose: Nose normal.  Eyes:     Conjunctiva/sclera: Conjunctivae normal.  Pulmonary:     Effort: Pulmonary effort is  normal.  Musculoskeletal:        General: Normal range of motion.     Cervical back: Normal range of motion.  Skin:    General: Skin is warm and dry.     Findings: Rash present.  Neurological:     Mental Status: She is alert.  Psychiatric:        Mood and Affect: Mood normal.      UC Treatments / Results  Labs (all labs ordered are listed, but only abnormal results are displayed) Labs Reviewed - No data to display  EKG   Radiology No results found.  Procedures Procedures (including critical care time)  Medications Ordered in UC Medications - No data to display  Initial Impression / Assessment and Plan / UC Course  I have reviewed the triage vital signs and the nursing notes.  Pertinent  labs & imaging results that were available during my care of the patient were reviewed by me and considered in my medical decision making (see chart for details).     Herpes zoster Rash consistent with this.  She is already taking famciclovir Recommend continue this.  She can take pain medication as needed. She can try doing capsaicin cream over-the-counter Follow up as needed for continued or worsening symptoms  Final Clinical Impressions(s) / UC Diagnoses   Final diagnoses:  Herpes zoster with complication     Discharge Instructions     Continue the antiviral medication. You can take Tylenol as needed every 8 hours. Pain medication as needed You can try getting the capsaicin cream over-the-counter to help. Wash hands after applying.  Follow up as needed for continued or worsening symptoms     ED Prescriptions    None     PDMP not reviewed this encounter.   Loura Halt A, NP 09/22/20 1301

## 2020-09-22 NOTE — ED Triage Notes (Signed)
Pt reports having red spots on L arm and L side of chest. sts she was seen at [facility] on 12/24 and dx with shingles. Shooting pain around L ear. Pt was prescribed famciclovir.

## 2020-09-22 NOTE — Discharge Instructions (Addendum)
Continue the antiviral medication. You can take Tylenol as needed every 8 hours. Pain medication as needed You can try getting the capsaicin cream over-the-counter to help. Wash hands after applying.  Follow up as needed for continued or worsening symptoms

## 2020-10-07 DIAGNOSIS — M8588 Other specified disorders of bone density and structure, other site: Secondary | ICD-10-CM | POA: Diagnosis not present

## 2020-10-07 DIAGNOSIS — Z981 Arthrodesis status: Secondary | ICD-10-CM | POA: Diagnosis not present

## 2020-10-07 DIAGNOSIS — M47812 Spondylosis without myelopathy or radiculopathy, cervical region: Secondary | ICD-10-CM | POA: Diagnosis not present

## 2020-10-13 DIAGNOSIS — M1711 Unilateral primary osteoarthritis, right knee: Secondary | ICD-10-CM | POA: Diagnosis not present

## 2020-10-28 DIAGNOSIS — M47812 Spondylosis without myelopathy or radiculopathy, cervical region: Secondary | ICD-10-CM | POA: Diagnosis not present

## 2020-10-29 DIAGNOSIS — M530 Cervicocranial syndrome: Secondary | ICD-10-CM | POA: Diagnosis not present

## 2020-10-29 DIAGNOSIS — M542 Cervicalgia: Secondary | ICD-10-CM | POA: Diagnosis not present

## 2020-10-29 DIAGNOSIS — Z981 Arthrodesis status: Secondary | ICD-10-CM | POA: Diagnosis not present

## 2020-11-01 DIAGNOSIS — M542 Cervicalgia: Secondary | ICD-10-CM | POA: Diagnosis not present

## 2020-11-01 DIAGNOSIS — M530 Cervicocranial syndrome: Secondary | ICD-10-CM | POA: Diagnosis not present

## 2020-11-01 DIAGNOSIS — Z981 Arthrodesis status: Secondary | ICD-10-CM | POA: Diagnosis not present

## 2020-11-02 DIAGNOSIS — G2581 Restless legs syndrome: Secondary | ICD-10-CM | POA: Diagnosis not present

## 2020-11-02 DIAGNOSIS — F419 Anxiety disorder, unspecified: Secondary | ICD-10-CM | POA: Diagnosis not present

## 2020-11-02 DIAGNOSIS — F330 Major depressive disorder, recurrent, mild: Secondary | ICD-10-CM | POA: Diagnosis not present

## 2020-11-08 DIAGNOSIS — M530 Cervicocranial syndrome: Secondary | ICD-10-CM | POA: Diagnosis not present

## 2020-11-08 DIAGNOSIS — Z981 Arthrodesis status: Secondary | ICD-10-CM | POA: Diagnosis not present

## 2020-11-08 DIAGNOSIS — M542 Cervicalgia: Secondary | ICD-10-CM | POA: Diagnosis not present

## 2020-11-12 DIAGNOSIS — Z981 Arthrodesis status: Secondary | ICD-10-CM | POA: Diagnosis not present

## 2020-11-12 DIAGNOSIS — M530 Cervicocranial syndrome: Secondary | ICD-10-CM | POA: Diagnosis not present

## 2020-11-12 DIAGNOSIS — M542 Cervicalgia: Secondary | ICD-10-CM | POA: Diagnosis not present

## 2020-11-19 DIAGNOSIS — M62838 Other muscle spasm: Secondary | ICD-10-CM | POA: Diagnosis not present

## 2020-11-19 DIAGNOSIS — Z7982 Long term (current) use of aspirin: Secondary | ICD-10-CM | POA: Insufficient documentation

## 2020-11-19 DIAGNOSIS — I10 Essential (primary) hypertension: Secondary | ICD-10-CM | POA: Insufficient documentation

## 2020-11-19 DIAGNOSIS — Z85828 Personal history of other malignant neoplasm of skin: Secondary | ICD-10-CM | POA: Insufficient documentation

## 2020-11-19 DIAGNOSIS — Z87891 Personal history of nicotine dependence: Secondary | ICD-10-CM | POA: Diagnosis not present

## 2020-11-19 DIAGNOSIS — E119 Type 2 diabetes mellitus without complications: Secondary | ICD-10-CM | POA: Diagnosis not present

## 2020-11-19 DIAGNOSIS — Z79899 Other long term (current) drug therapy: Secondary | ICD-10-CM | POA: Insufficient documentation

## 2020-11-19 DIAGNOSIS — M542 Cervicalgia: Secondary | ICD-10-CM | POA: Diagnosis not present

## 2020-11-19 DIAGNOSIS — M530 Cervicocranial syndrome: Secondary | ICD-10-CM | POA: Diagnosis not present

## 2020-11-19 DIAGNOSIS — Z981 Arthrodesis status: Secondary | ICD-10-CM | POA: Diagnosis not present

## 2020-11-20 ENCOUNTER — Emergency Department
Admission: EM | Admit: 2020-11-20 | Discharge: 2020-11-20 | Disposition: A | Discharge status: Home / Self Care | Payer: PPO | Attending: Emergency Medicine | Admitting: Emergency Medicine

## 2020-11-20 ENCOUNTER — Other Ambulatory Visit: Payer: Self-pay

## 2020-11-20 DIAGNOSIS — M62838 Other muscle spasm: Secondary | ICD-10-CM | POA: Diagnosis not present

## 2020-11-20 LAB — CBC WITH DIFFERENTIAL/PLATELET
Abs Immature Granulocytes: 0.05 10*3/uL (ref 0.00–0.07)
Basophils Absolute: 0.1 10*3/uL (ref 0.0–0.1)
Basophils Relative: 1 %
Eosinophils Absolute: 0.3 10*3/uL (ref 0.0–0.5)
Eosinophils Relative: 3 %
HCT: 35.6 % — ABNORMAL LOW (ref 36.0–46.0)
Hemoglobin: 11.1 g/dL — ABNORMAL LOW (ref 12.0–15.0)
Immature Granulocytes: 1 %
Lymphocytes Relative: 33 %
Lymphs Abs: 2.9 10*3/uL (ref 0.7–4.0)
MCH: 26.2 pg (ref 26.0–34.0)
MCHC: 31.2 g/dL (ref 30.0–36.0)
MCV: 84 fL (ref 80.0–100.0)
Monocytes Absolute: 0.9 10*3/uL (ref 0.1–1.0)
Monocytes Relative: 10 %
Neutro Abs: 4.6 10*3/uL (ref 1.7–7.7)
Neutrophils Relative %: 52 %
Platelets: 318 10*3/uL (ref 150–400)
RBC: 4.24 MIL/uL (ref 3.87–5.11)
RDW: 13.8 % (ref 11.5–15.5)
WBC: 8.7 10*3/uL (ref 4.0–10.5)
nRBC: 0 % (ref 0.0–0.2)

## 2020-11-20 LAB — COMPREHENSIVE METABOLIC PANEL
ALT: 24 U/L (ref 0–44)
AST: 31 U/L (ref 15–41)
Albumin: 4.1 g/dL (ref 3.5–5.0)
Alkaline Phosphatase: 72 U/L (ref 38–126)
Anion gap: 8 (ref 5–15)
BUN: 27 mg/dL — ABNORMAL HIGH (ref 8–23)
CO2: 25 mmol/L (ref 22–32)
Calcium: 9.6 mg/dL (ref 8.9–10.3)
Chloride: 106 mmol/L (ref 98–111)
Creatinine, Ser: 1.19 mg/dL — ABNORMAL HIGH (ref 0.44–1.00)
GFR, Estimated: 51 mL/min — ABNORMAL LOW (ref >60)
Glucose, Bld: 155 mg/dL — ABNORMAL HIGH (ref 70–99)
Potassium: 3.7 mmol/L (ref 3.5–5.1)
Sodium: 139 mmol/L (ref 135–145)
Total Bilirubin: 0.6 mg/dL (ref 0.3–1.2)
Total Protein: 8 g/dL (ref 6.5–8.1)

## 2020-11-20 NOTE — Discharge Instructions (Addendum)
Please follow-up with your neurosurgeon as scheduled on Thursday.  Please continue your Flexeril and Nucynta as needed for pain and muscle spasms.  Your blood work today was normal with a normal white blood cell count and you had no fever.  Your incision site is completely healed and has no signs of surrounding infection.  If you develop fever of 100.4 or higher, increasing pain that is uncontrolled, numbness or weakness in your extremities, unable to hold your bowel or bladder or cannot empty your bladder, please return to the emergency department.

## 2020-11-20 NOTE — ED Triage Notes (Signed)
Reports fusion of C1 and C2 3 months ago. Reports muscles spasms on skull since surgery that have now worsened this past week. Incision site clean and well approximated. Scab noted to top of incision at base of skull. No drainage noted.

## 2020-11-20 NOTE — ED Provider Notes (Signed)
[facility] Emergency Department Provider Note  ____________________________________________   Event Date/Time   First MD Initiated Contact with Patient 11/20/20 [phone]     (approximate)  I have reviewed the triage vital signs and the nursing notes.   HISTORY  Chief Complaint Post-op Problem    HPI Erin Campos is a 65 y.o. female with history of C1-2 posterior fusion on 10/29/2019 by Dr. Izora Ribas who presents to the emergency department with concerns for infection.  She states that she has been having muscle spasms to her neck and the back of her scalp for several weeks and was told that this could be ongoing for several months.  She is taking Nucynta and Flexeril with some relief.  Tonight she felt the spasms became worse and she felt knots in the back of her neck and scalp.  She reports her husband looked at her incision and said that there was a small scab with what appeared to be green drainage.  She denies any incisional site pain.  There is no redness or warmth noted.  She has no fevers.  No focal numbness, tingling, weakness, bowel or bladder incontinence, urinary retention.  She has a follow-up with her neurosurgeon scheduled on Thursday, March 3.  She states she was just concerned for infection given husband told her he saw a small amount of green drainage.  She has not noticed any leaking of fluid.  He denies any injury but states she has been doing some increased physical exertion today which could have led to her spasms being worse.  She states this feels like her typical spasms but her normal pain medication was just not controlling it well tonight.  She became more concerned when her husband noticed the small amount of green drainage.        Past Medical History:  Diagnosis Date  . Anxiety   . Arrhythmia   . Arthritis   . Bulging lumbar disc   . Controlled diabetes mellitus ([place])    Diet controlled  . Cystocele   . DDD (degenerative disc  disease), cervical   . Depression   . External hemorrhoids   . Facial basal cell cancer   . Fibromyalgia    Diagnosed in 2010  . Heart attack ([place])    Resulted on EKG -2015  . High cholesterol   . History of kidney stones   . HTN (hypertension)   . Rectocele   . Recurrent UTI   . Sleep apnea    CPAP  . Spleen anomaly    compressed  . SVT (supraventricular tachycardia) [facility])     Patient Active Problem List   Diagnosis Date Noted  . Lumbar radiculopathy 12/18/2019  . Lumbosacral radiculitis 12/18/2019  . Osteoarthritis of knee 12/18/2019  . Primary localized osteoarthritis of pelvic region and thigh 12/18/2019  . Bursitis of hip 12/18/2019  . Post-operative pain   . Chronic bilateral low back pain with left-sided sciatica   . SVT (supraventricular tachycardia) ([place])   . Diabetes mellitus type 2 in obese ([place])   . Generalized anxiety disorder   . Leukocytosis   . Surgery, elective   . Spondylolisthesis of lumbar region 06/27/2018  . Kidney stone 03/07/2017  . Palpitations 03/07/2017  . SOB (shortness of breath) 03/07/2017  . Obstructive sleep apnea (adult) (pediatric) 08/11/2016  . Supraventricular tachycardia ([place]) 11/16/2015  . Incomplete rectal prolapse 11/16/2015  . Adrenal nodule ([place]) 09/12/2015  . Bipolar I disorder ([place]) 08/26/2015  . Essential (primary)  hypertension 08/26/2015  . Arthralgia of hip 08/26/2015  . H/O disease 08/26/2015  . Decreased potassium in the blood 08/26/2015  . Idiopathic peripheral neuropathy 08/26/2015  . Rectocele 08/26/2015  . Midline cystocele 08/26/2015  . Vaginal atrophy 08/26/2015  . Cystocele 08/26/2015  . Recurrent UTI 08/24/2015  . Microscopic hematuria 08/24/2015  . Atrophic vaginitis 08/24/2015  . Migraine without status migrainosus, not intractable 06/25/2015  . Other chronic diseases of tonsils and adenoids (CODE) 06/25/2015  . Periodic limb movement disorder 06/25/2015  . Symptomatic PVCs 03/19/2015  . Degeneration  of intervertebral disc of lumbar region 11/20/2014  . Combined fat and carbohydrate induced hyperlipemia 11/20/2014  . Anxiety 11/20/2014  . Type 2 diabetes mellitus ([place]) 02/13/2013  . Family history of thyroid disease 02/13/2013  . Hypomagnesemia 02/13/2013  . Degenerative arthritis of hip 02/06/2013    Past Surgical History:  Procedure Laterality Date  . BASAL CELL CARCINOMA EXCISION     face  . bladder tack  2004  . CARDIAC CATHETERIZATION    . CERVICAL FUSION  2020  . COSMETIC SURGERY  2014   Lip  . CYSTOSCOPY W/ RETROGRADES Bilateral 10/04/2015   Procedure: CYSTOSCOPY WITH RETROGRADE PYELOGRAM;  Surgeon: Hollice Espy, MD;  Location: [facility] ORS;  Service: Urology;  Laterality: Bilateral;  . Heart ablation    . KIDNEY STONE SURGERY     stents  . VAGINAL HYSTERECTOMY      Prior to Admission medications   Medication Sig Start Date End Date Taking? Authorizing Provider  aspirin EC 81 MG tablet Take 81 mg by mouth at bedtime.     [provider]  atenolol (TENORMIN) 25 MG tablet Take 25 mg by mouth daily. My take a second 25 mg dose as needed for PVC 09/20/14   [provider]  buPROPion (WELLBUTRIN XL) 300 MG 24 hr tablet Take 300 mg by mouth daily. 11/24/19   [provider]  Calcium Polycarbophil (FIBERCON PO) Take 1 Dose by mouth at bedtime.    [provider]  clonazePAM (KLONOPIN) 0.5 MG tablet Take 0.5 mg by mouth 2 (two) times daily as needed for anxiety. 04/30/18   [provider]  cyclobenzaprine (FLEXERIL) 5 MG tablet Take 5-10 mg by mouth 2 (two) times daily as needed for muscle spasms. 06/03/18   [provider]  docusate sodium (COLACE) 250 MG capsule Take 250 mg by mouth daily.    [provider]  estradiol (ESTRACE) 0.1 MG/GM vaginal cream Place 1 Applicatorful vaginally every Thursday. 11/20/19   Harlin Heys, MD  famciclovir (FAMVIR) 500 MG tablet Take 1 tablet (500 mg total) by mouth 3 (three) times  daily. 09/17/20   Fisher, Linden Dolin, PA-C  fenofibrate (TRICOR) 145 MG tablet Take 145 mg by mouth at bedtime.  08/06/14   [provider]  glucose blood test strip  05/09/13   [provider]  KLOR-CON M20 20 MEQ tablet Take 20 mEq by mouth daily. May take a second 10 meq dose at night as needed for leg cramps 08/06/14   [provider]  losartan (COZAAR) 50 MG tablet Take 50 mg by mouth daily. 09/01/14   [provider]  Magnesium 500 MG CAPS Take 500 mg by mouth daily.     [provider]  Melatonin 5 MG CAPS Take 5 mg by mouth at bedtime as needed (sleep).     [provider]  niacin 500 MG tablet Take 500 mg by mouth at bedtime.  [provider]  nortriptyline (PAMELOR) 75 MG capsule Take 75 mg by mouth at bedtime.  09/15/14   [provider]  oxyCODONE (OXY IR/ROXICODONE) 5 MG immediate release tablet Take 1 tablet (5 mg total) by mouth every 4 (four) hours as needed for moderate pain ((score 4 to 6)). Patient not taking: Reported on 12/17/2019 07/02/18   Newman Pies, MD  polyethylene glycol [facility] / Floria Raveling) packet Take 17 g by mouth daily.    [provider]  rOPINIRole (REQUIP) 0.5 MG tablet Take 0.5 mg by mouth at bedtime as needed (restless legs).  03/25/18   [provider]  tapentadol (NUCYNTA) 50 MG tablet Take 75 mg by mouth.    [provider]  temazepam (RESTORIL) 15 MG capsule Take 15 mg by mouth daily as needed for sleep (restless legs).  05/02/18   [provider]  triamterene-hydrochlorothiazide (MAXZIDE-25) 37.5-25 MG per tablet Take 1 tablet by mouth every morning. 08/06/14   [provider]    Allergies Ivp dye [iodinated diagnostic agents], Sulfa antibiotics, Demerol [meperidine], Influenza virus vaccine, Nsaids, Prednisone, and Ketorolac tromethamine  Family History  Problem Relation Age of Onset  . Kidney Stones Father   . Hematuria Father   . Heart  failure Father   . Heart failure Mother        Father  . Melanoma Mother   . Diabetes Mother   . Diabetes Sister   . Colon cancer Maternal Aunt   . Breast cancer Maternal Aunt 68       one aunt and one great aunt  . Diabetes Maternal Grandmother   . Ovarian cancer Neg Hx     Social History Social History   Tobacco Use  . Smoking status: Former Smoker    Packs/day: 0.25    Types: Cigarettes    Quit date: 2000    Years since quitting: 22.1  . Smokeless tobacco: Never Used  . Tobacco comment: quit 20 years  Vaping Use  . Vaping Use: Never used  Substance Use Topics  . Alcohol use: No    Alcohol/week: 0.0 standard drinks  . Drug use: No    Review of Systems Constitutional: No fever. Eyes: No visual changes. ENT: No sore throat. Cardiovascular: Denies chest pain. Respiratory: Denies shortness of breath. Gastrointestinal: No nausea, vomiting, diarrhea. Genitourinary: Negative for dysuria. Musculoskeletal: Negative for back pain. Skin: Negative for rash. Neurological: Negative for focal weakness or numbness.  ____________________________________________   PHYSICAL EXAM:  VITAL SIGNS: ED Triage Vitals  Enc Vitals Group     BP 11/20/20 0012 (!) 164/102     Pulse Rate 11/20/20 0012 86     Resp 11/20/20 0012 18     Temp 11/20/20 0012 98.1 F (36.7 C)     Temp Source 11/20/20 0012 Oral     SpO2 11/20/20 0012 99 %     Weight 11/20/20 0013 216 lb (98 kg)     Height 11/20/20 0013 5\' 7"  (1.702 m)     Head Circumference --      Peak Flow --      Pain Score 11/20/20 0013 4     Pain Loc --      Pain Edu? --      Excl. in [place]? --    CONSTITUTIONAL: Alert and oriented and responds appropriately to questions. Well-appearing; well-nourished HEAD: Normocephalic EYES: Conjunctivae clear, pupils appear equal, EOM appear intact ENT: normal nose; moist mucous membranes NECK: Supple, normal ROM, vertical incision to the  upper cervical spine without surrounding redness,  warmth, drainage or bleeding.  She does have some scaly skin noted at the base of the scalp but no open wounds.  There is no induration or fluctuance noted.  The incision site is completely healed. CARD: RRR; S1 and S2 appreciated; no murmurs, no clicks, no rubs, no gallops RESP: Normal chest excursion without splinting or tachypnea; breath sounds clear and equal bilaterally; no wheezes, no rhonchi, no rales, no hypoxia or respiratory distress, speaking full sentences ABD/GI: Normal bowel sounds; non-distended; soft, non-tender, no rebound, no guarding, no peritoneal signs, no hepatosplenomegaly BACK: The back appears normal EXT: Normal ROM in all joints; no deformity noted, no edema; no cyanosis SKIN: Normal color for age and race; warm; no rash on exposed skin NEURO: Moves all extremities equally, strength 5/5 in all 4 extremities, 2+ deep tendon reflexes in bilateral upper and lower extremities, normal speech, no facial asymmetry, ambulates with slow and steady gait, normal sensation in bilateral upper and lower extremities, no saddle anesthesia, no clonus PSYCH: The patient's mood and manner are appropriate.  ____________________________________________   LABS (all labs ordered are listed, but only abnormal results are displayed)  Labs Reviewed  CBC WITH DIFFERENTIAL/PLATELET - Abnormal; Notable for the following components:      Result Value   Hemoglobin 11.1 (*)    HCT 35.6 (*)    All other components within normal limits  COMPREHENSIVE METABOLIC PANEL - Abnormal; Notable for the following components:   Glucose, Bld 155 (*)    BUN 27 (*)    Creatinine, Ser 1.19 (*)    GFR, Estimated 51 (*)    All other components within normal limits   ____________________________________________  EKG  None ____________________________________________  RADIOLOGY I, Jahnavi Muratore, personally viewed and evaluated these images (plain radiographs) as part of my medical decision making, as well as  reviewing the written report by the radiologist.  ED MD interpretation: None  Official radiology report(s): No results found.  ____________________________________________   PROCEDURES  Procedure(s) performed (including Critical Care):  Procedures    ____________________________________________   INITIAL IMPRESSION / ASSESSMENT AND PLAN / ED COURSE  As part of my medical decision making, I reviewed the following data within the [place] notes reviewed and incorporated, Labs reviewed , Old chart reviewed and Notes from prior ED visits         Patient here with concerns for infection.  She states her husband noticed a small scab-like lesion to her incision site and saw green drainage.  On my evaluation, her surgical incision is completely closed and there is no sign of infection.  There is no open wounds that are draining or bleeding. There is no surrounding redness, warmth, induration or fluctuance. She has been afebrile at home and has been afebrile here.  She has no leukocytosis.    Unclear what her husband saw tonight but her wound looks great which seems to reassure her.    She has no focal neurologic deficits today.  She does report some increasing muscle spasms which she states she was told would happen by her neurosurgeon and she has medications at home.  She denies needing a refill.  She states that she thinks she may have done a little bit too much physical exertion today which may have made her spasms worse.  States she has been told not to take NSAIDs yet.  She drove herself to the emergency department and would like to drive home.  Unable to give her medications that may sedate her prior to discharge but she verbalizes understanding.  She has follow-up with neurosurgery scheduled in several days.  Doubt cauda equina, spinal stenosis, epidural abscess or hematoma, discitis or osteomyelitis, transverse myelitis, fracture.  I feel patient is safe  to be discharged home.  I do not feel she needs emergent imaging at this time.  She is comfortable with this plan as well.  Discussed return precautions.  Will discharge home.  At this time, I do not feel there is any life-threatening condition present. I have reviewed, interpreted and discussed all results (EKG, imaging, lab, urine as appropriate) and exam findings with patient/family. I have reviewed nursing notes and appropriate previous records.  I feel the patient is safe to be discharged home without further emergent workup and can continue workup as an outpatient as needed. Discussed usual and customary return precautions. Patient/family verbalize understanding and are comfortable with this plan.  Outpatient follow-up has been provided as needed. All questions have been answered.  ____________________________________________   FINAL CLINICAL IMPRESSION(S) / ED DIAGNOSES  Final diagnoses:  Cervical paraspinal muscle spasm     ED Discharge Orders    None      *Please note:  RAFAELLA KOLE was evaluated in Emergency Department on 11/20/2020 for the symptoms described in the history of present illness. She was evaluated in the context of the global COVID-19 pandemic, which necessitated consideration that the patient might be at risk for infection with the SARS-CoV-2 virus that causes COVID-19. Institutional protocols and algorithms that pertain to the evaluation of patients at risk for COVID-19 are in a state of rapid change based on information released by regulatory bodies including the CDC and federal and state organizations. These policies and algorithms were followed during the patient's care in the ED.  Some ED evaluations and interventions may be delayed as a result of limited staffing during and the pandemic.*   Note:  This document was prepared using Dragon voice recognition software and may include unintentional dictation errors.   Shayann Garbutt, Delice Bison, DO 11/20/20 [phone]

## 2020-11-20 NOTE — ED Notes (Signed)
Pt sitting in lobby in no acute distress, on phone.

## 2020-11-22 DIAGNOSIS — M530 Cervicocranial syndrome: Secondary | ICD-10-CM | POA: Diagnosis not present

## 2020-11-22 DIAGNOSIS — M542 Cervicalgia: Secondary | ICD-10-CM | POA: Diagnosis not present

## 2020-11-22 DIAGNOSIS — Z981 Arthrodesis status: Secondary | ICD-10-CM | POA: Diagnosis not present

## 2020-11-25 DIAGNOSIS — Z981 Arthrodesis status: Secondary | ICD-10-CM | POA: Diagnosis not present

## 2020-11-25 DIAGNOSIS — M542 Cervicalgia: Secondary | ICD-10-CM | POA: Diagnosis not present

## 2020-11-25 DIAGNOSIS — M47812 Spondylosis without myelopathy or radiculopathy, cervical region: Secondary | ICD-10-CM | POA: Diagnosis not present

## 2020-11-25 DIAGNOSIS — M4322 Fusion of spine, cervical region: Secondary | ICD-10-CM | POA: Diagnosis not present

## 2020-11-25 DIAGNOSIS — M530 Cervicocranial syndrome: Secondary | ICD-10-CM | POA: Diagnosis not present

## 2020-12-01 DIAGNOSIS — M542 Cervicalgia: Secondary | ICD-10-CM | POA: Diagnosis not present

## 2020-12-01 DIAGNOSIS — M530 Cervicocranial syndrome: Secondary | ICD-10-CM | POA: Diagnosis not present

## 2020-12-01 DIAGNOSIS — Z981 Arthrodesis status: Secondary | ICD-10-CM | POA: Diagnosis not present

## 2020-12-06 DIAGNOSIS — Z981 Arthrodesis status: Secondary | ICD-10-CM | POA: Diagnosis not present

## 2020-12-06 DIAGNOSIS — M530 Cervicocranial syndrome: Secondary | ICD-10-CM | POA: Diagnosis not present

## 2020-12-06 DIAGNOSIS — M542 Cervicalgia: Secondary | ICD-10-CM | POA: Diagnosis not present

## 2020-12-08 DIAGNOSIS — Z981 Arthrodesis status: Secondary | ICD-10-CM | POA: Diagnosis not present

## 2020-12-08 DIAGNOSIS — M542 Cervicalgia: Secondary | ICD-10-CM | POA: Diagnosis not present

## 2020-12-08 DIAGNOSIS — M530 Cervicocranial syndrome: Secondary | ICD-10-CM | POA: Diagnosis not present

## 2020-12-13 DIAGNOSIS — M530 Cervicocranial syndrome: Secondary | ICD-10-CM | POA: Diagnosis not present

## 2020-12-13 DIAGNOSIS — M542 Cervicalgia: Secondary | ICD-10-CM | POA: Diagnosis not present

## 2020-12-13 DIAGNOSIS — Z981 Arthrodesis status: Secondary | ICD-10-CM | POA: Diagnosis not present

## 2020-12-22 DIAGNOSIS — Z981 Arthrodesis status: Secondary | ICD-10-CM | POA: Diagnosis not present

## 2020-12-22 DIAGNOSIS — M530 Cervicocranial syndrome: Secondary | ICD-10-CM | POA: Diagnosis not present

## 2020-12-22 DIAGNOSIS — M542 Cervicalgia: Secondary | ICD-10-CM | POA: Diagnosis not present

## 2020-12-27 DIAGNOSIS — Z981 Arthrodesis status: Secondary | ICD-10-CM | POA: Diagnosis not present

## 2020-12-27 DIAGNOSIS — M530 Cervicocranial syndrome: Secondary | ICD-10-CM | POA: Diagnosis not present

## 2020-12-27 DIAGNOSIS — M542 Cervicalgia: Secondary | ICD-10-CM | POA: Diagnosis not present

## 2020-12-31 DIAGNOSIS — M530 Cervicocranial syndrome: Secondary | ICD-10-CM | POA: Diagnosis not present

## 2020-12-31 DIAGNOSIS — Z981 Arthrodesis status: Secondary | ICD-10-CM | POA: Diagnosis not present

## 2020-12-31 DIAGNOSIS — M542 Cervicalgia: Secondary | ICD-10-CM | POA: Diagnosis not present

## 2021-01-04 ENCOUNTER — Other Ambulatory Visit: Payer: Self-pay | Admitting: Family Medicine

## 2021-01-04 DIAGNOSIS — Z1231 Encounter for screening mammogram for malignant neoplasm of breast: Secondary | ICD-10-CM

## 2021-01-11 DIAGNOSIS — M5412 Radiculopathy, cervical region: Secondary | ICD-10-CM | POA: Diagnosis not present

## 2021-01-11 DIAGNOSIS — M542 Cervicalgia: Secondary | ICD-10-CM | POA: Diagnosis not present

## 2021-01-11 DIAGNOSIS — M47812 Spondylosis without myelopathy or radiculopathy, cervical region: Secondary | ICD-10-CM | POA: Diagnosis not present

## 2021-01-13 ENCOUNTER — Ambulatory Visit: Payer: PPO

## 2021-01-14 DIAGNOSIS — Z981 Arthrodesis status: Secondary | ICD-10-CM | POA: Diagnosis not present

## 2021-01-14 DIAGNOSIS — M542 Cervicalgia: Secondary | ICD-10-CM | POA: Diagnosis not present

## 2021-01-14 DIAGNOSIS — M530 Cervicocranial syndrome: Secondary | ICD-10-CM | POA: Diagnosis not present

## 2021-01-17 DIAGNOSIS — M530 Cervicocranial syndrome: Secondary | ICD-10-CM | POA: Diagnosis not present

## 2021-01-17 DIAGNOSIS — M542 Cervicalgia: Secondary | ICD-10-CM | POA: Diagnosis not present

## 2021-01-17 DIAGNOSIS — Z981 Arthrodesis status: Secondary | ICD-10-CM | POA: Diagnosis not present

## 2021-01-21 DIAGNOSIS — M530 Cervicocranial syndrome: Secondary | ICD-10-CM | POA: Diagnosis not present

## 2021-01-21 DIAGNOSIS — Z981 Arthrodesis status: Secondary | ICD-10-CM | POA: Diagnosis not present

## 2021-01-21 DIAGNOSIS — M542 Cervicalgia: Secondary | ICD-10-CM | POA: Diagnosis not present

## 2021-01-24 ENCOUNTER — Ambulatory Visit
Admission: RE | Admit: 2021-01-24 | Discharge: 2021-01-24 | Disposition: A | Discharge status: Home / Self Care | Payer: PPO | Source: Ambulatory Visit | Attending: Family Medicine | Admitting: Family Medicine

## 2021-01-24 ENCOUNTER — Other Ambulatory Visit: Payer: Self-pay

## 2021-01-24 DIAGNOSIS — Z1231 Encounter for screening mammogram for malignant neoplasm of breast: Secondary | ICD-10-CM | POA: Diagnosis not present

## 2021-01-26 DIAGNOSIS — R21 Rash and other nonspecific skin eruption: Secondary | ICD-10-CM | POA: Diagnosis not present

## 2021-02-07 DIAGNOSIS — E119 Type 2 diabetes mellitus without complications: Secondary | ICD-10-CM | POA: Diagnosis not present

## 2021-02-15 DIAGNOSIS — L814 Other melanin hyperpigmentation: Secondary | ICD-10-CM | POA: Diagnosis not present

## 2021-02-15 DIAGNOSIS — D1801 Hemangioma of skin and subcutaneous tissue: Secondary | ICD-10-CM | POA: Diagnosis not present

## 2021-02-15 DIAGNOSIS — L578 Other skin changes due to chronic exposure to nonionizing radiation: Secondary | ICD-10-CM | POA: Diagnosis not present

## 2021-02-15 DIAGNOSIS — L821 Other seborrheic keratosis: Secondary | ICD-10-CM | POA: Diagnosis not present

## 2021-02-15 DIAGNOSIS — L812 Freckles: Secondary | ICD-10-CM | POA: Diagnosis not present

## 2021-02-15 DIAGNOSIS — I8393 Asymptomatic varicose veins of bilateral lower extremities: Secondary | ICD-10-CM | POA: Diagnosis not present

## 2021-02-15 DIAGNOSIS — B351 Tinea unguium: Secondary | ICD-10-CM | POA: Diagnosis not present

## 2021-02-24 DIAGNOSIS — I10 Essential (primary) hypertension: Secondary | ICD-10-CM | POA: Diagnosis not present

## 2021-02-24 DIAGNOSIS — E1122 Type 2 diabetes mellitus with diabetic chronic kidney disease: Secondary | ICD-10-CM | POA: Diagnosis not present

## 2021-02-24 DIAGNOSIS — F330 Major depressive disorder, recurrent, mild: Secondary | ICD-10-CM | POA: Diagnosis not present

## 2021-02-24 DIAGNOSIS — M5412 Radiculopathy, cervical region: Secondary | ICD-10-CM | POA: Diagnosis not present

## 2021-02-24 DIAGNOSIS — G2581 Restless legs syndrome: Secondary | ICD-10-CM | POA: Diagnosis not present

## 2021-02-24 DIAGNOSIS — K219 Gastro-esophageal reflux disease without esophagitis: Secondary | ICD-10-CM | POA: Diagnosis not present

## 2021-02-24 DIAGNOSIS — N1831 Chronic kidney disease, stage 3a: Secondary | ICD-10-CM | POA: Diagnosis not present

## 2021-02-24 DIAGNOSIS — G4733 Obstructive sleep apnea (adult) (pediatric): Secondary | ICD-10-CM | POA: Diagnosis not present

## 2021-02-24 DIAGNOSIS — M5136 Other intervertebral disc degeneration, lumbar region: Secondary | ICD-10-CM | POA: Diagnosis not present

## 2021-02-24 DIAGNOSIS — F419 Anxiety disorder, unspecified: Secondary | ICD-10-CM | POA: Diagnosis not present

## 2021-02-24 DIAGNOSIS — I471 Supraventricular tachycardia: Secondary | ICD-10-CM | POA: Diagnosis not present

## 2021-02-24 DIAGNOSIS — E782 Mixed hyperlipidemia: Secondary | ICD-10-CM | POA: Diagnosis not present

## 2021-03-15 DIAGNOSIS — N1831 Chronic kidney disease, stage 3a: Secondary | ICD-10-CM | POA: Diagnosis not present

## 2021-03-15 DIAGNOSIS — E1122 Type 2 diabetes mellitus with diabetic chronic kidney disease: Secondary | ICD-10-CM | POA: Diagnosis not present

## 2021-03-15 DIAGNOSIS — R7989 Other specified abnormal findings of blood chemistry: Secondary | ICD-10-CM | POA: Diagnosis not present

## 2021-03-31 DIAGNOSIS — N1831 Chronic kidney disease, stage 3a: Secondary | ICD-10-CM | POA: Diagnosis not present

## 2021-03-31 DIAGNOSIS — D509 Iron deficiency anemia, unspecified: Secondary | ICD-10-CM | POA: Diagnosis not present

## 2021-03-31 DIAGNOSIS — E538 Deficiency of other specified B group vitamins: Secondary | ICD-10-CM | POA: Diagnosis not present

## 2021-04-05 DIAGNOSIS — M47812 Spondylosis without myelopathy or radiculopathy, cervical region: Secondary | ICD-10-CM | POA: Diagnosis not present

## 2021-04-05 DIAGNOSIS — M5412 Radiculopathy, cervical region: Secondary | ICD-10-CM | POA: Diagnosis not present

## 2021-04-05 DIAGNOSIS — M62838 Other muscle spasm: Secondary | ICD-10-CM | POA: Diagnosis not present

## 2021-04-05 DIAGNOSIS — M5416 Radiculopathy, lumbar region: Secondary | ICD-10-CM | POA: Diagnosis not present

## 2021-04-21 DIAGNOSIS — K219 Gastro-esophageal reflux disease without esophagitis: Secondary | ICD-10-CM | POA: Diagnosis not present

## 2021-04-21 DIAGNOSIS — G4733 Obstructive sleep apnea (adult) (pediatric): Secondary | ICD-10-CM | POA: Diagnosis not present

## 2021-04-21 DIAGNOSIS — E782 Mixed hyperlipidemia: Secondary | ICD-10-CM | POA: Diagnosis not present

## 2021-04-21 DIAGNOSIS — E669 Obesity, unspecified: Secondary | ICD-10-CM | POA: Diagnosis not present

## 2021-04-21 DIAGNOSIS — Z9889 Other specified postprocedural states: Secondary | ICD-10-CM | POA: Diagnosis not present

## 2021-04-21 DIAGNOSIS — E119 Type 2 diabetes mellitus without complications: Secondary | ICD-10-CM | POA: Diagnosis not present

## 2021-04-21 DIAGNOSIS — N189 Chronic kidney disease, unspecified: Secondary | ICD-10-CM | POA: Diagnosis not present

## 2021-04-21 DIAGNOSIS — R42 Dizziness and giddiness: Secondary | ICD-10-CM | POA: Diagnosis not present

## 2021-04-21 DIAGNOSIS — I10 Essential (primary) hypertension: Secondary | ICD-10-CM | POA: Diagnosis not present

## 2021-04-24 DIAGNOSIS — M25562 Pain in left knee: Secondary | ICD-10-CM | POA: Diagnosis not present

## 2021-05-03 DIAGNOSIS — M2550 Pain in unspecified joint: Secondary | ICD-10-CM | POA: Diagnosis not present

## 2021-05-03 DIAGNOSIS — K121 Other forms of stomatitis: Secondary | ICD-10-CM | POA: Diagnosis not present

## 2021-05-03 DIAGNOSIS — D509 Iron deficiency anemia, unspecified: Secondary | ICD-10-CM | POA: Diagnosis not present

## 2021-05-03 DIAGNOSIS — G609 Hereditary and idiopathic neuropathy, unspecified: Secondary | ICD-10-CM | POA: Diagnosis not present

## 2021-05-03 DIAGNOSIS — I10 Essential (primary) hypertension: Secondary | ICD-10-CM | POA: Diagnosis not present

## 2021-05-03 DIAGNOSIS — R509 Fever, unspecified: Secondary | ICD-10-CM | POA: Diagnosis not present

## 2021-05-27 DIAGNOSIS — M2550 Pain in unspecified joint: Secondary | ICD-10-CM | POA: Diagnosis not present

## 2021-05-27 DIAGNOSIS — R509 Fever, unspecified: Secondary | ICD-10-CM | POA: Diagnosis not present

## 2021-05-27 DIAGNOSIS — M064 Inflammatory polyarthropathy: Secondary | ICD-10-CM | POA: Diagnosis not present

## 2021-05-27 DIAGNOSIS — M159 Polyosteoarthritis, unspecified: Secondary | ICD-10-CM | POA: Diagnosis not present

## 2021-05-27 DIAGNOSIS — M3500 Sicca syndrome, unspecified: Secondary | ICD-10-CM | POA: Diagnosis not present

## 2021-07-06 DIAGNOSIS — M47812 Spondylosis without myelopathy or radiculopathy, cervical region: Secondary | ICD-10-CM | POA: Diagnosis not present

## 2021-07-06 DIAGNOSIS — M62838 Other muscle spasm: Secondary | ICD-10-CM | POA: Diagnosis not present

## 2021-07-12 DIAGNOSIS — M064 Inflammatory polyarthropathy: Secondary | ICD-10-CM | POA: Diagnosis not present

## 2021-07-12 DIAGNOSIS — Z7960 Long term (current) use of unspecified immunomodulators and immunosuppressants: Secondary | ICD-10-CM | POA: Diagnosis not present

## 2021-07-28 DIAGNOSIS — M4322 Fusion of spine, cervical region: Secondary | ICD-10-CM | POA: Diagnosis not present

## 2021-07-28 DIAGNOSIS — Z981 Arthrodesis status: Secondary | ICD-10-CM | POA: Diagnosis not present

## 2021-09-08 DIAGNOSIS — F319 Bipolar disorder, unspecified: Secondary | ICD-10-CM | POA: Diagnosis not present

## 2021-09-08 DIAGNOSIS — E559 Vitamin D deficiency, unspecified: Secondary | ICD-10-CM | POA: Diagnosis not present

## 2021-09-08 DIAGNOSIS — I493 Ventricular premature depolarization: Secondary | ICD-10-CM | POA: Diagnosis not present

## 2021-09-08 DIAGNOSIS — E782 Mixed hyperlipidemia: Secondary | ICD-10-CM | POA: Diagnosis not present

## 2021-09-08 DIAGNOSIS — I471 Supraventricular tachycardia: Secondary | ICD-10-CM | POA: Diagnosis not present

## 2021-09-08 DIAGNOSIS — R002 Palpitations: Secondary | ICD-10-CM | POA: Diagnosis not present

## 2021-09-08 DIAGNOSIS — Z1329 Encounter for screening for other suspected endocrine disorder: Secondary | ICD-10-CM | POA: Diagnosis not present

## 2021-09-08 DIAGNOSIS — I10 Essential (primary) hypertension: Secondary | ICD-10-CM | POA: Diagnosis not present

## 2021-09-08 DIAGNOSIS — E1122 Type 2 diabetes mellitus with diabetic chronic kidney disease: Secondary | ICD-10-CM | POA: Diagnosis not present

## 2021-09-08 DIAGNOSIS — G4733 Obstructive sleep apnea (adult) (pediatric): Secondary | ICD-10-CM | POA: Diagnosis not present

## 2021-09-08 DIAGNOSIS — G609 Hereditary and idiopathic neuropathy, unspecified: Secondary | ICD-10-CM | POA: Diagnosis not present

## 2021-09-08 DIAGNOSIS — D509 Iron deficiency anemia, unspecified: Secondary | ICD-10-CM | POA: Diagnosis not present

## 2021-09-08 DIAGNOSIS — N1831 Chronic kidney disease, stage 3a: Secondary | ICD-10-CM | POA: Diagnosis not present

## 2021-09-08 DIAGNOSIS — Z0000 Encounter for general adult medical examination without abnormal findings: Secondary | ICD-10-CM | POA: Diagnosis not present

## 2021-09-08 DIAGNOSIS — F419 Anxiety disorder, unspecified: Secondary | ICD-10-CM | POA: Diagnosis not present

## 2021-09-08 DIAGNOSIS — E538 Deficiency of other specified B group vitamins: Secondary | ICD-10-CM | POA: Diagnosis not present

## 2021-09-08 DIAGNOSIS — Z23 Encounter for immunization: Secondary | ICD-10-CM | POA: Diagnosis not present

## 2021-09-12 DIAGNOSIS — Z7960 Long term (current) use of unspecified immunomodulators and immunosuppressants: Secondary | ICD-10-CM | POA: Diagnosis not present

## 2021-09-12 DIAGNOSIS — M064 Inflammatory polyarthropathy: Secondary | ICD-10-CM | POA: Diagnosis not present

## 2021-09-12 DIAGNOSIS — N1831 Chronic kidney disease, stage 3a: Secondary | ICD-10-CM | POA: Diagnosis not present

## 2021-09-28 DIAGNOSIS — F1120 Opioid dependence, uncomplicated: Secondary | ICD-10-CM | POA: Diagnosis not present

## 2021-09-28 DIAGNOSIS — M62838 Other muscle spasm: Secondary | ICD-10-CM | POA: Diagnosis not present

## 2021-09-28 DIAGNOSIS — M47812 Spondylosis without myelopathy or radiculopathy, cervical region: Secondary | ICD-10-CM | POA: Diagnosis not present

## 2021-09-28 DIAGNOSIS — M4316 Spondylolisthesis, lumbar region: Secondary | ICD-10-CM | POA: Diagnosis not present

## 2021-10-07 DIAGNOSIS — E119 Type 2 diabetes mellitus without complications: Secondary | ICD-10-CM | POA: Diagnosis not present

## 2021-10-10 DIAGNOSIS — E538 Deficiency of other specified B group vitamins: Secondary | ICD-10-CM | POA: Diagnosis not present

## 2021-10-20 DIAGNOSIS — E782 Mixed hyperlipidemia: Secondary | ICD-10-CM | POA: Diagnosis not present

## 2021-10-20 DIAGNOSIS — I272 Pulmonary hypertension, unspecified: Secondary | ICD-10-CM | POA: Diagnosis not present

## 2021-10-20 DIAGNOSIS — G4733 Obstructive sleep apnea (adult) (pediatric): Secondary | ICD-10-CM | POA: Diagnosis not present

## 2021-10-20 DIAGNOSIS — Z9889 Other specified postprocedural states: Secondary | ICD-10-CM | POA: Diagnosis not present

## 2021-10-20 DIAGNOSIS — I471 Supraventricular tachycardia: Secondary | ICD-10-CM | POA: Diagnosis not present

## 2021-10-20 DIAGNOSIS — I10 Essential (primary) hypertension: Secondary | ICD-10-CM | POA: Diagnosis not present

## 2021-10-20 DIAGNOSIS — Z8679 Personal history of other diseases of the circulatory system: Secondary | ICD-10-CM | POA: Diagnosis not present

## 2021-10-20 DIAGNOSIS — Z01818 Encounter for other preprocedural examination: Secondary | ICD-10-CM | POA: Diagnosis not present

## 2021-10-20 DIAGNOSIS — E119 Type 2 diabetes mellitus without complications: Secondary | ICD-10-CM | POA: Diagnosis not present

## 2021-10-21 DIAGNOSIS — M65321 Trigger finger, right index finger: Secondary | ICD-10-CM | POA: Diagnosis not present

## 2021-10-27 DIAGNOSIS — H2512 Age-related nuclear cataract, left eye: Secondary | ICD-10-CM | POA: Diagnosis not present

## 2021-11-09 ENCOUNTER — Encounter: Payer: Self-pay | Admitting: Ophthalmology

## 2021-11-10 DIAGNOSIS — E538 Deficiency of other specified B group vitamins: Secondary | ICD-10-CM | POA: Diagnosis not present

## 2021-11-11 DIAGNOSIS — R399 Unspecified symptoms and signs involving the genitourinary system: Secondary | ICD-10-CM | POA: Diagnosis not present

## 2021-11-14 DIAGNOSIS — M3505 Sjogren syndrome with inflammatory arthritis: Secondary | ICD-10-CM | POA: Diagnosis not present

## 2021-11-14 DIAGNOSIS — N1831 Chronic kidney disease, stage 3a: Secondary | ICD-10-CM | POA: Diagnosis not present

## 2021-11-14 DIAGNOSIS — Z7960 Long term (current) use of unspecified immunomodulators and immunosuppressants: Secondary | ICD-10-CM | POA: Diagnosis not present

## 2021-11-14 NOTE — Discharge Instructions (Signed)

## 2021-11-16 ENCOUNTER — Encounter
Admission: RE | Disposition: A | Discharge status: Home / Self Care | Payer: Self-pay | Source: Home / Self Care | Attending: Ophthalmology

## 2021-11-16 ENCOUNTER — Ambulatory Visit: Payer: PPO | Admitting: Anesthesiology

## 2021-11-16 ENCOUNTER — Other Ambulatory Visit: Payer: Self-pay

## 2021-11-16 ENCOUNTER — Ambulatory Visit
Admission: RE | Admit: 2021-11-16 | Discharge: 2021-11-16 | Disposition: A | Discharge status: Home / Self Care | Payer: PPO | Attending: Ophthalmology | Admitting: Ophthalmology

## 2021-11-16 ENCOUNTER — Encounter: Payer: Self-pay | Admitting: Ophthalmology

## 2021-11-16 DIAGNOSIS — K219 Gastro-esophageal reflux disease without esophagitis: Secondary | ICD-10-CM | POA: Insufficient documentation

## 2021-11-16 DIAGNOSIS — I10 Essential (primary) hypertension: Secondary | ICD-10-CM | POA: Diagnosis not present

## 2021-11-16 DIAGNOSIS — I471 Supraventricular tachycardia: Secondary | ICD-10-CM | POA: Insufficient documentation

## 2021-11-16 DIAGNOSIS — E1136 Type 2 diabetes mellitus with diabetic cataract: Secondary | ICD-10-CM | POA: Insufficient documentation

## 2021-11-16 DIAGNOSIS — H2512 Age-related nuclear cataract, left eye: Secondary | ICD-10-CM | POA: Insufficient documentation

## 2021-11-16 DIAGNOSIS — G4730 Sleep apnea, unspecified: Secondary | ICD-10-CM | POA: Insufficient documentation

## 2021-11-16 DIAGNOSIS — H25812 Combined forms of age-related cataract, left eye: Secondary | ICD-10-CM | POA: Diagnosis not present

## 2021-11-16 HISTORY — PX: CATARACT EXTRACTION W/PHACO: SHX586

## 2021-11-16 HISTORY — DX: Gastro-esophageal reflux disease without esophagitis: K21.9

## 2021-11-16 LAB — GLUCOSE, CAPILLARY
Glucose-Capillary: 101 mg/dL — ABNORMAL HIGH (ref 70–99)
Glucose-Capillary: 115 mg/dL — ABNORMAL HIGH (ref 70–99)

## 2021-11-16 SURGERY — PHACOEMULSIFICATION, CATARACT, WITH IOL INSERTION
Anesthesia: Monitor Anesthesia Care | Site: Eye | Laterality: Left

## 2021-11-16 MED ORDER — SIGHTPATH DOSE#1 BSS IO SOLN
INTRAOCULAR | Status: DC | PRN
  Administered 2021-11-16: 15 mL

## 2021-11-16 MED ORDER — MIDAZOLAM HCL 2 MG/2ML IJ SOLN
INTRAMUSCULAR | Status: DC | PRN
Start: 2021-11-16 — End: 2021-11-16
  Administered 2021-11-16: 2 mg via INTRAVENOUS

## 2021-11-16 MED ORDER — TETRACAINE HCL 0.5 % OP SOLN
1.0000 [drp] | OPHTHALMIC | Status: DC | PRN
  Administered 2021-11-16 (×3): 1 [drp] via OPHTHALMIC

## 2021-11-16 MED ORDER — SIGHTPATH DOSE#1 NA HYALUR & NA CHOND-NA HYALUR IO KIT
PACK | INTRAOCULAR | Status: DC | PRN
  Administered 2021-11-16: 1 via OPHTHALMIC

## 2021-11-16 MED ORDER — FENTANYL CITRATE (PF) 100 MCG/2ML IJ SOLN
INTRAMUSCULAR | Status: DC | PRN
  Administered 2021-11-16: 50 ug via INTRAVENOUS

## 2021-11-16 MED ORDER — CEFUROXIME OPHTHALMIC INJECTION 1 MG/0.1 ML
INJECTION | OPHTHALMIC | Status: DC | PRN
  Administered 2021-11-16: 0.1 mL via INTRACAMERAL

## 2021-11-16 MED ORDER — SIGHTPATH DOSE#1 BSS IO SOLN
INTRAOCULAR | Status: DC | PRN
  Administered 2021-11-16: 1 mL via INTRAMUSCULAR

## 2021-11-16 MED ORDER — SIGHTPATH DOSE#1 BSS IO SOLN
INTRAOCULAR | Status: DC | PRN
  Administered 2021-11-16: 46 mL via OPHTHALMIC

## 2021-11-16 MED ORDER — BRIMONIDINE TARTRATE-TIMOLOL 0.2-0.5 % OP SOLN
OPHTHALMIC | Status: DC | PRN
  Administered 2021-11-16: 1 [drp] via OPHTHALMIC

## 2021-11-16 MED ORDER — ARMC OPHTHALMIC DILATING DROPS
1.0000 "application " | OPHTHALMIC | Status: DC | PRN
  Administered 2021-11-16 (×3): 1 via OPHTHALMIC

## 2021-11-16 MED ORDER — LACTATED RINGERS IV SOLN: INTRAVENOUS | Status: DC

## 2021-11-16 SURGICAL SUPPLY — 13 items
CANNULA ANT/CHMB 27G (MISCELLANEOUS) IMPLANT
CANNULA ANT/CHMB 27GA (MISCELLANEOUS) IMPLANT
CATARACT SUITE SIGHTPATH (MISCELLANEOUS) ×2 IMPLANT
FEE CATARACT SUITE SIGHTPATH (MISCELLANEOUS) ×1 IMPLANT
GLOVE SRG 8 PF TXTR STRL LF DI (GLOVE) ×1 IMPLANT
GLOVE SURG ENC TEXT LTX SZ7.5 (GLOVE) ×2 IMPLANT
GLOVE SURG UNDER POLY LF SZ8 (GLOVE) ×2
LENS IOL TECNIS EYHANCE 22.0 (Intraocular Lens) ×1 IMPLANT
NDL FILTER BLUNT 18X1 1/2 (NEEDLE) ×1 IMPLANT
NEEDLE FILTER BLUNT 18X 1/2SAF (NEEDLE) ×1
NEEDLE FILTER BLUNT 18X1 1/2 (NEEDLE) ×1 IMPLANT
SYR 3ML LL SCALE MARK (SYRINGE) ×2 IMPLANT
WATER STERILE IRR 250ML POUR (IV SOLUTION) ×2 IMPLANT

## 2021-11-16 NOTE — Transfer of Care (Signed)
Immediate Anesthesia Transfer of Care Note  Patient: Erin Campos  Procedure(s) Performed: CATARACT EXTRACTION PHACO AND INTRAOCULAR LENS PLACEMENT (IOC) LEFT  DIABETIC 5.83 00:47.3  (Left: Eye)  Patient Location: PACU  Anesthesia Type: MAC  Level of Consciousness: awake, alert  and patient cooperative  Airway and Oxygen Therapy: Patient Spontanous Breathing and Patient connected to supplemental oxygen  Post-op Assessment: Post-op Vital signs reviewed, Patient's Cardiovascular Status Stable, Respiratory Function Stable, Patent Airway and No signs of Nausea or vomiting  Post-op Vital Signs: Reviewed and stable  Complications: No notable events documented.

## 2021-11-16 NOTE — H&P (Signed)
[facility]   Primary Care Physician:  Latanya Maudlin, NP Ophthalmologist: Dr. Leandrew Koyanagi  Pre-Procedure History & Physical: HPI:  AURIELLA Campos is a 66 y.o. female here for ophthalmic surgery.   Past Medical History:  Diagnosis Date   Anxiety    Arrhythmia    Arthritis    Bulging lumbar disc    Controlled diabetes mellitus ([place])    Diet controlled   Cystocele    DDD (degenerative disc disease), cervical    Depression    External hemorrhoids    Facial basal cell cancer    Fibromyalgia    Diagnosed in 2010   GERD (gastroesophageal reflux disease)    Heart attack ([place])    Resulted on EKG -2015   High cholesterol    History of kidney stones    HTN (hypertension)    Rectocele    Recurrent UTI    Sleep apnea    CPAP   Spleen anomaly    compressed   SVT (supraventricular tachycardia) ([place])     Past Surgical History:  Procedure Laterality Date   BASAL CELL CARCINOMA EXCISION     face   bladder tack  2004   CARDIAC CATHETERIZATION     CERVICAL FUSION  2020   COSMETIC SURGERY  2014   Lip   CYSTOSCOPY W/ RETROGRADES Bilateral 10/04/2015   Procedure: CYSTOSCOPY WITH RETROGRADE PYELOGRAM;  Surgeon: Hollice Espy, MD;  Location: [facility] ORS;  Service: Urology;  Laterality: Bilateral;   Heart ablation     KIDNEY STONE SURGERY     stents   VAGINAL HYSTERECTOMY      Prior to Admission medications   Medication Sig Start Date End Date Taking? Authorizing Provider  amLODipine (NORVASC) 5 MG tablet Take 5 mg by mouth daily.   Yes [provider]  aspirin EC 81 MG tablet Take 81 mg by mouth at bedtime.    Yes [provider]  atenolol (TENORMIN) 25 MG tablet Take 25 mg by mouth 2 (two) times daily. My take a second 25 mg dose as needed for PVC 09/20/14  Yes [provider]  buPROPion (WELLBUTRIN XL) 300 MG 24 hr tablet Take 300 mg by mouth daily. 11/24/19  Yes [provider]  clonazePAM (KLONOPIN) 0.5 MG tablet Take 0.5  mg by mouth 2 (two) times daily as needed for anxiety. 04/30/18  Yes [provider]  Cyanocobalamin (VITAMIN B-12 IJ) Inject 1,000 mcg as directed every 28 (twenty-eight) days.   Yes [provider]  cyclobenzaprine (FLEXERIL) 5 MG tablet Take 5-10 mg by mouth 2 (two) times daily as needed for muscle spasms. 06/03/18  Yes [provider]  ergocalciferol (VITAMIN D2) 1.25 MG (50000 UT) capsule Take 50,000 Units by mouth once a week.   Yes [provider]  estradiol (ESTRACE) 0.1 MG/GM vaginal cream Place 1 Applicatorful vaginally every Thursday. 11/20/19  Yes Harlin Heys, MD  fenofibrate (TRICOR) 145 MG tablet Take 145 mg by mouth at bedtime.  08/06/14  Yes [provider]  glucose blood test strip  05/09/13  Yes [provider]  KLOR-CON M20 20 MEQ tablet Take 20 mEq by mouth daily. May take a second 10 meq dose at night as needed for leg cramps 08/06/14  Yes [provider]  losartan (COZAAR) 50 MG tablet Take 50 mg by mouth daily. 09/01/14  Yes [provider]  Magnesium 500 MG CAPS Take 500 mg by mouth daily.    Yes [provider]  naloxone (NARCAN) nasal spray 4 mg/0.1 mL Place 1 spray into the nose as needed.   Yes [provider]  niacin 500 MG tablet Take 500 mg by mouth at bedtime.   Yes [provider]  nortriptyline (PAMELOR) 75 MG capsule Take 75 mg by mouth at bedtime.  09/15/14  Yes [provider]  omeprazole (PRILOSEC) 40 MG capsule Take 40 mg by mouth daily.   Yes [provider]  polyethylene glycol (MIRALAX / GLYCOLAX) packet Take 17 g by mouth daily.   Yes [provider]  QUERCETIN PO Take by mouth daily.   Yes [provider]  rOPINIRole (REQUIP) 0.5 MG tablet Take 0.5 mg by mouth at bedtime as needed (restless legs).  03/25/18  Yes [provider]  tapentadol (NUCYNTA) 50 MG tablet Take 75 mg by mouth every 8 (eight) hours as needed.    Yes [provider]  TART CHERRY PO Take by mouth daily.   Yes [provider]  triamterene-hydrochlorothiazide (MAXZIDE-25) 37.5-25 MG per tablet Take 1 tablet by mouth every morning. 08/06/14  Yes [provider]    Allergies as of 10/11/2021 - Review Complete 11/20/2020  Allergen Reaction Noted   Ivp dye [iodinated contrast media] Anaphylaxis and Other (See Comments) 12/17/2019   Sulfa antibiotics Hives and Shortness Of Breath 09/27/2014   Demerol [meperidine] Other (See Comments) 09/27/2014   Influenza virus vaccine Other (See Comments) 08/24/2015   Nsaids Palpitations and Other (See Comments) 08/24/2015   Prednisone Other (See Comments) and Hypertension 09/19/2016   Ketorolac tromethamine Palpitations 08/24/2015    Family History  Problem Relation Age of Onset   Kidney Stones Father    Hematuria Father    Heart failure Father    Heart failure Mother        Father   Melanoma Mother    Diabetes Mother    Diabetes Sister    Colon cancer Maternal Aunt    Breast cancer Maternal Aunt 57       one aunt and one great aunt   Diabetes Maternal Grandmother    Ovarian cancer Neg Hx     Social History   Socioeconomic History   Marital status: Married    Spouse name: Not on file   Number of children: Not on file   Years of education: Not on file   Highest education level: Not on file  Occupational History   Not on file  Tobacco Use   Smoking status: Former    Packs/day: 0.25    Types: Cigarettes    Quit date: 2000    Years since quitting: 23.1   Smokeless tobacco: Never   Tobacco comments:    quit 20 years  Vaping Use   Vaping Use: Never used  Substance and Sexual Activity   Alcohol use: No    Alcohol/week: 0.0 standard drinks   Drug use: No   Sexual activity: Yes    Birth control/protection: Surgical  Other Topics Concern   Not on file  Social History Narrative   Not on file   Social Determinants of Health   Financial Resource  Strain: Not on file  Food Insecurity: Not on file  Transportation Needs: Not on file  Physical Activity: Not on file  Stress: Not on file  Social Connections: Not on file  Intimate Partner Violence: Not on file    Review of Systems: See HPI, otherwise negative ROS  Physical Exam: BP (!) 136/97    Pulse 75  Temp 97.9 F (36.6 C) (Temporal)    Resp 20    Ht 5\' 7"  (1.702 m)    Wt 98.4 kg    SpO2 100%    BMI 33.99 kg/m  General:   Alert,  pleasant and cooperative in NAD Head:  Normocephalic and atraumatic. Lungs:  Clear to auscultation.    Heart:  Regular rate and rhythm.   Impression/Plan: [place] Erin Campos is here for ophthalmic surgery.  Risks, benefits, limitations, and alternatives regarding ophthalmic surgery have been reviewed with the patient.  Questions have been answered.  All parties agreeable.   Leandrew Koyanagi, MD  11/16/2021, 9:36 AM]

## 2021-11-16 NOTE — Op Note (Signed)
°  OPERATIVE NOTE  Erin Campos [id number] 11/16/2021   PREOPERATIVE DIAGNOSIS:  Nuclear sclerotic cataract left eye. H25.12   POSTOPERATIVE DIAGNOSIS:    Nuclear sclerotic cataract left eye.     PROCEDURE:  Phacoemusification with posterior chamber intraocular lens placement of the left eye  Ultrasound time: Procedure(s) with comments: CATARACT EXTRACTION PHACO AND INTRAOCULAR LENS PLACEMENT (IOC) LEFT  DIABETIC 5.83 00:47.3  (Left) - sleep apnea  LENS:   Implant Name Type Inv. Item Serial No. Manufacturer Lot No. LRB No. Used Action  LENS IOL TECNIS EYHANCE 22.0 - [id number] Intraocular Lens LENS IOL TECNIS EYHANCE 22.0 [id number] [employer]  Left 1 Implanted      SURGEON:  Wyonia Hough, MD   ANESTHESIA:  Topical with tetracaine drops and 2% Xylocaine jelly, augmented with 1% preservative-free intracameral lidocaine.    COMPLICATIONS:  None.   DESCRIPTION OF PROCEDURE:  The patient was identified in the holding room and transported to the operating room and placed in the supine position under the operating microscope.  The left eye was identified as the operative eye and it was prepped and draped in the usual sterile ophthalmic fashion.   A 1 millimeter clear-corneal paracentesis was made at the 1:30 position.  0.5 ml of preservative-free 1% lidocaine was injected into the anterior chamber.  The anterior chamber was filled with Viscoat viscoelastic.  A 2.4 millimeter keratome was used to make a near-clear corneal incision at the 10:30 position.  .  A curvilinear capsulorrhexis was made with a cystotome and capsulorrhexis forceps.  Balanced salt solution was used to hydrodissect and hydrodelineate the nucleus.   Phacoemulsification was then used in stop and chop fashion to remove the lens nucleus and epinucleus.  The remaining cortex was then removed using the irrigation and aspiration handpiece. Provisc was then placed into the capsular bag to distend it for lens  placement.  A lens was then injected into the capsular bag.  The remaining viscoelastic was aspirated.   Wounds were hydrated with balanced salt solution.  The anterior chamber was inflated to a physiologic pressure with balanced salt solution.  No wound leaks were noted. Cefuroxime 0.1 ml of a 10mg /ml solution was injected into the anterior chamber for a dose of 1 mg of intracameral antibiotic at the completion of the case.   Timolol and Brimonidine drops were applied to the eye.  The patient was taken to the recovery room in stable condition without complications of anesthesia or surgery.  [name] 11/16/2021, 10:20 AM

## 2021-11-16 NOTE — Anesthesia Preprocedure Evaluation (Signed)
Anesthesia Evaluation  Patient identified by MRN, date of birth, ID band Patient awake    Reviewed: Allergy & Precautions, H&P , NPO status , Patient's Chart, lab work & pertinent test results  Airway Mallampati: II  TM Distance: >3 FB Neck ROM: full    Dental no notable dental hx.    Pulmonary sleep apnea , former smoker,    Pulmonary exam normal breath sounds clear to auscultation       Cardiovascular hypertension, + dysrhythmias Supra Ventricular Tachycardia  Rhythm:regular Rate:Normal     Neuro/Psych PSYCHIATRIC DISORDERS    GI/Hepatic GERD  ,  Endo/Other  diabetesOverweight   Renal/GU      Musculoskeletal   Abdominal   Peds  Hematology   Anesthesia Other Findings   Reproductive/Obstetrics                             Anesthesia Physical Anesthesia Plan  ASA: 3  Anesthesia Plan: MAC   Post-op Pain Management: Minimal or no pain anticipated   Induction:   PONV Risk Score and Plan: 2 and Treatment may vary due to age or medical condition, Midazolam and TIVA  Airway Management Planned:   Additional Equipment:   Intra-op Plan:   Post-operative Plan:   Informed Consent: I have reviewed the patients History and Physical, chart, labs and discussed the procedure including the risks, benefits and alternatives for the proposed anesthesia with the patient or authorized representative who has indicated his/her understanding and acceptance.     Dental Advisory Given  Plan Discussed with: CRNA  Anesthesia Plan Comments:         Anesthesia Quick Evaluation

## 2021-11-16 NOTE — Anesthesia Postprocedure Evaluation (Signed)
Anesthesia Post Note  Patient: Erin Campos  Procedure(s) Performed: CATARACT EXTRACTION PHACO AND INTRAOCULAR LENS PLACEMENT (IOC) LEFT  DIABETIC 5.83 00:47.3  (Left: Eye)     Patient location during evaluation: PACU Anesthesia Type: MAC Level of consciousness: awake and alert and oriented Pain management: satisfactory to patient Vital Signs Assessment: post-procedure vital signs reviewed and stable Respiratory status: spontaneous breathing, nonlabored ventilation and respiratory function stable Cardiovascular status: blood pressure returned to baseline and stable Postop Assessment: Adequate PO intake and No signs of nausea or vomiting Anesthetic complications: no   No notable events documented.  Raliegh Ip

## 2021-11-17 ENCOUNTER — Encounter: Payer: Self-pay | Admitting: Ophthalmology

## 2021-11-17 ENCOUNTER — Other Ambulatory Visit: Payer: Self-pay

## 2021-11-21 DIAGNOSIS — H2511 Age-related nuclear cataract, right eye: Secondary | ICD-10-CM | POA: Diagnosis not present

## 2021-11-29 NOTE — Anesthesia Preprocedure Evaluation (Addendum)
Anesthesia Evaluation  ?Patient identified by MRN, date of birth, ID band ?Patient awake ? ? ? ?Reviewed: ?NPO status  ? ?History of Anesthesia Complications ?Negative for: history of anesthetic complications ? ?Airway ?Mallampati: II ? ?TM Distance: >3 FB ?Neck ROM: full ? ? ? Dental ?no notable dental hx. ? ?  ?Pulmonary ?sleep apnea and Continuous Positive Airway Pressure Ventilation , former smoker,  ?  ?Pulmonary exam normal ? ? ? ? ? ? ? Cardiovascular ?Exercise Tolerance: Good ?hypertension, (-) Past MI Normal cardiovascular exam+ dysrhythmias (SVT s/p ablation (2015)) Supra Ventricular Tachycardia  ? ?Echocardiogram 2D complete: (12/08/2014) ?INTERPRETATION ?NORMAL LEFT VENTRICULAR SYSTOLIC FUNCTION ?NORMAL RIGHT VENTRICULAR SYSTOLIC FUNCTION ?NO VALVULAR REGURGITATION ?NO VALVULAR STENOSIS ?Normal overall left ventricular function ejection fraction between 50 and 55% ?Mild to moderate pulmonary hypertension; ? ?  ?Neuro/Psych ?Anxiety Depression RLS; ? ?Chronic back pain ?  ? GI/Hepatic ?Neg liver ROS, GERD  Controlled,  ?Endo/Other  ?neg diabetesMorbid obesity (bmi 34) ? Renal/GU ?CRFRenal disease (ckd3)  ?negative genitourinary ?  ?Musculoskeletal ? ?(+) Arthritis , Sjogren's syndrome with inflammatory arthritis; ? ?c1-c2 spnal fusion  ? Abdominal ?  ?Peds ? Hematology ?negative hematology ROS ?(+)   ?Anesthesia Other Findings ?cards cleared: 09/2021: [place]; ? ?ekg: 09/2021:  ?Normal sinus rhythm  ?Left axis deviation  ?Septal infarct (cited on or before 26-Nov-2014) ; ? ?Had ecce on 10/2021; ? ? Reproductive/Obstetrics ? ?  ? ? ? ? ? ? ? ? ? ? ? ? ? ?  ?  ? ? ? ? ? ? ? ?Anesthesia Physical ?Anesthesia Plan ? ?ASA: 3 ? ?Anesthesia Plan: MAC  ? ?Post-op Pain Management:   ? ?Induction:  ? ?PONV Risk Score and Plan: 2 and TIVA and Midazolam ? ?Airway Management Planned:  ? ?Additional Equipment:  ? ?Intra-op Plan:  ? ?Post-operative Plan:  ? ?Informed Consent: I have  reviewed the patients History and Physical, chart, labs and discussed the procedure including the risks, benefits and alternatives for the proposed anesthesia with the patient or authorized representative who has indicated his/her understanding and acceptance.  ? ? ? ? ? ?Plan Discussed with: CRNA ? ?Anesthesia Plan Comments:   ? ? ? ? ? ? ?Anesthesia Quick Evaluation                                  Anesthesia Evaluation  ?Patient identified by MRN, date of birth, ID band ?Patient awake ? ? ? ?Reviewed: ?Allergy & Precautions, H&P , NPO status , Patient's Chart, lab work & pertinent test results ? ?Airway ?Mallampati: II ? ?TM Distance: >3 FB ?Neck ROM: full ? ? ? Dental ?no notable dental hx. ? ?  ?Pulmonary ?sleep apnea , former smoker,  ?  ?Pulmonary exam normal ?breath sounds clear to auscultation ? ? ? ? ? ? Cardiovascular ?hypertension, + dysrhythmias Supra Ventricular Tachycardia  ?Rhythm:regular Rate:Normal ? ? ?  ?Neuro/Psych ?PSYCHIATRIC DISORDERS   ? GI/Hepatic ?GERD  ,  ?Endo/Other  ?diabetesOverweight ? ? Renal/GU ?  ? ?  ?Musculoskeletal ? ? Abdominal ?  ?Peds ? Hematology ?  ?Anesthesia Other Findings ? ? Reproductive/Obstetrics ? ?  ? ? ? ? ? ? ? ? ? ? ? ? ? ?  ?  ? ? ? ? ? ? ? ? ?Anesthesia Physical ?Anesthesia Plan ? ?ASA: 3 ? ?Anesthesia Plan: MAC  ? ?Post-op Pain Management: Minimal or no pain anticipated  ? ?Induction:  ? ?  PONV Risk Score and Plan: 2 and Treatment may vary due to age or medical condition, Midazolam and TIVA ? ?Airway Management Planned:  ? ?Additional Equipment:  ? ?Intra-op Plan:  ? ?Post-operative Plan:  ? ?Informed Consent: I have reviewed the patients History and Physical, chart, labs and discussed the procedure including the risks, benefits and alternatives for the proposed anesthesia with the patient or authorized representative who has indicated his/her understanding and acceptance.  ? ? ? ?Dental Advisory Given ? ?Plan Discussed with: CRNA ? ?Anesthesia Plan Comments:    ? ? ? ? ? ? ?Anesthesia Quick Evaluation ? ? ?

## 2021-11-29 NOTE — Discharge Instructions (Signed)

## 2021-11-30 ENCOUNTER — Other Ambulatory Visit: Payer: Self-pay

## 2021-11-30 ENCOUNTER — Encounter
Admission: RE | Disposition: A | Discharge status: Home / Self Care | Payer: Self-pay | Source: Home / Self Care | Attending: Ophthalmology

## 2021-11-30 ENCOUNTER — Ambulatory Visit
Admission: RE | Admit: 2021-11-30 | Discharge: 2021-11-30 | Disposition: A | Discharge status: Home / Self Care | Payer: PPO | Attending: Ophthalmology | Admitting: Ophthalmology

## 2021-11-30 ENCOUNTER — Ambulatory Visit: Payer: PPO | Admitting: Anesthesiology

## 2021-11-30 ENCOUNTER — Encounter: Payer: Self-pay | Admitting: Ophthalmology

## 2021-11-30 DIAGNOSIS — E1122 Type 2 diabetes mellitus with diabetic chronic kidney disease: Secondary | ICD-10-CM | POA: Insufficient documentation

## 2021-11-30 DIAGNOSIS — E1136 Type 2 diabetes mellitus with diabetic cataract: Secondary | ICD-10-CM | POA: Diagnosis not present

## 2021-11-30 DIAGNOSIS — Z981 Arthrodesis status: Secondary | ICD-10-CM | POA: Insufficient documentation

## 2021-11-30 DIAGNOSIS — G4730 Sleep apnea, unspecified: Secondary | ICD-10-CM | POA: Insufficient documentation

## 2021-11-30 DIAGNOSIS — K219 Gastro-esophageal reflux disease without esophagitis: Secondary | ICD-10-CM | POA: Insufficient documentation

## 2021-11-30 DIAGNOSIS — H25811 Combined forms of age-related cataract, right eye: Secondary | ICD-10-CM | POA: Diagnosis not present

## 2021-11-30 DIAGNOSIS — E669 Obesity, unspecified: Secondary | ICD-10-CM | POA: Insufficient documentation

## 2021-11-30 DIAGNOSIS — H2511 Age-related nuclear cataract, right eye: Secondary | ICD-10-CM | POA: Diagnosis not present

## 2021-11-30 DIAGNOSIS — Z87891 Personal history of nicotine dependence: Secondary | ICD-10-CM | POA: Insufficient documentation

## 2021-11-30 DIAGNOSIS — N183 Chronic kidney disease, stage 3 unspecified: Secondary | ICD-10-CM | POA: Insufficient documentation

## 2021-11-30 DIAGNOSIS — Z6833 Body mass index (BMI) 33.0-33.9, adult: Secondary | ICD-10-CM | POA: Diagnosis not present

## 2021-11-30 DIAGNOSIS — I129 Hypertensive chronic kidney disease with stage 1 through stage 4 chronic kidney disease, or unspecified chronic kidney disease: Secondary | ICD-10-CM | POA: Insufficient documentation

## 2021-11-30 HISTORY — PX: CATARACT EXTRACTION W/PHACO: SHX586

## 2021-11-30 LAB — GLUCOSE, CAPILLARY: Glucose-Capillary: 91 mg/dL (ref 70–99)

## 2021-11-30 SURGERY — PHACOEMULSIFICATION, CATARACT, WITH IOL INSERTION
Anesthesia: Monitor Anesthesia Care | Site: Eye | Laterality: Right

## 2021-11-30 MED ORDER — MIDAZOLAM HCL 2 MG/2ML IJ SOLN
INTRAMUSCULAR | Status: DC | PRN
  Administered 2021-11-30 (×2): 1 mg via INTRAVENOUS

## 2021-11-30 MED ORDER — FENTANYL CITRATE (PF) 100 MCG/2ML IJ SOLN
INTRAMUSCULAR | Status: DC | PRN
  Administered 2021-11-30: 50 ug via INTRAVENOUS

## 2021-11-30 MED ORDER — SIGHTPATH DOSE#1 BSS IO SOLN
INTRAOCULAR | Status: DC | PRN
  Administered 2021-11-30: 15 mL

## 2021-11-30 MED ORDER — LACTATED RINGERS IV SOLN: INTRAVENOUS | Status: DC

## 2021-11-30 MED ORDER — CEFUROXIME OPHTHALMIC INJECTION 1 MG/0.1 ML
INJECTION | OPHTHALMIC | Status: DC | PRN
  Administered 2021-11-30: 0.1 mL via INTRACAMERAL

## 2021-11-30 MED ORDER — SIGHTPATH DOSE#1 NA HYALUR & NA CHOND-NA HYALUR IO KIT
PACK | INTRAOCULAR | Status: DC | PRN
  Administered 2021-11-30: 1 via OPHTHALMIC

## 2021-11-30 MED ORDER — SIGHTPATH DOSE#1 BSS IO SOLN
INTRAOCULAR | Status: DC | PRN
  Administered 2021-11-30: 60 mL via OPHTHALMIC

## 2021-11-30 MED ORDER — SIGHTPATH DOSE#1 BSS IO SOLN
INTRAOCULAR | Status: DC | PRN
  Administered 2021-11-30: 1 mL via INTRAMUSCULAR

## 2021-11-30 MED ORDER — BRIMONIDINE TARTRATE-TIMOLOL 0.2-0.5 % OP SOLN
OPHTHALMIC | Status: DC | PRN
  Administered 2021-11-30: 1 [drp] via OPHTHALMIC

## 2021-11-30 MED ORDER — ARMC OPHTHALMIC DILATING DROPS
1.0000 "application " | OPHTHALMIC | Status: DC | PRN
  Administered 2021-11-30 (×3): 1 via OPHTHALMIC

## 2021-11-30 MED ORDER — TETRACAINE HCL 0.5 % OP SOLN
1.0000 [drp] | OPHTHALMIC | Status: DC | PRN
  Administered 2021-11-30 (×2): 1 [drp] via OPHTHALMIC

## 2021-11-30 SURGICAL SUPPLY — 11 items
CATARACT SUITE SIGHTPATH (MISCELLANEOUS) ×2 IMPLANT
FEE CATARACT SUITE SIGHTPATH (MISCELLANEOUS) ×1 IMPLANT
GLOVE SRG 8 PF TXTR STRL LF DI (GLOVE) ×1 IMPLANT
GLOVE SURG ENC TEXT LTX SZ7.5 (GLOVE) ×2 IMPLANT
GLOVE SURG UNDER POLY LF SZ8 (GLOVE) ×2
LENS IOL TECNIS EYHANCE 20.0 (Intraocular Lens) ×1 IMPLANT
NDL FILTER BLUNT 18X1 1/2 (NEEDLE) ×1 IMPLANT
NEEDLE FILTER BLUNT 18X 1/2SAF (NEEDLE) ×1
NEEDLE FILTER BLUNT 18X1 1/2 (NEEDLE) ×1 IMPLANT
SYR 3ML LL SCALE MARK (SYRINGE) ×2 IMPLANT
WATER STERILE IRR 250ML POUR (IV SOLUTION) ×2 IMPLANT

## 2021-11-30 NOTE — Anesthesia Procedure Notes (Signed)
Date/Time: 11/30/2021 7:58 AM ?Performed by: Cameron Ali, CRNA ?Pre-anesthesia Checklist: Patient identified, Emergency Drugs available, Suction available, Timeout performed and Patient being monitored ?Patient Re-evaluated:Patient Re-evaluated prior to induction ?Oxygen Delivery Method: Nasal cannula ?Placement Confirmation: positive ETCO2 ? ? ? ? ?

## 2021-11-30 NOTE — Anesthesia Postprocedure Evaluation (Signed)
Anesthesia Post Note ? ?Patient: Erin Campos ? ?Procedure(s) Performed: CATARACT EXTRACTION PHACO AND INTRAOCULAR LENS PLACEMENT (IOC) RIGHT DIABETIC 4.72 00:51.9 (Right: Eye) ? ? ?  ?Patient location during evaluation: PACU ?Anesthesia Type: MAC ?Level of consciousness: awake and alert ?Pain management: pain level controlled ?Vital Signs Assessment: post-procedure vital signs reviewed and stable ?Respiratory status: spontaneous breathing, nonlabored ventilation, respiratory function stable and patient connected to nasal cannula oxygen ?Cardiovascular status: stable and blood pressure returned to baseline ?Postop Assessment: no apparent nausea or vomiting ?Anesthetic complications: no ? ? ?No notable events documented. ? ?Fidel Levy ? ? ? ? ? ?

## 2021-11-30 NOTE — Transfer of Care (Signed)
Immediate Anesthesia Transfer of Care Note ? ?Patient: Erin Campos ? ?Procedure(s) Performed: CATARACT EXTRACTION PHACO AND INTRAOCULAR LENS PLACEMENT (IOC) RIGHT DIABETIC 4.72 00:51.9 (Right: Eye) ? ?Patient Location: PACU ? ?Anesthesia Type: MAC ? ?Level of Consciousness: awake, alert  and patient cooperative ? ?Airway and Oxygen Therapy: Patient Spontanous Breathing and Patient connected to supplemental oxygen ? ?Post-op Assessment: Post-op Vital signs reviewed, Patient's Cardiovascular Status Stable, Respiratory Function Stable, Patent Airway and No signs of Nausea or vomiting ? ?Post-op Vital Signs: Reviewed and stable ? ?Complications: No notable events documented. ? ?

## 2021-11-30 NOTE — H&P (Signed)
Erin Campos  ? ?Primary Care Physician:  Latanya Maudlin, NP ?Ophthalmologist: Dr. Leandrew Koyanagi ? ?Pre-Procedure History & Physical: ?HPI:  Erin Campos is a 66 y.o. female here for ophthalmic surgery. ?  ?Past Medical History:  ?Diagnosis Date  ? Anxiety   ? Arrhythmia   ? Arthritis   ? Bulging lumbar disc   ? Controlled diabetes mellitus ([place])   ? Diet controlled  ? Cystocele   ? DDD (degenerative disc disease), cervical   ? Depression   ? External hemorrhoids   ? Facial basal cell cancer   ? Fibromyalgia   ? Diagnosed in 2010  ? GERD (gastroesophageal reflux disease)   ? Heart attack ([place])   ? Resulted on EKG -2015  ? High cholesterol   ? History of kidney stones   ? HTN (hypertension)   ? Rectocele   ? Recurrent UTI   ? Sleep apnea   ? CPAP  ? 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 SVT (supraventricular tachycardia) ([place])   ? ? ?Past Surgical History:  ?Procedure Laterality Date  ? BASAL CELL CARCINOMA EXCISION    ? face  ? bladder tack  2004  ? CARDIAC CATHETERIZATION    ? CATARACT EXTRACTION W/PHACO Left 11/16/2021  ? Procedure: CATARACT EXTRACTION PHACO AND INTRAOCULAR LENS PLACEMENT ([place]) LEFT  DIABETIC 5.83 00:47.3 ;  Surgeon: Leandrew Koyanagi, MD;  Location: [facility];  Service: Ophthalmology;  Laterality: Left;  sleep apnea  ? CERVICAL FUSION  2020  ? COSMETIC SURGERY  2014  ? Lip  ? CYSTOSCOPY W/ RETROGRADES Bilateral 10/04/2015  ? Procedure: CYSTOSCOPY WITH RETROGRADE PYELOGRAM;  Surgeon: Hollice Espy, MD;  Location: [facility];  Service: Urology;  Laterality: Bilateral;  ? Heart ablation    ? KIDNEY STONE SURGERY    ? stents  ? VAGINAL HYSTERECTOMY    ? ? ?Prior to Admission medications   ?Medication Sig Start Date End Date Taking? Authorizing Provider  ?amLODipine (NORVASC) 5 MG tablet Take 5 mg by mouth daily.   Yes [provider]  ?aspirin EC 81 MG tablet Take 81 mg by mouth at bedtime.    Yes [provider]  ?atenolol (TENORMIN) 25 MG tablet Take 25 mg  by mouth 2 (two) times daily. My take a second 25 mg dose as needed for PVC 09/20/14  Yes [provider]  ?buPROPion (WELLBUTRIN XL) 300 MG 24 hr tablet Take 300 mg by mouth daily. 11/24/19  Yes [provider]  ?clonazePAM (KLONOPIN) 0.5 MG tablet Take 0.5 mg by mouth 2 (two) times daily as needed for anxiety. 04/30/18  Yes [provider]  ?Cyanocobalamin (VITAMIN B-12 IJ) Inject 1,000 mcg as directed every 28 (twenty-eight) days.   Yes [provider]  ?cyclobenzaprine (FLEXERIL) 5 MG tablet Take 5-10 mg by mouth 2 (two) times daily as needed for muscle spasms. 06/03/18  Yes [provider]  ?ergocalciferol (VITAMIN D2) 1.25 MG (50000 UT) capsule Take 50,000 Units by mouth once a week.   Yes [provider]  ?estradiol (ESTRACE) 0.1 MG/GM vaginal cream Place 1 Applicatorful vaginally every Thursday. 11/20/19  Yes Harlin Heys, MD  ?fenofibrate (TRICOR) 145 MG tablet Take 145 mg by mouth at bedtime.  08/06/14  Yes [provider]  ?KLOR-CON M20 20 MEQ tablet Take 20 mEq by mouth daily. May take a second 10 meq dose at night as needed for leg cramps 08/06/14  Yes [provider]  ?losartan (COZAAR) 50  MG tablet Take 50 mg by mouth daily. 09/01/14  Yes [provider]  ?Magnesium 500 MG CAPS Take 500 mg by mouth daily.    Yes [provider]  ?niacin 500 MG tablet Take 500 mg by mouth at bedtime.   Yes [provider]  ?nortriptyline (PAMELOR) 75 MG capsule Take 75 mg by mouth at bedtime.  09/15/14  Yes [provider]  ?omeprazole (PRILOSEC) 40 MG capsule Take 40 mg by mouth daily.   Yes [provider]  ?polyethylene glycol (MIRALAX / GLYCOLAX) packet Take 17 g by mouth daily.   Yes [provider]  ?QUERCETIN PO Take by mouth daily.   Yes [provider]  ?rOPINIRole (REQUIP) 0.5 MG tablet Take 0.5 mg by mouth at bedtime as needed (restless legs).  03/25/18  Yes [provider]  ?tapentadol (NUCYNTA) 50 MG tablet Take 75 mg by mouth every 8 (eight) hours as needed.   Yes [provider]  ?TART CHERRY PO Take by mouth daily.   Yes [provider]  ?triamterene-hydrochlorothiazide (MAXZIDE-25) 37.5-25 MG per tablet Take 1 tablet by mouth every morning. 08/06/14  Yes [provider]  ?glucose blood test strip  05/09/13   [provider]  ?naloxone (NARCAN) nasal spray 4 mg/0.1 mL Place 1 spray into the nose as needed.    [provider]  ? ? ?Allergies as of 10/11/2021 - Review Complete 11/20/2020  ?Allergen Reaction Noted  ? Ivp dye [iodinated contrast media] Anaphylaxis and Other (See Comments) 12/17/2019  ? Sulfa antibiotics Hives and Shortness Of Breath 09/27/2014  ? Demerol [meperidine] Other (See Comments) 09/27/2014  ? Influenza virus vaccine Other (See Comments) 08/24/2015  ? Nsaids Palpitations and Other (See Comments) 08/24/2015  ? Prednisone Other (See Comments) and Hypertension 09/19/2016  ? Ketorolac tromethamine Palpitations 08/24/2015  ? ? ?Family History  ?Problem Relation Age of Onset  ? Kidney Stones Father   ? Hematuria Father   ? Heart failure Father   ? Heart failure Mother   ?     Father  ? Melanoma Mother   ? Diabetes Mother   ? Diabetes Sister   ? Colon cancer Maternal Aunt   ? Breast cancer Maternal Aunt 12  ?     one aunt and one great aunt  ? Diabetes Maternal Grandmother   ? Ovarian cancer Neg Hx   ? ? ?Social History  ? ?Socioeconomic History  ? Marital status: Married  ?  Spouse name: Not on file  ? Number of children: Not on file  ? Years of education: Not on file  ? Highest education level: Not on file  ?Occupational History  ? Not on file  ?Tobacco Use  ? Smoking status: Former  ?  Packs/day: 0.25  ?  Types: Cigarettes  ?  Quit date: 2000  ?  Years since quitting: 23.1  ? Smokeless tobacco: Never  ? Tobacco comments:  ?  quit 20 years  ?Vaping Use  ? Vaping Use: Never used  ?Substance and Sexual Activity  ?  Alcohol use: No  ?  Alcohol/week: 0.0 standard drinks  ? Drug use: No  ? Sexual activity: Yes  ?  Birth control/protection: Surgical  ?Other Topics Concern  ? Not on file  ?Social History Narrative  ? Not on file  ? ?Social Determinants of Health  ? ?Financial Resource Strain: Not on file  ?Food Insecurity: Not on file  ?Transportation Needs: Not on file  ?Physical Activity:  Not on file  ?Stress: Not on file  ?Social Connections: Not on file  ?Intimate Partner Violence: Not on file  ? ? ?Review of Systems: ?See HPI, otherwise negative ROS ? ?Physical Exam: ?BP 131/85   Pulse 71   Temp 97.9 ?F (36.6 ?C) (Temporal)   Resp 16   Ht '5\' 7"'$  (1.702 m)   Wt 97.5 kg   SpO2 100%   BMI 33.67 kg/m?  ?General:   Alert,  pleasant and cooperative in NAD ?Head:  Normocephalic and atraumatic. ?Lungs:  Clear to auscultation.    ?Heart:  Regular rate and rhythm.  ? ?Impression/Plan: ?CANIYA TAGLE is here for ophthalmic surgery. ? ?Risks, benefits, limitations, and alternatives regarding ophthalmic surgery have been reviewed with the patient.  Questions have been answered.  All parties agreeable. ? ? Leandrew Koyanagi, MD  11/30/2021, 7:41 AM ? ? ?

## 2021-11-30 NOTE — Op Note (Signed)
LOCATION:  [place] ?  ?PREOPERATIVE DIAGNOSIS:    Nuclear sclerotic cataract right eye. H25.11 ?  ?POSTOPERATIVE DIAGNOSIS:  Nuclear sclerotic cataract right eye.   ?  ?PROCEDURE:  Phacoemusification with posterior chamber intraocular lens placement of the right eye  ? ?ULTRASOUND TIME: Procedure(s) with comments: ?CATARACT EXTRACTION PHACO AND INTRAOCULAR LENS PLACEMENT (IOC) RIGHT DIABETIC 4.72 00:51.9 (Right) - sleep apnea ? ?LENS:   ?Implant Name Type Inv. Item Serial No. Manufacturer Lot No. LRB No. Used Action  ?LENS IOL TECNIS EYHANCE 20.0 - [id number] Intraocular Lens LENS IOL TECNIS EYHANCE 20.0 [id number] [employer]  Right 1 Implanted  ?   ?  ?  ?SURGEON:  Wyonia Hough, MD ?  ?ANESTHESIA:  Topical with tetracaine drops and 2% Xylocaine jelly, augmented with 1% preservative-free intracameral lidocaine. ? ?  ?COMPLICATIONS:  None. ?  ?DESCRIPTION OF PROCEDURE:  The patient was identified in the holding room and transported to the operating room and placed in the supine position under the operating microscope.  The right eye was identified as the operative eye and it was prepped and draped in the usual sterile ophthalmic fashion. ?  ?A 1 millimeter clear-corneal paracentesis was made at the 12:00 position.  0.5 ml of preservative-free 1% lidocaine was injected into the anterior chamber. ?The anterior chamber was filled with Viscoat viscoelastic.  A 2.4 millimeter keratome was used to make a near-clear corneal incision at the 9:00 position.  A curvilinear capsulorrhexis was made with a cystotome and capsulorrhexis forceps.  Balanced salt solution was used to hydrodissect and hydrodelineate the nucleus. ?  ?Phacoemulsification was then used in stop and chop fashion to remove the lens nucleus and epinucleus.  The remaining cortex was then removed using the irrigation and aspiration handpiece. Provisc was then placed into the capsular bag to distend it for lens placement.  A lens was then  injected into the capsular bag.  The remaining viscoelastic was aspirated. ?  ?Wounds were hydrated with balanced salt solution.  The anterior chamber was inflated to a physiologic pressure with balanced salt solution.  No wound leaks were noted. Cefuroxime 0.1 ml of a '10mg'$ /ml solution was injected into the anterior chamber for a dose of 1 mg of intracameral antibiotic at the completion of the case. ?  Timolol and Brimonidine drops were applied to the eye.  The patient was taken to the recovery room in stable condition without complications of anesthesia or surgery. ? ? ?Socorro Ebron ?11/30/2021, 8:08 AM ? ?

## 2021-12-01 ENCOUNTER — Encounter: Payer: Self-pay | Admitting: Ophthalmology

## 2021-12-06 DIAGNOSIS — D225 Melanocytic nevi of trunk: Secondary | ICD-10-CM | POA: Diagnosis not present

## 2021-12-06 DIAGNOSIS — L738 Other specified follicular disorders: Secondary | ICD-10-CM | POA: Diagnosis not present

## 2021-12-06 DIAGNOSIS — Z85828 Personal history of other malignant neoplasm of skin: Secondary | ICD-10-CM | POA: Diagnosis not present

## 2021-12-06 DIAGNOSIS — S0502XA Injury of conjunctiva and corneal abrasion without foreign body, left eye, initial encounter: Secondary | ICD-10-CM | POA: Diagnosis not present

## 2021-12-06 DIAGNOSIS — L578 Other skin changes due to chronic exposure to nonionizing radiation: Secondary | ICD-10-CM | POA: Diagnosis not present

## 2021-12-06 DIAGNOSIS — L821 Other seborrheic keratosis: Secondary | ICD-10-CM | POA: Diagnosis not present

## 2021-12-06 DIAGNOSIS — D235 Other benign neoplasm of skin of trunk: Secondary | ICD-10-CM | POA: Diagnosis not present

## 2021-12-08 DIAGNOSIS — E538 Deficiency of other specified B group vitamins: Secondary | ICD-10-CM | POA: Diagnosis not present

## 2021-12-15 DIAGNOSIS — M47812 Spondylosis without myelopathy or radiculopathy, cervical region: Secondary | ICD-10-CM | POA: Diagnosis not present

## 2021-12-15 DIAGNOSIS — M4316 Spondylolisthesis, lumbar region: Secondary | ICD-10-CM | POA: Diagnosis not present

## 2022-01-03 DIAGNOSIS — G4733 Obstructive sleep apnea (adult) (pediatric): Secondary | ICD-10-CM | POA: Diagnosis not present

## 2022-01-04 ENCOUNTER — Other Ambulatory Visit: Payer: Self-pay | Admitting: Gerontology

## 2022-01-04 DIAGNOSIS — Z1231 Encounter for screening mammogram for malignant neoplasm of breast: Secondary | ICD-10-CM

## 2022-01-13 DIAGNOSIS — E538 Deficiency of other specified B group vitamins: Secondary | ICD-10-CM | POA: Diagnosis not present

## 2022-05-03 ENCOUNTER — Emergency Department
Admission: EM | Admit: 2022-05-03 | Discharge: 2022-05-03 | Discharge status: Against Medical Advice | Payer: PPO | Source: Home / Self Care

## 2022-05-17 ENCOUNTER — Ambulatory Visit
Admission: RE | Admit: 2022-05-17 | Discharge: 2022-05-17 | Disposition: A | Discharge status: Home / Self Care | Payer: BC Managed Care – PPO | Source: Ambulatory Visit | Attending: Gerontology | Admitting: Gerontology

## 2022-05-17 DIAGNOSIS — Z1231 Encounter for screening mammogram for malignant neoplasm of breast: Secondary | ICD-10-CM | POA: Diagnosis not present

## 2022-12-21 ENCOUNTER — Ambulatory Visit
Admission: EM | Admit: 2022-12-21 | Discharge: 2022-12-21 | Disposition: A | Discharge status: Home / Self Care | Payer: PPO

## 2022-12-21 DIAGNOSIS — J020 Streptococcal pharyngitis: Secondary | ICD-10-CM | POA: Diagnosis not present

## 2022-12-21 LAB — POCT RAPID STREP A (OFFICE): Rapid Strep A Screen: POSITIVE — AB

## 2022-12-21 MED ORDER — AMOXICILLIN 500 MG PO CAPS: 500.0000 mg | ORAL_CAPSULE | Freq: Two times a day (BID) | ORAL | 0 refills | Status: AC

## 2022-12-21 NOTE — ED Provider Notes (Signed)
[facility]    CSN: [id number] Arrival date & time: 12/21/22  1335      History   Chief Complaint Chief Complaint  Patient presents with   Sore Throat   Headache   Fever    HPI Erin Campos is a 67 y.o. female.  Patient presents with fever, chills, sore throat, headache since last night.  Negative COVID test at home.  She reports exposure to strep throat.  Tmax 102.9.  Treatment at home with Tylenol.  She denies rash, cough, shortness of breath, or other symptoms.  The history is provided by the patient and medical records.    Past Medical History:  Diagnosis Date   Anxiety    Arrhythmia    Arthritis    Bulging lumbar disc    Controlled diabetes mellitus ([place])    Diet controlled   Cystocele    DDD (degenerative disc disease), cervical    Depression    External hemorrhoids    Facial basal cell cancer    Fibromyalgia    Diagnosed in 2010   GERD (gastroesophageal reflux disease)    Heart attack ([place])    Resulted on EKG -2015   High cholesterol    History of kidney stones    HTN (hypertension)    Rectocele    Recurrent UTI    Sleep apnea    CPAP   Spleen anomaly    compressed   SVT (supraventricular tachycardia)     Patient Active Problem List   Diagnosis Date Noted   Lumbar radiculopathy 12/18/2019   Lumbosacral radiculitis 12/18/2019   Osteoarthritis of knee 12/18/2019   Primary localized osteoarthritis of pelvic region and thigh 12/18/2019   Bursitis of hip 12/18/2019   Post-operative pain    Chronic bilateral low back pain with left-sided sciatica    SVT (supraventricular tachycardia)    Diabetes mellitus type 2 in obese (HCC)    Generalized anxiety disorder    Leukocytosis    Surgery, elective    Spondylolisthesis of lumbar region 06/27/2018   Kidney stone 03/07/2017   Palpitations 03/07/2017   SOB (shortness of breath) 03/07/2017   Obstructive sleep apnea (adult) (pediatric) 08/11/2016   Supraventricular tachycardia 11/16/2015    Incomplete rectal prolapse 11/16/2015   Adrenal nodule ([place]) 09/12/2015   Bipolar I disorder ([place]) 08/26/2015   Essential (primary) hypertension 08/26/2015   Arthralgia of hip 08/26/2015   H/O disease 08/26/2015   Decreased potassium in the blood 08/26/2015   Idiopathic peripheral neuropathy 08/26/2015   Rectocele 08/26/2015   Midline cystocele 08/26/2015   Vaginal atrophy 08/26/2015   Cystocele 08/26/2015   Recurrent UTI 08/24/2015   Microscopic hematuria 08/24/2015   Atrophic vaginitis 08/24/2015   Migraine without status migrainosus, not intractable 06/25/2015   Other chronic diseases of tonsils and adenoids (CODE) 06/25/2015   Periodic limb movement disorder 06/25/2015   Symptomatic PVCs 03/19/2015   Degeneration of intervertebral disc of lumbar region 11/20/2014   Combined fat and carbohydrate induced hyperlipemia 11/20/2014   Anxiety 11/20/2014   Type 2 diabetes mellitus ([place]) 02/13/2013   Family history of thyroid disease 02/13/2013   Hypomagnesemia 02/13/2013   Degenerative arthritis of hip 02/06/2013    Past Surgical History:  Procedure Laterality Date   BASAL CELL CARCINOMA EXCISION     face   bladder tack  2004   CARDIAC CATHETERIZATION     CATARACT EXTRACTION W/PHACO Left 11/16/2021   Procedure: CATARACT EXTRACTION PHACO AND INTRAOCULAR LENS PLACEMENT (IOC) LEFT  DIABETIC 5.83  00:47.3 ;  Surgeon: Leandrew Koyanagi, MD;  Location: [facility];  Service: Ophthalmology;  Laterality: Left;  sleep apnea   CATARACT EXTRACTION W/PHACO Right 11/30/2021   Procedure: CATARACT EXTRACTION PHACO AND INTRAOCULAR LENS PLACEMENT (IOC) RIGHT DIABETIC 4.72 00:51.9;  Surgeon: Leandrew Koyanagi, MD;  Location: [facility];  Service: Ophthalmology;  Laterality: Right;  sleep apnea   CERVICAL FUSION  2020   COSMETIC SURGERY  2014   Lip   CYSTOSCOPY W/ RETROGRADES Bilateral 10/04/2015   Procedure: CYSTOSCOPY WITH RETROGRADE PYELOGRAM;  Surgeon: Hollice Espy, MD;   Location: [facility];  Service: Urology;  Laterality: Bilateral;   Heart ablation     KIDNEY STONE SURGERY     stents   VAGINAL HYSTERECTOMY      OB History     Gravida  2   Para  2   Term  2   Preterm      AB      Living  2      SAB      IAB      Ectopic      Multiple      Live Births  2            Home Medications    Prior to Admission medications   Medication Sig Start Date End Date Taking? Authorizing Provider  amoxicillin (AMOXIL) 500 MG capsule Take 1 capsule (500 mg total) by mouth 2 (two) times daily for 10 days. 12/21/22 12/31/22 Yes Sharion Balloon, NP  amLODipine (NORVASC) 5 MG tablet Take 5 mg by mouth daily.    [provider]  aspirin EC 81 MG tablet Take 81 mg by mouth at bedtime.     [provider]  atenolol (TENORMIN) 25 MG tablet Take 25 mg by mouth 2 (two) times daily. My take a second 25 mg dose as needed for PVC 09/20/14   [provider]  buPROPion (WELLBUTRIN XL) 300 MG 24 hr tablet Take 300 mg by mouth daily. 11/24/19   [provider]  clonazePAM (KLONOPIN) 0.5 MG tablet Take 0.5 mg by mouth 2 (two) times daily as needed for anxiety. 04/30/18   [provider]  Cyanocobalamin (VITAMIN B-12 IJ) Inject 1,000 mcg as directed every 28 (twenty-eight) days.    [provider]  cyclobenzaprine (FLEXERIL) 5 MG tablet Take 5-10 mg by mouth 2 (two) times daily as needed for muscle spasms. 06/03/18   [provider]  DULoxetine (CYMBALTA) 60 MG capsule Take 60 mg by mouth daily.    [provider]  ergocalciferol (VITAMIN D2) 1.25 MG (50000 UT) capsule Take 50,000 Units by mouth once a week.    [provider]  estradiol (ESTRACE) 0.1 MG/GM vaginal cream Place 1 Applicatorful vaginally every Thursday. 11/20/19   Harlin Heys, MD  fenofibrate (TRICOR) 145 MG tablet Take 145 mg by mouth at bedtime.  08/06/14   [provider]  glucose blood test strip  05/09/13    [provider]  KLOR-CON M20 20 MEQ tablet Take 20 mEq by mouth daily. May take a second 10 meq dose at night as needed for leg cramps 08/06/14   [provider]  losartan (COZAAR) 50 MG tablet Take 50 mg by mouth daily. 09/01/14   [provider]  Magnesium 500 MG CAPS Take 500 mg by mouth daily.     [provider]  naloxone [facility]) nasal spray 4 mg/0.1 mL Place 1 spray into the nose as needed.  [provider]  niacin 500 MG tablet Take 500 mg by mouth at bedtime.    [provider]  nortriptyline (PAMELOR) 75 MG capsule Take 75 mg by mouth at bedtime.  09/15/14   [provider]  omeprazole (PRILOSEC) 40 MG capsule Take 40 mg by mouth daily.    [provider]  polyethylene glycol (MIRALAX / GLYCOLAX) packet Take 17 g by mouth daily.    [provider]  QUERCETIN PO Take by mouth daily.    [provider]  rOPINIRole (REQUIP) 0.5 MG tablet Take 0.5 mg by mouth at bedtime as needed (restless legs).  03/25/18   [provider]  tapentadol (NUCYNTA) 50 MG tablet Take 75 mg by mouth every 8 (eight) hours as needed.    [provider]  TART CHERRY PO Take by mouth daily.    [provider]  triamterene-hydrochlorothiazide (MAXZIDE-25) 37.5-25 MG per tablet Take 1 tablet by mouth every morning. 08/06/14   [provider]    Family History Family History  Problem Relation Age of Onset   Kidney Stones Father    Hematuria Father    Heart failure Father    Heart failure Mother        Father   Melanoma Mother    Diabetes Mother    Diabetes Sister    Colon cancer Maternal Aunt    Breast cancer Maternal Aunt 97       one aunt and one great aunt   Diabetes Maternal Grandmother    Ovarian cancer Neg Hx     Social History Social History   Tobacco Use   Smoking status: Former    Packs/day: .25    Types: Cigarettes    Quit date: 2000    Years since quitting: 24.2    Smokeless tobacco: Never   Tobacco comments:    quit 20 years  Vaping Use   Vaping Use: Never used  Substance Use Topics   Alcohol use: No    Alcohol/week: 0.0 standard drinks of alcohol   Drug use: No     Allergies   Sulfa antibiotics, Demerol [meperidine], Nsaids, Prednisone, and Ketorolac tromethamine   Review of Systems Review of Systems  Constitutional:  Positive for chills and fever.  HENT:  Positive for sore throat. Negative for ear pain.   Respiratory:  Negative for cough and shortness of breath.   Cardiovascular:  Negative for chest pain and palpitations.  Gastrointestinal:  Negative for abdominal pain, diarrhea and vomiting.  Skin:  Negative for rash.  Neurological:  Positive for headaches.  All other systems reviewed and are negative.    Physical Exam Triage Vital Signs ED Triage Vitals  Enc Vitals Group     BP 12/21/22 1353 117/76     Pulse Rate 12/21/22 1353 75     Resp 12/21/22 1353 16     Temp 12/21/22 1353 98 F (36.7 C)     Temp src --      SpO2 12/21/22 1353 98 %     Weight 12/21/22 1352 206 lb (93.4 kg)     Height 12/21/22 1352 5\' 8"  (1.727 m)     Head Circumference --      Peak Flow --      Pain Score 12/21/22 1350 2     Pain Loc --      Pain Edu? --      Excl. in [place]? --    No data found.  Updated Vital Signs BP  117/76   Pulse 75   Temp 98 F (36.7 C)   Resp 16   Ht 5\' 8"  (1.727 m)   Wt 206 lb (93.4 kg)   SpO2 98%   BMI 31.32 kg/m   Visual Acuity Right Eye Distance:   Left Eye Distance:   Bilateral Distance:    Right Eye Near:   Left Eye Near:    Bilateral Near:     Physical Exam Vitals and nursing note reviewed.  Constitutional:      General: She is not in acute distress.    Appearance: She is well-developed. She is not ill-appearing.  HENT:     Right Ear: Tympanic membrane normal.     Left Ear: Tympanic membrane normal.     Nose: Nose normal.     Mouth/Throat:     Mouth: Mucous membranes are moist.      Pharynx: Posterior oropharyngeal erythema present.  Cardiovascular:     Rate and Rhythm: Normal rate and regular rhythm.     Heart sounds: Normal heart sounds.  Pulmonary:     Effort: Pulmonary effort is normal. No respiratory distress.     Breath sounds: Normal breath sounds.  Musculoskeletal:     Cervical back: Neck supple.  Skin:    General: Skin is warm and dry.  Neurological:     Mental Status: She is alert.  Psychiatric:        Mood and Affect: Mood normal.        Behavior: Behavior normal.      UC Treatments / Results  Labs (all labs ordered are listed, but only abnormal results are displayed) Labs Reviewed  POCT RAPID STREP A (OFFICE) - Abnormal; Notable for the following components:      Result Value   Rapid Strep A Screen Positive (*)    All other components within normal limits    EKG   Radiology No results found.  Procedures Procedures (including critical care time)  Medications Ordered in UC Medications - No data to display  Initial Impression / Assessment and Plan / UC Course  I have reviewed the triage vital signs and the nursing notes.  Pertinent labs & imaging results that were available during my care of the patient were reviewed by me and considered in my medical decision making (see chart for details).   Strep pharyngitis.  Rapid strep positive.  Treating with amoxicillin.  Tylenol or ibuprofen as needed.  Education provided on strep throat.  Instructed patient to follow up with her PCP if her symptoms are not improving.  She agrees to plan of care.   Final Clinical Impressions(s) / UC Diagnoses   Final diagnoses:  Strep pharyngitis     Discharge Instructions      Take the amoxicillin as directed for strep throat.    Follow up with your primary care provider if your symptoms are not improving.        ED Prescriptions     Medication Sig Dispense Auth. Provider   amoxicillin (AMOXIL) 500 MG capsule Take 1 capsule (500 mg total)  by mouth 2 (two) times daily for 10 days. 20 capsule Sharion Balloon, NP      PDMP not reviewed this encounter.   Sharion Balloon, NP 12/21/22 1426

## 2022-12-21 NOTE — Discharge Instructions (Addendum)
Take the amoxicillin as directed for strep throat.  Follow up with your primary care provider if your symptoms are not improving.    

## 2022-12-21 NOTE — ED Triage Notes (Signed)
Patient to Urgent Care with complaints of sore throat, headaches, and fevers/ chills. Symptoms started last night.  Negative home Covid test. Patient was exposed to strep yesterday morning.   Has been taking tylenol. Last dose 10:30 am. Max temp 102.9 last night.

## 2023-08-10 ENCOUNTER — Other Ambulatory Visit: Payer: Self-pay | Admitting: Gerontology

## 2023-08-10 DIAGNOSIS — Z1231 Encounter for screening mammogram for malignant neoplasm of breast: Secondary | ICD-10-CM

## 2023-10-04 ENCOUNTER — Ambulatory Visit
Admission: RE | Admit: 2023-10-04 | Discharge: 2023-10-04 | Disposition: A | Discharge status: Home / Self Care | Payer: PPO | Source: Ambulatory Visit | Attending: Gerontology | Admitting: Gerontology

## 2023-10-04 DIAGNOSIS — Z1231 Encounter for screening mammogram for malignant neoplasm of breast: Secondary | ICD-10-CM | POA: Insufficient documentation

## 2024-02-15 ENCOUNTER — Other Ambulatory Visit: Payer: Self-pay | Admitting: Orthopedic Surgery

## 2024-02-15 DIAGNOSIS — S92061A Displaced intraarticular fracture of right calcaneus, initial encounter for closed fracture: Secondary | ICD-10-CM

## 2024-02-22 ENCOUNTER — Ambulatory Visit
Admission: RE | Admit: 2024-02-22 | Discharge: 2024-02-22 | Disposition: A | Discharge status: Home / Self Care | Source: Ambulatory Visit | Attending: Orthopedic Surgery | Admitting: Orthopedic Surgery

## 2024-02-22 DIAGNOSIS — S92061A Displaced intraarticular fracture of right calcaneus, initial encounter for closed fracture: Secondary | ICD-10-CM | POA: Diagnosis present

## 2024-07-17 ENCOUNTER — Other Ambulatory Visit: Payer: Self-pay | Admitting: Orthopedic Surgery

## 2024-07-17 DIAGNOSIS — M960 Pseudarthrosis after fusion or arthrodesis: Secondary | ICD-10-CM

## 2024-07-23 ENCOUNTER — Ambulatory Visit
Admission: RE | Admit: 2024-07-23 | Discharge: 2024-07-23 | Disposition: A | Discharge status: Home / Self Care | Source: Ambulatory Visit | Attending: Orthopedic Surgery | Admitting: Orthopedic Surgery

## 2024-07-23 DIAGNOSIS — M960 Pseudarthrosis after fusion or arthrodesis: Secondary | ICD-10-CM | POA: Insufficient documentation

## 2024-08-11 ENCOUNTER — Encounter: Payer: Self-pay | Admitting: Emergency Medicine

## 2024-08-11 ENCOUNTER — Ambulatory Visit
Admission: EM | Admit: 2024-08-11 | Discharge: 2024-08-11 | Disposition: A | Discharge status: Home / Self Care | Attending: Emergency Medicine | Admitting: Emergency Medicine

## 2024-08-11 DIAGNOSIS — B349 Viral infection, unspecified: Secondary | ICD-10-CM

## 2024-08-11 LAB — POC COVID19/FLU A&B COMBO
Covid Antigen, POC: NEGATIVE
Influenza A Antigen, POC: NEGATIVE
Influenza B Antigen, POC: NEGATIVE

## 2024-08-11 LAB — POCT RAPID STREP A (OFFICE): Rapid Strep A Screen: NEGATIVE

## 2024-08-11 MED ORDER — LIDOCAINE VISCOUS HCL 2 % MT SOLN: 15.0000 mL | OROMUCOSAL | 0 refills | Status: AC | PRN

## 2024-08-11 MED ORDER — ALBUTEROL SULFATE HFA 108 (90 BASE) MCG/ACT IN AERS: 2.0000 | INHALATION_SPRAY | RESPIRATORY_TRACT | 0 refills | Status: AC | PRN

## 2024-08-11 MED ORDER — AZITHROMYCIN 250 MG PO TABS: 250.0000 mg | ORAL_TABLET | Freq: Every day | ORAL | 0 refills | Status: AC

## 2024-08-11 NOTE — Discharge Instructions (Signed)
 Your symptoms today are most likely being caused by a virus and should steadily improve in time it can take up to 7 to 10 days before you truly start to see a turnaround however things will get better, if no improvement by Friday you may pick up antibiotic from the pharmacy, will be available that day  COVID flu and strep testing negative  You may use inhaler taking 2 puffs every 4 hours as needed for wheezing  You may gargle and spit lidocaine  solution as needed to provide temporary relief to your throat    You can take Tylenol  and/or Ibuprofen as needed for fever reduction and pain relief.   For cough: honey 1/2 to 1 teaspoon (you can dilute the honey in water or another fluid).  You can also use guaifenesin and dextromethorphan for cough. You can use a humidifier for chest congestion and cough.  If you don't have a humidifier, you can sit in the bathroom with the hot shower running.      For sore throat: try warm salt water gargles, cepacol lozenges, throat spray, warm tea or water with lemon/honey, popsicles or ice, or OTC cold relief medicine for throat discomfort.   For congestion: take a daily anti-histamine like Zyrtec, Claritin, and a oral decongestant, such as pseudoephedrine.  You can also use Flonase 1-2 sprays in each nostril daily.   It is important to stay hydrated: drink plenty of fluids (water, gatorade/powerade/pedialyte, juices, or teas) to keep your throat moisturized and help further relieve irritation/discomfort.

## 2024-08-11 NOTE — ED Triage Notes (Signed)
 Patient reports sore throat, post nasal drip, headache and wheezing x 3 days.  Patient reports that she took Tylenol  at 12 noon with mild relief.

## 2024-08-11 NOTE — ED Provider Notes (Signed)
 CAY RALPH PELT    CSN: [id number] Arrival date & time: 08/11/24  1746      History   Chief Complaint Chief Complaint  Patient presents with   Sore Throat   Nasal Congestion   Wheezing   Headache    HPI Erin Campos is a 68 y.o. female.    Patient presents for evaluation of nasal congestion, intermittent sore throat, mild nonproductive cough and wheezing present for 3 days.  Associated headaches occurring intermittently.Wheezing primarily occurring when lying down and overnight.  Cough has improved.  Endorses that when opening the mouth throat pain radiates to behind the ears and she is felt soreness behind the eyes into the forehead.  Denies fever with temperature peaking at 99.2 with associated fatigue.  No known sick contacts.  Tolerable to food and liquids.  Has attempted Tylenol .      Past Medical History:  Diagnosis Date   Anxiety    Arrhythmia    Arthritis    Bulging lumbar disc    Controlled diabetes mellitus (HCC)    Diet controlled   Cystocele    DDD (degenerative disc disease), cervical    Depression    External hemorrhoids    Facial basal cell cancer    Fibromyalgia    Diagnosed in 2010   GERD (gastroesophageal reflux disease)    Heart attack (HCC)    Resulted on EKG -2015   High cholesterol    History of kidney stones    HTN (hypertension)    Rectocele    Recurrent UTI    Sleep apnea    CPAP   Spleen anomaly    compressed   SVT (supraventricular tachycardia)     Patient Active Problem List   Diagnosis Date Noted   Lumbar radiculopathy 12/18/2019   Lumbosacral radiculitis 12/18/2019   Osteoarthritis of knee 12/18/2019   Primary localized osteoarthritis of pelvic region and thigh 12/18/2019   Bursitis of hip 12/18/2019   Post-operative pain    Chronic bilateral low back pain with left-sided sciatica    SVT (supraventricular tachycardia)    Type 2 diabetes mellitus with obesity    Generalized anxiety disorder    Leukocytosis     Surgery, elective    Spondylolisthesis of lumbar region 06/27/2018   Kidney stone 03/07/2017   Palpitations 03/07/2017   SOB (shortness of breath) 03/07/2017   Obstructive sleep apnea (adult) (pediatric) 08/11/2016   Supraventricular tachycardia 11/16/2015   Incomplete rectal prolapse 11/16/2015   Adrenal nodule 09/12/2015   Bipolar I disorder (HCC) 08/26/2015   Essential (primary) hypertension 08/26/2015   Arthralgia of hip 08/26/2015   H/O disease 08/26/2015   Decreased potassium in the blood 08/26/2015   Idiopathic peripheral neuropathy 08/26/2015   Rectocele 08/26/2015   Midline cystocele 08/26/2015   Vaginal atrophy 08/26/2015   Cystocele 08/26/2015   Recurrent UTI 08/24/2015   Microscopic hematuria 08/24/2015   Atrophic vaginitis 08/24/2015   Migraine without status migrainosus, not intractable 06/25/2015   Other chronic diseases of tonsils and adenoids (CODE) 06/25/2015   Periodic limb movement disorder 06/25/2015   Symptomatic PVCs 03/19/2015   Degeneration of intervertebral disc of lumbar region 11/20/2014   Combined fat and carbohydrate induced hyperlipemia 11/20/2014   Anxiety 11/20/2014   Type 2 diabetes mellitus (HCC) 02/13/2013   Family history of thyroid  disease 02/13/2013   Hypomagnesemia 02/13/2013   Degenerative arthritis of hip 02/06/2013    Past Surgical History:  Procedure Laterality Date   BASAL CELL CARCINOMA EXCISION  face   bladder tack  2004   CARDIAC CATHETERIZATION     CATARACT EXTRACTION W/PHACO Left 11/16/2021   Procedure: CATARACT EXTRACTION PHACO AND INTRAOCULAR LENS PLACEMENT (IOC) LEFT  DIABETIC 5.83 00:47.3 ;  Surgeon: Mittie Gaskin, MD;  Location: [facility];  Service: Ophthalmology;  Laterality: Left;  sleep apnea   CATARACT EXTRACTION W/PHACO Right 11/30/2021   Procedure: CATARACT EXTRACTION PHACO AND INTRAOCULAR LENS PLACEMENT (IOC) RIGHT DIABETIC 4.72 00:51.9;  Surgeon: Mittie Gaskin, MD;  Location: [facility];  Service: Ophthalmology;  Laterality: Right;  sleep apnea   CERVICAL FUSION  2020   COSMETIC SURGERY  2014   Lip   CYSTOSCOPY W/ RETROGRADES Bilateral 10/04/2015   Procedure: CYSTOSCOPY WITH RETROGRADE PYELOGRAM;  Surgeon: Rosina Riis, MD;  Location: [facility];  Service: Urology;  Laterality: Bilateral;   Heart ablation     KIDNEY STONE SURGERY     stents   VAGINAL HYSTERECTOMY      OB History     Gravida  2   Para  2   Term  2   Preterm      AB      Living  2      SAB      IAB      Ectopic      Multiple      Live Births  2            Home Medications    Prior to Admission medications   Medication Sig Start Date End Date Taking? Authorizing Provider  busPIRone (BUSPAR) 10 MG tablet Take 10 mg by mouth 3 (three) times daily. 08/05/24  Yes [provider]  amLODipine (NORVASC) 5 MG tablet Take 5 mg by mouth daily.    [provider]  aspirin  EC 81 MG tablet Take 81 mg by mouth at bedtime.     [provider]  atenolol  (TENORMIN ) 25 MG tablet Take 25 mg by mouth 2 (two) times daily. My take a second 25 mg dose as needed for PVC 09/20/14   [provider]  buPROPion (WELLBUTRIN XL) 300 MG 24 hr tablet Take 300 mg by mouth daily. 11/24/19   [provider]  clonazePAM  (KLONOPIN ) 0.5 MG tablet Take 0.5 mg by mouth 2 (two) times daily as needed for anxiety. 04/30/18   [provider]  Cyanocobalamin (VITAMIN B-12 IJ) Inject 1,000 mcg as directed every 28 (twenty-eight) days.    [provider]  cyclobenzaprine  (FLEXERIL ) 5 MG tablet Take 5-10 mg by mouth 2 (two) times daily as needed for muscle spasms. 06/03/18   [provider]  DULoxetine (CYMBALTA) 60 MG capsule Take 60 mg by mouth daily.    [provider]  ergocalciferol (VITAMIN D2) 1.25 MG (50000 UT) capsule Take 50,000 Units by mouth once a week.    [provider]  estradiol  (ESTRACE ) 0.1 MG/GM vaginal cream  Place 1 Applicatorful vaginally every Thursday. 11/20/19   Janit Alm Agent, MD  fenofibrate  (TRICOR ) 145 MG tablet Take 145 mg by mouth at bedtime.  08/06/14   [provider]  glucose blood test strip  05/09/13   [provider]  KLOR-CON  M20 20 MEQ tablet Take 20 mEq by mouth daily. May take a second 10 meq dose at night as needed for leg cramps 08/06/14   [provider]  losartan  (COZAAR ) 50 MG tablet Take 50 mg by mouth daily. 09/01/14   [provider]  Magnesium  500 MG CAPS Take  500 mg by mouth daily.     [provider]  naloxone  (NARCAN ) nasal spray 4 mg/0.1 mL Place 1 spray into the nose as needed.    [provider]  niacin  500 MG tablet Take 500 mg by mouth at bedtime.    [provider]  nortriptyline  (PAMELOR ) 75 MG capsule Take 75 mg by mouth at bedtime.  09/15/14   [provider]  omeprazole (PRILOSEC) 40 MG capsule Take 40 mg by mouth daily.    [provider]  polyethylene glycol (MIRALAX  / GLYCOLAX ) packet Take 17 g by mouth daily.    [provider]  QUERCETIN PO Take by mouth daily.    [provider]  rOPINIRole  (REQUIP ) 0.5 MG tablet Take 0.5 mg by mouth at bedtime as needed (restless legs).  03/25/18   [provider]  tapentadol (NUCYNTA) 50 MG tablet Take 75 mg by mouth every 8 (eight) hours as needed.    [provider]  TART CHERRY PO Take by mouth daily.    [provider]  triamterene -hydrochlorothiazide  (MAXZIDE -25) 37.5-25 MG per tablet Take 1 tablet by mouth every morning. 08/06/14   [provider]    Family History Family History  Problem Relation Age of Onset   Kidney Stones Father    Hematuria Father    Heart failure Father    Heart failure Mother        Father   Melanoma Mother    Diabetes Mother    Diabetes Sister    Colon cancer Maternal Aunt    Breast cancer Maternal Aunt 62       one aunt and one great aunt    Diabetes Maternal Grandmother    Ovarian cancer Neg Hx     Social History Social History   Tobacco Use   Smoking status: Former    Current packs/day: 0.00    Types: Cigarettes    Quit date: 2000    Years since quitting: 25.8   Smokeless tobacco: Never   Tobacco comments:    quit 20 years  Vaping Use   Vaping status: Never Used  Substance Use Topics   Alcohol use: No    Alcohol/week: 0.0 standard drinks of alcohol   Drug use: No     Allergies   Sulfa antibiotics, Demerol [meperidine], Nsaids, Prednisone, and Ketorolac  tromethamine    Review of Systems Review of Systems  Constitutional: Negative.   HENT:  Positive for congestion and sore throat. Negative for dental problem, drooling, ear discharge, ear pain, facial swelling, hearing loss, mouth sores, nosebleeds, postnasal drip, rhinorrhea, sinus pressure, sinus pain, sneezing, tinnitus, trouble swallowing and voice change.   Respiratory:  Positive for cough and wheezing. Negative for apnea, choking, chest tightness, shortness of breath and stridor.   Gastrointestinal: Negative.   Neurological:  Positive for headaches. Negative for dizziness, tremors, seizures, syncope, facial asymmetry, speech difficulty, weakness, light-headedness and numbness.     Physical Exam Triage Vital Signs ED Triage Vitals  Encounter Vitals Group     BP 08/11/24 1752 126/79     Girls Systolic BP Percentile --      Girls Diastolic BP Percentile --      Boys Systolic BP Percentile --      Boys Diastolic BP Percentile --      Pulse Rate 08/11/24 1752 79     Resp 08/11/24 1752 20     Temp 08/11/24 1752 97.7 F (36.5 C)     Temp Source 08/11/24 1752  Oral     SpO2 08/11/24 1752 96 %     Weight --      Height --      Head Circumference --      Peak Flow --      Pain Score 08/11/24 1756 9     Pain Loc --      Pain Education --      Exclude from Growth Chart --    No data found.  Updated Vital Signs BP 126/79 (BP Location: Right Arm)    Pulse 79   Temp 97.7 F (36.5 C) (Oral)   Resp 20   SpO2 96%   Visual Acuity Right Eye Distance:   Left Eye Distance:   Bilateral Distance:    Right Eye Near:   Left Eye Near:    Bilateral Near:     Physical Exam Constitutional:      Appearance: Normal appearance.  HENT:     Head: Normocephalic.     Right Ear: Tympanic membrane, ear canal and external ear normal.     Left Ear: Tympanic membrane, ear canal and external ear normal.     Nose: Congestion present.     Mouth/Throat:     Mouth: Mucous membranes are moist.     Pharynx: Oropharynx is clear. Posterior oropharyngeal erythema present. No oropharyngeal exudate.  Eyes:     Extraocular Movements: Extraocular movements intact.  Cardiovascular:     Rate and Rhythm: Normal rate and regular rhythm.     Pulses: Normal pulses.     Heart sounds: Normal heart sounds.  Pulmonary:     Effort: Pulmonary effort is normal.     Breath sounds: Normal breath sounds.  Musculoskeletal:     Cervical back: Normal range of motion and neck supple.  Neurological:     Mental Status: She is alert and oriented to person, place, and time. Mental status is at baseline.      UC Treatments / Results  Labs (all labs ordered are listed, but only abnormal results are displayed) Labs Reviewed  POC COVID19/FLU A&B COMBO    EKG   Radiology No results found.  Procedures Procedures (including critical care time)  Medications Ordered in UC Medications - No data to display  Initial Impression / Assessment and Plan / UC Course  I have reviewed the triage vital signs and the nursing notes.  Pertinent labs & imaging results that were available during my care of the patient were reviewed by me and considered in my medical decision making (see chart for details).  Viral illness  Patient is in no signs of distress nor toxic appearing.  Vital signs are stable.  Low suspicion for pneumonia, pneumothorax or bronchitis and therefore will defer  imaging.  COVID flu and strep testing negative.  Prescribed albuterol and viscous lidocaine , watch and wait antibiotic placed at pharmacy.May use additional over-the-counter medications as needed for supportive care.  May follow-up with urgent care as needed if symptoms persist or worsen.    Final Clinical Impressions(s) / UC Diagnoses   Final diagnoses:  Viral illness   Discharge Instructions   None    ED Prescriptions   None    PDMP not reviewed this encounter.   Teresa Shelba SAUNDERS, NP 08/11/24 [phone]
